# Patient Record
Sex: Male | Born: 1950 | Race: White | Hispanic: No | Marital: Married | Smoking: Never smoker
Health system: Southern US, Community
[De-identification: ages and names within clinical notes are randomized; demographics above are authoritative.]

## PROBLEM LIST (undated history)

## (undated) DIAGNOSIS — Z923 Personal history of irradiation: Secondary | ICD-10-CM

## (undated) DIAGNOSIS — R945 Abnormal results of liver function studies: Secondary | ICD-10-CM

## (undated) DIAGNOSIS — Z9109 Other allergy status, other than to drugs and biological substances: Secondary | ICD-10-CM

## (undated) DIAGNOSIS — C787 Secondary malignant neoplasm of liver and intrahepatic bile duct: Principal | ICD-10-CM

## (undated) DIAGNOSIS — L570 Actinic keratosis: Secondary | ICD-10-CM

## (undated) DIAGNOSIS — C259 Malignant neoplasm of pancreas, unspecified: Secondary | ICD-10-CM

## (undated) DIAGNOSIS — J84112 Idiopathic pulmonary fibrosis: Secondary | ICD-10-CM

## (undated) DIAGNOSIS — R7989 Other specified abnormal findings of blood chemistry: Secondary | ICD-10-CM

## (undated) DIAGNOSIS — M1990 Unspecified osteoarthritis, unspecified site: Secondary | ICD-10-CM

## (undated) DIAGNOSIS — N529 Male erectile dysfunction, unspecified: Secondary | ICD-10-CM

## (undated) DIAGNOSIS — K838 Other specified diseases of biliary tract: Secondary | ICD-10-CM

## (undated) HISTORY — DX: Idiopathic pulmonary fibrosis: J84.112

## (undated) HISTORY — DX: Actinic keratosis: L57.0

## (undated) HISTORY — DX: Malignant neoplasm of pancreas, unspecified: C25.9

## (undated) HISTORY — DX: Other allergy status, other than to drugs and biological substances: Z91.09

## (undated) HISTORY — DX: Abnormal results of liver function studies: R94.5

## (undated) HISTORY — DX: Other specified abnormal findings of blood chemistry: R79.89

## (undated) HISTORY — DX: Other specified diseases of biliary tract: K83.8

## (undated) HISTORY — PX: KNEE ARTHROSCOPY: SHX127

## (undated) HISTORY — DX: Unspecified osteoarthritis, unspecified site: M19.90

## (undated) HISTORY — PX: OTHER SURGICAL HISTORY: SHX169

## (undated) HISTORY — DX: Male erectile dysfunction, unspecified: N52.9

## (undated) HISTORY — DX: Secondary malignant neoplasm of liver and intrahepatic bile duct: C78.7

---

## 2012-05-19 ENCOUNTER — Ambulatory Visit: Payer: Self-pay | Admitting: Family Medicine

## 2012-05-26 ENCOUNTER — Ambulatory Visit: Payer: Self-pay | Admitting: Family Medicine

## 2012-06-02 ENCOUNTER — Ambulatory Visit: Payer: Self-pay | Admitting: Family Medicine

## 2012-06-22 ENCOUNTER — Ambulatory Visit (INDEPENDENT_AMBULATORY_CARE_PROVIDER_SITE_OTHER): Payer: 59 | Admitting: Emergency Medicine

## 2012-06-22 ENCOUNTER — Encounter: Payer: Self-pay | Admitting: Emergency Medicine

## 2012-06-22 ENCOUNTER — Other Ambulatory Visit: Payer: 59

## 2012-06-22 VITALS — BP 124/86 | HR 64 | Temp 97.6°F | Ht 70.0 in

## 2012-06-22 DIAGNOSIS — J8410 Pulmonary fibrosis, unspecified: Secondary | ICD-10-CM

## 2012-06-22 DIAGNOSIS — J849 Interstitial pulmonary disease, unspecified: Secondary | ICD-10-CM

## 2012-06-22 DIAGNOSIS — R0989 Other specified symptoms and signs involving the circulatory and respiratory systems: Secondary | ICD-10-CM

## 2012-06-22 NOTE — Progress Notes (Signed)
Subjective:    Patient ID: Roy English, male    DOB: 16-Apr-1951, 61 y.o.   MRN: [id number]  HPI 61 yo man, never smoker, little PMH except hx allergies. TDI exposure at work in March '13, treated for bronchitis.  He was treated for PNA in April '13 at an urgent care. Referred by Dr Ladona Ridgel from [facility] for abnormal CT scan of the chest. He had CXR at regular visit to his PCP in 10/13, no real sx, was told that he had PNA and sent for CT scan 06/02/12. He has had DOE since the exposure and original bronchitis. No real cough, f/c, etc. >> he did have infectious sx when he had the original PNA in April   Review of Systems  Constitutional: Negative for fever and unexpected weight change.  HENT: Positive for sore throat and postnasal drip. Negative for ear pain, nosebleeds, congestion, rhinorrhea, sneezing, trouble swallowing, dental problem and sinus pressure.   Eyes: Negative for redness and itching.  Respiratory: Positive for cough and shortness of breath. Negative for chest tightness and wheezing.   Cardiovascular: Negative for palpitations and leg swelling.  Gastrointestinal: Negative for nausea and vomiting.  Genitourinary: Negative for dysuria.  Musculoskeletal: Negative for joint swelling.  Skin: Negative for rash.  Neurological: Negative for headaches.  Hematological: Does not bruise/bleed easily.  Psychiatric/Behavioral: Negative for dysphoric mood. The patient is not nervous/anxious.    Past Medical History  Diagnosis Date  . Environmental allergies      Family History  Problem Relation Age of Onset  . Heart disease Mother   . Rheum arthritis Mother      History   Social History  . Marital Status: Unknown    Spouse Name: N/A    Number of Children: 2  . Years of Education: N/A   Occupational History  . 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 His most significant exposure is to TDI  through current job.  Used race dirt bikes. No [employer].   Social History Main Topics  . Smoking status: Never Smoker   . Smokeless tobacco: Never Used  . Alcohol Use: Yes     Comment: 1 beer daily  . Drug Use: No  . Sexually Active: Not on file   Other Topics Concern  . Not on file   Social History Narrative  . No narrative on file     Allergies  Allergen Reactions  . Penicillins      No outpatient prescriptions prior to visit.        Objective:   Physical Exam Filed Vitals:   06/22/12 1443  BP: 124/86  Pulse: 64  Temp: 97.6 F (36.4 C)   Gen: Pleasant, well-nourished, in no distress,  normal affect  ENT: No lesions,  mouth clear,  oropharynx clear, no postnasal drip  Neck: No JVD, no TMG, no carotid bruits  Lungs: No use of accessory muscles, no dullness to percussion, clear without rales or rhonchi  Cardiovascular: RRR, heart sounds normal, no murmur or gallops, no peripheral edema  Abdomen: soft and NT, no HSM,  BS normal  Musculoskeletal: No deformities, no cyanosis or clubbing  Neuro: alert, non focal  Skin: Warm, no lesions or rashes      Assessment & Plan:  Dyspnea on exertion Etiology unclear, but consider evolving pneumonitis from occupational exposure, asthma-like syndrome from exposure. Need to review the PFT and his films. Will  obtain these and follow up next available.   Interstitial lung disease I do not have the CT scan at this time, will obtain it. Report indicates some possible atx vs infiltrate vs interstiaial disease. Probably also consider dynamic hyperinflation. Will obtain the films and determine when to repeat. ? Whether this reflects pneumonitis from occupational exposure, consider residual infiltrate from his prior PNA - auto-immune labs today

## 2012-06-22 NOTE — Assessment & Plan Note (Signed)
Etiology unclear, but consider evolving pneumonitis from occupational exposure, asthma-like syndrome from exposure. Need to review the PFT and his films. Will obtain these and follow up next available.

## 2012-06-22 NOTE — Assessment & Plan Note (Addendum)
I do not have the CT scan at this time, will obtain it. Report indicates some possible atx vs infiltrate vs interstiaial disease. Probably also consider dynamic hyperinflation. Will obtain the films and determine when to repeat. ? Whether this reflects pneumonitis from occupational exposure, consider residual infiltrate from his prior PNA - auto-immune labs today

## 2012-06-22 NOTE — Patient Instructions (Addendum)
We will perform walking oximetry today We will obtain copies of all your CT scans, breathing tests and CXR's to review We will perform full pulmonary function testing at your next office visit Blood work today Follow with Dr Delton Coombes next available with full PFT

## 2012-06-23 ENCOUNTER — Encounter: Payer: Self-pay | Admitting: Emergency Medicine

## 2012-06-23 LAB — ANA: Anti Nuclear Antibody(ANA): NEGATIVE

## 2012-06-23 LAB — ANTI-SCLERODERMA ANTIBODY: Scleroderma (Scl-70) (ENA) Antibody, IgG: 2 AU/mL (ref <30)

## 2012-06-23 NOTE — Progress Notes (Signed)
Quick Note:  ATC no answer, LMOMTCB x1 ______

## 2012-06-26 ENCOUNTER — Telehealth: Payer: Self-pay | Admitting: Emergency Medicine

## 2012-06-26 NOTE — Telephone Encounter (Signed)
Notes Recorded by Leslye Peer, MD on 06/23/2012 at 11:50 AM Please let patient know that not all of his labs are back yet, but that the Rheumatoid Factor is elevated. We can discuss the significance of this further at his ROV. Thanks  I spoke with patient about results and he verbalized understanding and had no questions

## 2012-07-12 ENCOUNTER — Ambulatory Visit (INDEPENDENT_AMBULATORY_CARE_PROVIDER_SITE_OTHER): Payer: 59 | Admitting: Emergency Medicine

## 2012-07-12 ENCOUNTER — Encounter: Payer: Self-pay | Admitting: Emergency Medicine

## 2012-07-12 DIAGNOSIS — J849 Interstitial pulmonary disease, unspecified: Secondary | ICD-10-CM

## 2012-07-12 DIAGNOSIS — J8410 Pulmonary fibrosis, unspecified: Secondary | ICD-10-CM

## 2012-07-12 LAB — PULMONARY FUNCTION TEST

## 2012-07-12 NOTE — Progress Notes (Signed)
PFT done today. 

## 2012-07-27 ENCOUNTER — Ambulatory Visit (INDEPENDENT_AMBULATORY_CARE_PROVIDER_SITE_OTHER): Payer: 59 | Admitting: Emergency Medicine

## 2012-07-27 ENCOUNTER — Encounter: Payer: Self-pay | Admitting: Emergency Medicine

## 2012-07-27 VITALS — BP 116/78 | HR 77 | Temp 97.7°F | Ht 70.0 in | Wt 199.4 lb

## 2012-07-27 DIAGNOSIS — J849 Interstitial pulmonary disease, unspecified: Secondary | ICD-10-CM

## 2012-07-27 DIAGNOSIS — J8410 Pulmonary fibrosis, unspecified: Secondary | ICD-10-CM

## 2012-07-27 NOTE — Progress Notes (Signed)
  Subjective:    Patient ID: Roy English, male    DOB: 1951-01-06, 61 y.o.   MRN: [id number]  HPI 61 yo man, never smoker, little PMH except hx allergies. TDI exposure at work in March '13, treated for bronchitis.  He was treated for PNA in April '13 at an urgent care. Referred by Dr Ladona Ridgel from [facility] for abnormal CT scan of the chest. He had CXR at regular visit to his PCP in 10/13, no real sx, was told that he had PNA and sent for CT scan 06/02/12. He has had DOE since the exposure and original bronchitis. No real cough, f/c, etc. >> he did have infectious sx when he had the original PNA in April  ROV 07/27/12 -- returns for dyspnea and abnormal CT scan with exposure hx as above. Underwent PFT 12/11 >> primarily restriction with possible mixed disease, decreased DLCO that corrects for Va. His RF was elevated at 49, other inflammatory markers negative.    PULMONARY FUNCTON TEST 07/12/2012  FVC 2.67  FEV1 2  FEV1/FVC 74.9  FVC  % Predicted 58  FEV % Predicted 61  FeF 25-75 1.51  FeF 25-75 % Predicted 3.1       Objective:   Physical Exam Filed Vitals:   07/27/12 1454  BP: 116/78  Pulse: 77  Temp: 97.7 F (36.5 C)   Gen: Pleasant, well-nourished, in no distress,  normal affect  ENT: No lesions,  mouth clear,  oropharynx clear, no postnasal drip  Neck: No JVD, no TMG, no carotid bruits  Lungs: No use of accessory muscles, fine L crackles, more obvious crackles at R base, no wheezes  Cardiovascular: RRR, heart sounds normal, no murmur or gallops, no peripheral edema  Musculoskeletal: No deformities, no cyanosis or clubbing  Neuro: alert, non focal  Skin: Warm, no lesions or rashes      Assessment & Plan:  Interstitial lung disease PFT's show primarily restriction. Suspect that his ILD is the cause of his dyspnea. ? The cause >> RF is positive, also had occupational exposure to TDI.  - repeat Ct scan chest now - wil likely decide to proceed with  therapeutic trial pred next visit - consider rheum referral - consider VATS bx - rov next available

## 2012-07-27 NOTE — Patient Instructions (Addendum)
We will perform a CT scan of your chest  Follow with Dr Delton Coombes next available after the CT scan is done

## 2012-07-27 NOTE — Assessment & Plan Note (Signed)
PFT's show primarily restriction. Suspect that his ILD is the cause of his dyspnea. ? The cause >> RF is positive, also had occupational exposure to TDI.  - repeat Ct scan chest now - wil likely decide to proceed with therapeutic trial pred next visit - consider rheum referral - consider VATS bx - rov next available

## 2012-07-28 ENCOUNTER — Ambulatory Visit (INDEPENDENT_AMBULATORY_CARE_PROVIDER_SITE_OTHER)
Admission: RE | Admit: 2012-07-28 | Discharge: 2012-07-28 | Disposition: A | Discharge status: Home / Self Care | Payer: 59 | Source: Ambulatory Visit | Attending: Emergency Medicine | Admitting: Emergency Medicine

## 2012-07-28 DIAGNOSIS — J849 Interstitial pulmonary disease, unspecified: Secondary | ICD-10-CM

## 2012-07-28 DIAGNOSIS — J8410 Pulmonary fibrosis, unspecified: Secondary | ICD-10-CM

## 2012-08-01 ENCOUNTER — Other Ambulatory Visit: Payer: Self-pay | Admitting: *Deleted

## 2012-08-01 MED ORDER — PREDNISONE 10 MG PO TABS: ORAL_TABLET | ORAL | Status: DC

## 2012-08-01 NOTE — Telephone Encounter (Signed)
Pt aware pred has been sent to verified pharmacy. Nothing further needed at this time.

## 2012-08-01 NOTE — Progress Notes (Signed)
Quick Note:  Spoke with patient, informed him of results and recs as listed below per RB. Medication called in to [facility] [address] [address] and pt has appt schedule for Aug 17, 2012. ______

## 2012-08-03 ENCOUNTER — Telehealth: Payer: Self-pay | Admitting: Emergency Medicine

## 2012-08-03 NOTE — Telephone Encounter (Signed)
If he has already had one, he doesn';t need another until age 62. If he hasn't had one, then you can call in the script to the pharmacy.

## 2012-08-03 NOTE — Telephone Encounter (Signed)
LMTCB

## 2012-08-03 NOTE — Telephone Encounter (Signed)
Called left detailed message on named voice mail informing pt of RB's response as stated below.  Asked to please call back to verify whether or not he's ever had a pneumonia vaccine; if not, we'll be happy to send in this rx.

## 2012-08-03 NOTE — Telephone Encounter (Signed)
Returning call can be reached at [phone].Roy English

## 2012-08-03 NOTE — Telephone Encounter (Signed)
Pt is requesting an RX be sent to his pharmacy for pneumonia vaccine. Please advise if ok to order. Carron Curie, CMA

## 2012-08-07 MED ORDER — PNEUMOCOCCAL VAC POLYVALENT 25 MCG/0.5ML IJ INJ: 0.5000 mL | INJECTION | Freq: Once | INTRAMUSCULAR | Status: DC

## 2012-08-07 NOTE — Telephone Encounter (Signed)
Pt states he has never received a pneumonia vaccine and would like this sent to his pharmacy. RX has been sent.

## 2012-08-17 ENCOUNTER — Ambulatory Visit (INDEPENDENT_AMBULATORY_CARE_PROVIDER_SITE_OTHER): Payer: 59 | Admitting: Emergency Medicine

## 2012-08-17 ENCOUNTER — Encounter: Payer: Self-pay | Admitting: Emergency Medicine

## 2012-08-17 VITALS — BP 118/72 | HR 70 | Temp 97.3°F | Ht 70.0 in | Wt 201.6 lb

## 2012-08-17 DIAGNOSIS — J849 Interstitial pulmonary disease, unspecified: Secondary | ICD-10-CM

## 2012-08-17 DIAGNOSIS — J8410 Pulmonary fibrosis, unspecified: Secondary | ICD-10-CM

## 2012-08-17 MED ORDER — PREDNISONE 20 MG PO TABS: 20.0000 mg | ORAL_TABLET | Freq: Every day | ORAL | Status: DC

## 2012-08-17 NOTE — Assessment & Plan Note (Addendum)
Etiology unclear, but he seems to be clinically reversible. His RF is positive and RA runs in his family.  - will continue pred 40 x 4 weeks, then wean quickly to zero.  - rov with CXR in 58month - will needs CT scan to look for interval improvement at some point - consider rheum referral

## 2012-08-17 NOTE — Progress Notes (Signed)
Subjective:    Patient ID: Roy English, male    DOB: Jan 07, 1951, 62 y.o.   MRN: [id number]  HPI 62 yo man, never smoker, little PMH except hx allergies. TDI exposure at work in March '13, treated for bronchitis.  He was treated for PNA in April '13 at an urgent care. Referred by Dr Ladona Ridgel from [facility] for abnormal CT scan of the chest. He had CXR at regular visit to his PCP in 10/13, no real sx, was told that he had PNA and sent for CT scan 06/02/12. He has had DOE since the exposure and original bronchitis. No real cough, f/c, etc. >> he did have infectious sx when he had the original PNA in April  ROV 07/27/12 -- returns for dyspnea and abnormal CT scan with exposure hx as above. Underwent PFT 12/11 >> primarily restriction with possible mixed disease, decreased DLCO that corrects for Va. His RF was elevated at 49, other inflammatory markers negative.    PULMONARY FUNCTON TEST 07/12/2012  FVC 2.67  FEV1 2  FEV1/FVC 74.9  FVC  % Predicted 58  FEV % Predicted 61  FeF 25-75 1.51  FeF 25-75 % Predicted 3.1    ROV 08/17/12 -- f/u for SOB, ILD with positive RF. We repeated CT scan on 12/27, decided based on results to start Pred 40mg . He returns today reporting that his breathing is better, able to exercise more. He has a lot more energy.      Objective:   Physical Exam Filed Vitals:   08/17/12 1442  BP: 118/72  Pulse: 70  Temp: 97.3 F (36.3 C)   Gen: Pleasant, well-nourished, in no distress,  normal affect  ENT: No lesions,  mouth clear,  oropharynx clear, no postnasal drip  Neck: No JVD, no TMG, no carotid bruits  Lungs: No use of accessory muscles, fine L crackles, more obvious crackles at R base, no wheezes  Cardiovascular: RRR, heart sounds normal, no murmur or gallops, no peripheral edema  Musculoskeletal: No deformities, no cyanosis or clubbing  Neuro: alert, non focal  Skin: Warm, no lesions or rashes    CT scan 08/01/12 --  Comparison: No  priors.  Findings:  Mediastinum: Heart size is borderline enlarged. There is no  significant pericardial fluid, thickening or pericardial  calcification. There is atherosclerosis of the thoracic aorta, the  great vessels of the mediastinum and the coronary arteries,  including calcified atherosclerotic plaque in the left main, left  anterior descending, left circumflex and right coronary arteries.  No pathologically enlarged mediastinal or hilar lymph nodes. Please  note that accurate exclusion of hilar adenopathy is limited on  noncontrast CT scans. Esophagus is unremarkable in appearance.  Lungs/Pleura: There are patchy areas of peripheral predominant  ground-glass attenuation seen scattered throughout the lungs  bilaterally, which are basilar predominant with the exception of an  area of involvement in the periphery of the right upper lobe.  These areas are associated with peribronchovascular interstitial  thickening. There is a small amount of associated subpleural  reticulation, however, no frank honeycombing is identified. Mild  diffuse bronchial wall thickening, but no definite traction  bronchiectasis is noted. No acute consolidative airspace disease.  No definite suspicious appearing pulmonary nodules or masses. No  pleural effusions. Inspiratory and expiratory high resolution  imaging is unremarkable.  Upper Abdomen: Unremarkable.  Musculoskeletal: Multiple old healed posterior right-sided rib  fractures are noted. There are no aggressive appearing lytic or  blastic lesions noted in the visualized  portions of the skeleton.  IMPRESSION:  1. 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  2. Atherosclerosis, including left main and three-vessel coronary  artery disease. Please note that although the presence of coronary  artery  calcium documents the presence of coronary artery disease,  the severity of this disease and any potential stenosis cannot be  assessed on this non-gated CT examination. Assessment for  potential risk factor modification, dietary therapy or  pharmacologic therapy may be warranted, if clinically indicated.      Assessment & Plan:  Interstitial lung disease Etiology unclear, but he seems to be clinically reversible. His RF is positive and RA runs in his family.  - will continue pred 40 x 4 weeks, then wean quickly to zero.  - rov with CXR in 29month - will needs CT scan to look for interval improvement at some point - consider rheum referral

## 2012-08-17 NOTE — Addendum Note (Signed)
Addended by: Orma Flaming D on: 08/17/2012 03:28 PM   Modules accepted: Orders

## 2012-08-17 NOTE — Patient Instructions (Addendum)
Please continue your prednisone 40mg  for a total of 4 weeks, then decrease to 20mg  for 10 days, then stop.  Please follow up with Dr Delton Coombes in 1 month with a CXR

## 2012-09-04 ENCOUNTER — Telehealth: Payer: Self-pay | Admitting: Emergency Medicine

## 2012-09-04 NOTE — Telephone Encounter (Signed)
Called, spoke with pt. C/o catching a "cold" and generally "not feeling good."  Reports prod cough with clear to yellow mucus and increased SOB yesterday.  Denies wheezing, chest tightness, chest pain, f/c/s, or body aches.  Took mucinex dm yesterday but felt "nervous and shaky" after taking it.  Started prednisone at 20 mg qd today x 10 days.  Was last seen by RB on 08/17/12 and asked to f/u in 1 month with cxr.  He has a pending OV with RB on 09/14/12 at 2:30.  Offered OV today but would like to wait if possible for OV next week.  Would like recs in the meantime. Dr. Delton Coombes, pls advise.  Thank you.   [facility] [place]  Allergies verified with pt: Allergies  Allergen Reactions  . Penicillins

## 2012-09-04 NOTE — Telephone Encounter (Signed)
Pt is aware of RB recs. He will cal [place] on Wednesday to let us know how he is feeling.

## 2012-09-04 NOTE — Telephone Encounter (Signed)
Ask him to continue the pred 20 for now, follow his symptoms for 1-2 more days. If he continues to feel bad then I would like to see him early. Have him call us back to report. He can use other decongestants if the ones he has tried give him side effects. Try generic that contain chlorpheniramine or brompheniramine

## 2012-09-06 ENCOUNTER — Telehealth: Payer: Self-pay | Admitting: Emergency Medicine

## 2012-09-06 ENCOUNTER — Ambulatory Visit (INDEPENDENT_AMBULATORY_CARE_PROVIDER_SITE_OTHER): Payer: 59 | Admitting: Internal Medicine

## 2012-09-06 ENCOUNTER — Encounter: Payer: Self-pay | Admitting: Internal Medicine

## 2012-09-06 VITALS — BP 120/78 | HR 73 | Temp 98.1°F | Ht 70.0 in | Wt 201.4 lb

## 2012-09-06 DIAGNOSIS — J069 Acute upper respiratory infection, unspecified: Secondary | ICD-10-CM

## 2012-09-06 DIAGNOSIS — J849 Interstitial pulmonary disease, unspecified: Secondary | ICD-10-CM

## 2012-09-06 DIAGNOSIS — J8410 Pulmonary fibrosis, unspecified: Secondary | ICD-10-CM

## 2012-09-06 MED ORDER — TRAMADOL HCL 50 MG PO TABS: ORAL_TABLET | ORAL | Status: DC

## 2012-09-06 NOTE — Telephone Encounter (Signed)
I spoke with the pt and he states that he is feeling worse since he called in on 2-314. He is having a productive cough with yellow phlegm, headache, fever 101.5. He was advised on 09-04-12 to continue prednisone at 20mg  and to take chlorpheniramine as well. Pt states he has done this but he feels worse.  Per recs from phone call on 09-04-12 Dr. Delton Coombes advised the pt to call back in 1-2 days and if no better to be seen sooner then schedule visit. Appt set for today at 3:45 with Dr. Sherene Sires. Carron Curie, CMA

## 2012-09-06 NOTE — Patient Instructions (Signed)
Stay on 20 prednisone per day for now Take avelox 400 mg one now and daily for 7 days total For cough plain mucinex or robitussin and supplement with tramdol 50 mg one every 4 hours   Keep appt with Dr Rybum

## 2012-09-06 NOTE — Progress Notes (Signed)
Subjective:    Patient ID: Roy English, male    DOB: 1951-07-08    MRN: [id number]  HPI 89 yowm never smoker, little PMH except hx allergies. TDI exposure at work in March '13, treated for bronchitis.  He was treated for PNA in April '13 at an urgent care. Referred by Dr Ladona Ridgel from [facility] for abnormal CT scan of the chest. He had CXR at regular visit to his PCP in 10/13, no real sx, was told that he had PNA and sent for CT scan 06/02/12. He has had DOE since the exposure and original bronchitis. No real cough, f/c, etc. >> he did have infectious sx when he had the original PNA in April  ROV 07/27/12 -- returns for dyspnea and abnormal CT scan with exposure hx as above. Underwent PFT 12/11 >> primarily restriction with possible mixed disease, decreased DLCO that corrects for Va. His RF was elevated at 49, other inflammatory markers negative.    PULMONARY FUNCTON TEST 07/12/2012  FVC 2.67  FEV1 2  FEV1/FVC 74.9  FVC  % Predicted 58  FEV % Predicted 61  FeF 25-75 1.51  FeF 25-75 % Predicted 3.1    ROV 08/17/12 -- f/u for SOB, ILD with positive RF. We repeated CT scan on 12/27, decided based on results to start Pred 40mg .  Cc  breathing is better, able to exercise more. He has a lot more energy.  rec decrease pred to 20 mg per day   09/06/2012 acute w/in ov/Ellean Firman cc 2/1 onset chills, fever 101.5 on pred 20 mg with mucus yellow  assoc  sob.chest tightness. Headache but no arthralgias, myalgias, rigors, or resting sob n or v.  No obvious daytime variabilty or assoc  subjective wheeze overt sinus or hb symptoms. No unusual exp hx  At baseline Sleeping ok without nocturnal  or early am exacerbation  of respiratory  c/o's or need for noct saba. Also denies any obvious fluctuation of symptoms with weather or environmental changes or other aggravating or alleviating factors except as outlined above   ROS  The following are not active complaints unless bolded sore throat, dysphagia,  dental problems, itching, sneezing,  nasal congestion or excess/ purulent secretions, ear ache,   fever, chills, sweats, unintended wt loss, pleuritic or exertional cp, hemoptysis,  orthopnea pnd or leg swelling, presyncope, palpitations, heartburn, abdominal pain, anorexia, nausea, vomiting, diarrhea  or change in bowel or urinary habits, change in stools or urine, dysuria,hematuria,  rash, arthralgias, visual complaints, headache, numbness weakness or ataxia or problems with walking or coordination,  change in mood/affect or memory.       Objective:   Physical Exam  Wt Readings from Last 3 Encounters:  09/06/12 201 lb 6.4 oz (91.354 kg)  08/17/12 201 lb 9.6 oz (91.445 kg)  07/27/12 199 lb 6.4 oz (90.447 kg)    Gen: Pleasant, well-nourished, in no distress,  normal affect  ENT: No lesions,  mouth clear,  oropharynx clear, no postnasal drip  Neck: No JVD, no TMG, no carotid bruits  Lungs: No use of accessory muscles, fine L crackles, min crackles at both bases, no wheezes  Cardiovascular: RRR, heart sounds normal, no murmur or gallops, no peripheral edema  Musculoskeletal: No deformities, no cyanosis or clubbing  Neuro: alert, non focal  Skin: Warm, no lesions or rashes     ct chest  07/28/12   IMPRESSION:  1. 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  2. Atherosclerosis, including left main and three-vessel coronary  artery disease. Please note that although the presence of coronary  artery calcium documents the presence of coronary artery disease,  the severity of this disease and any potential stenosis cannot be  assessed on this non-gated CT examination. Assessment for  potential risk factor modification, dietary therapy or  pharmacologic therapy may be warranted, if clinically indicated.      Assessment & Plan:

## 2012-09-07 DIAGNOSIS — J069 Acute upper respiratory infection, unspecified: Secondary | ICD-10-CM | POA: Insufficient documentation

## 2012-09-07 NOTE — Assessment & Plan Note (Signed)
Given his ILD/ steroid dep this could be early pna with symptoms masked by steroids so rx = avelox 400 mg now and daily x 7days, recheck in 7days if not better, rec he call us in meantime if worsens

## 2012-09-07 NOTE — Assessment & Plan Note (Addendum)
Relatively responsive to prednisone > continue 20 mg per day until ov as planned with Dr Delton Coombes 09/14/12

## 2012-09-14 ENCOUNTER — Ambulatory Visit: Payer: 59 | Admitting: Emergency Medicine

## 2012-09-15 ENCOUNTER — Ambulatory Visit: Payer: 59 | Admitting: Emergency Medicine

## 2012-09-18 ENCOUNTER — Telehealth: Payer: Self-pay | Admitting: Emergency Medicine

## 2012-09-18 NOTE — Telephone Encounter (Signed)
Called, spoke with pt.  Pt was scheduled for OV with RB last week but it was cancelled d/t weather.  Pt states he has been off prednisone x 1 1/2 wks (he ran out of rx).  Reports SOB is unchanged.  Reports a rattling in chest - worse qhs, has soreness in right, front chest, "constant" PND, and cough with a small amount of clear mucus.  First available with RB is March 6.  Pt wants to be seen sooner than this and would like to see Dr. Delton Coombes.  RB, pls advise if pt can be worked in with you next week.

## 2012-09-20 NOTE — Telephone Encounter (Signed)
LMOMTCB x 1 

## 2012-09-20 NOTE — Telephone Encounter (Signed)
Overbook ok with me

## 2012-09-21 ENCOUNTER — Telehealth: Payer: Self-pay | Admitting: Emergency Medicine

## 2012-09-21 NOTE — Telephone Encounter (Signed)
I spoke with pt. He states he would like to be worked in to see RB next week and not in the middle of march (next available). He was in to see MW for acute visit 09/06/12 and was suppose to f/u with RB on 09/15/12 but appt had to be cancelled d/t the weather. He is still coughing and having some sinus issues. Please advise if pt can be worked in next week RB thanks.

## 2012-09-21 NOTE — Telephone Encounter (Signed)
See other note dated 2/17  The pt is scheduled with RB for 09/25/12

## 2012-09-21 NOTE — Telephone Encounter (Signed)
Spoke with pt and scheduled appt with RB for Monday 2/24 at 1: 30 Nothing further needed

## 2012-09-25 ENCOUNTER — Encounter: Payer: Self-pay | Admitting: Emergency Medicine

## 2012-09-25 ENCOUNTER — Ambulatory Visit (INDEPENDENT_AMBULATORY_CARE_PROVIDER_SITE_OTHER)
Admission: RE | Admit: 2012-09-25 | Discharge: 2012-09-25 | Disposition: A | Discharge status: Home / Self Care | Payer: 59 | Source: Ambulatory Visit | Attending: Emergency Medicine | Admitting: Emergency Medicine

## 2012-09-25 ENCOUNTER — Ambulatory Visit (INDEPENDENT_AMBULATORY_CARE_PROVIDER_SITE_OTHER): Payer: 59 | Admitting: Emergency Medicine

## 2012-09-25 VITALS — BP 120/80 | HR 96 | Temp 98.0°F | Ht 70.0 in | Wt 200.6 lb

## 2012-09-25 DIAGNOSIS — J8410 Pulmonary fibrosis, unspecified: Secondary | ICD-10-CM

## 2012-09-25 DIAGNOSIS — J329 Chronic sinusitis, unspecified: Secondary | ICD-10-CM | POA: Insufficient documentation

## 2012-09-25 MED ORDER — CLINDAMYCIN HCL 300 MG PO CAPS: 300.0000 mg | ORAL_CAPSULE | Freq: Four times a day (QID) | ORAL | Status: DC

## 2012-09-25 NOTE — Assessment & Plan Note (Signed)
Has R basilar crackles on exam, L resolved. Off pred - CXR today - Ct scan to compare with priors, will wait until treated with clinda to see if we can resolve this episode before scanning.  - rov 3-4 weeks

## 2012-09-25 NOTE — Assessment & Plan Note (Signed)
Sx consistent w a sinusitis, better while on avelox but now returned. ? The prednisone set him up for an infxn - start saline rinses - nasal steroid  - clinda x 6 weeks - rov 3 -4 weeks

## 2012-09-25 NOTE — Progress Notes (Signed)
Subjective:    Patient ID: Roy English, male    DOB: [birth year]    MRN: [id number]  HPI 15 yowm never smoker, little PMH except hx allergies. TDI exposure at work in March '13, treated for bronchitis.  He was treated for PNA in April '13 at an urgent care. Referred by Dr Ladona Ridgel from [facility] for abnormal CT scan of the chest. He had CXR at regular visit to his PCP in 10/13, no real sx, was told that he had PNA and sent for CT scan 06/02/12. He has had DOE since the exposure and original bronchitis. No real cough, f/c, etc. >> he did have infectious sx when he had the original PNA in April  ROV 07/27/12 -- returns for dyspnea and abnormal CT scan with exposure hx as above. Underwent PFT 12/11 >> primarily restriction with possible mixed disease, decreased DLCO that corrects for [place]. His RF was elevated at 49, other inflammatory markers negative.    PULMONARY FUNCTON TEST 07/12/2012  FVC 2.67  FEV1 2  FEV1/FVC 74.9  FVC  % Predicted 58  FEV % Predicted 61  FeF 25-75 1.51  FeF 25-75 % Predicted 3.1    ROV 08/17/12 -- f/u for SOB, ILD with positive RF. We repeated CT scan on 12/27, decided based on results to start Pred 40mg .  Cc  breathing is better, able to exercise more. He has a lot more energy.  rec decrease pred to 20 mg per day  09/06/12 -- acute w/in ov/Wert cc 2/1 onset chills, fever 101.5 on pred 20 mg with mucus yellow  assoc  sob.chest tightness. Headache but no arthralgias, myalgias, rigors, or resting sob n or v.  ROV 09/25/12 -- f/u for SOB, ILD with positive RF. He was seen by Dr Sherene Sires as above for nasal congestion, sweats, HA, cough and chest mucous. Was treated as possible PNA/bronchitis w avelox. For a period he felt better, then had recurrence of these sx. He is waking up at night to cough. He finished prednisone mid February. Returns today c/o same nasal and sinus sx, chest complaints, yellow mucous.        Objective:   Physical Exam  Wt Readings from Last 3  Encounters:  09/25/12 200 lb 9.6 oz (90.992 kg)  09/06/12 201 lb 6.4 oz (91.354 kg)  08/17/12 201 lb 9.6 oz (91.445 kg)   Filed Vitals:   09/25/12 1325  BP: 120/80  Pulse: 96  Temp: 98 F (36.7 C)    Gen: Pleasant, well-nourished, in no distress,  normal affect  ENT: No lesions,  mouth clear,  oropharynx clear, no postnasal drip  Neck: No JVD, no TMG, no carotid bruits  Lungs: No use of accessory muscles, fine L crackles, min crackles at both bases, no wheezes  Cardiovascular: RRR, heart sounds normal, no murmur or gallops, no peripheral edema  Musculoskeletal: No deformities, no cyanosis or clubbing  Neuro: alert, non focal  Skin: Warm, no lesions or rashes   ct chest  07/28/12 IMPRESSION:  1. 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  2. Atherosclerosis, including left main and three-vessel coronary  artery disease. Please note that although the presence of coronary  artery calcium documents the presence of coronary artery disease,  the severity of this disease and any potential stenosis cannot be  assessed on this non-gated CT examination. Assessment for  potential risk factor modification, dietary therapy or  pharmacologic therapy may be warranted, if clinically indicated.   Assessment & Plan:  Sinusitis, chronic Sx consistent w a sinusitis, better while on avelox but now returned. ? The prednisone set him up for an infxn - start saline rinses - nasal steroid  - clinda x 6 weeks - rov 3 -4 weeks  Interstitial lung disease Has R basilar crackles on exam, L resolved. Off pred - CXR today - Ct scan to compare with priors, will wait until treated with clinda to see if we can resolve this episode before scanning.  - rov 3-4 weeks

## 2012-09-25 NOTE — Patient Instructions (Addendum)
Start nasal saline washes daily Start nasonex 2 sprays each nostril daily Start clindamycin 300mg  four times a day CXR today We will discuss the timing of a repeat Ct scan at your next office visit Follow with Dr Delton Coombes in 3 - 4 weeks or sooner if you have any problems.

## 2012-10-17 ENCOUNTER — Ambulatory Visit (INDEPENDENT_AMBULATORY_CARE_PROVIDER_SITE_OTHER): Payer: 59 | Admitting: Emergency Medicine

## 2012-10-17 ENCOUNTER — Encounter: Payer: Self-pay | Admitting: Emergency Medicine

## 2012-10-17 VITALS — BP 122/78 | HR 66 | Temp 97.3°F | Ht 70.0 in | Wt 203.0 lb

## 2012-10-17 DIAGNOSIS — J329 Chronic sinusitis, unspecified: Secondary | ICD-10-CM

## 2012-10-17 NOTE — Assessment & Plan Note (Signed)
Appears stable clinically and by CXR. Discussed course, potential for flares etc

## 2012-10-17 NOTE — Progress Notes (Signed)
Subjective:    Patient ID: Roy English, male    DOB: 12-25-1950    MRN: [id number]  HPI 81 yowm never smoker, little PMH except hx allergies. TDI exposure at work in March '13, treated for bronchitis.  He was treated for PNA in April '13 at an urgent care. Referred by Dr Ladona Ridgel from [facility] for abnormal CT scan of the chest. He had CXR at regular visit to his PCP in 10/13, no real sx, was told that he had PNA and sent for CT scan 06/02/12. He has had DOE since the exposure and original bronchitis. No real cough, f/c, etc. >> he did have infectious sx when he had the original PNA in April  ROV 07/27/12 -- returns for dyspnea and abnormal CT scan with exposure hx as above. Underwent PFT 12/11 >> primarily restriction with possible mixed disease, decreased DLCO that corrects for [place]. His RF was elevated at 49, other inflammatory markers negative.    PULMONARY FUNCTON TEST 07/12/2012  FVC 2.67  FEV1 2  FEV1/FVC 74.9  FVC  % Predicted 58  FEV % Predicted 61  FeF 25-75 1.51  FeF 25-75 % Predicted 3.1    ROV 08/17/12 -- f/u for SOB, ILD with positive RF. We repeated CT scan on 12/27, decided based on results to start Pred 40mg .  Cc  breathing is better, able to exercise more. He has a Roy more energy.  rec decrease pred to 20 mg per day  09/06/12 -- acute w/in ov/Wert cc 2/1 onset chills, fever 101.5 on pred 20 mg with mucus yellow  assoc  sob.chest tightness. Headache but no arthralgias, myalgias, rigors, or resting sob n or v.  ROV 09/25/12 -- f/u for SOB, ILD with positive RF. He was seen by Dr Sherene Sires as above for nasal congestion, sweats, HA, cough and chest mucous. Was treated as possible PNA/bronchitis w avelox. For a period he felt better, then had recurrence of these sx. He is waking up at night to cough. He finished prednisone mid February. Returns today c/o same nasal and sinus sx, chest complaints, yellow mucous.   ROV 10/17/12 -- f/u for SOB, ILD with positive RF. Treated last  time for chronic sinusitis (clinda + [place] >> he never these), he is 3 weeks into this. Drainage is better, still rare sinus pressure. Finished the nasonex.         Objective:   Physical Exam  Wt Readings from Last 3 Encounters:  10/17/12 203 lb (92.08 kg)  09/25/12 200 lb 9.6 oz (90.992 kg)  09/06/12 201 lb 6.4 oz (91.354 kg)   Filed Vitals:   10/17/12 1543  BP: 122/78  Pulse: 66  Temp: 97.3 F (36.3 C)    Gen: Pleasant, well-nourished, in no distress,  normal affect  ENT: No lesions,  mouth clear,  oropharynx clear, no postnasal drip  Neck: No JVD, no TMG, no carotid bruits  Lungs: No use of accessory muscles, fine L crackles, min crackles at both bases, no wheezes  Cardiovascular: RRR, heart sounds normal, no murmur or gallops, no peripheral edema  Musculoskeletal: No deformities, no cyanosis or clubbing  Neuro: alert, non focal  Skin: Warm, no lesions or rashes   ct chest  07/28/12 IMPRESSION:  1. 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  2. Atherosclerosis, including left main and three-vessel coronary  artery  disease. Please note that although the presence of coronary  artery calcium documents the presence of coronary artery disease,  the severity of this disease and any potential stenosis cannot be  assessed on this non-gated CT examination. Assessment for  potential risk factor modification, dietary therapy or  pharmacologic therapy may be warranted, if clinically indicated.   Assessment & Plan:  Sinusitis, chronic Finish 3 more weeks clinda  Interstitial lung disease Appears stable clinically and by CXR. Discussed course, potential for flares etc  Allergic rhinitis Will start regimen if he flares this Spring. Discussed possible regimen w him today

## 2012-10-17 NOTE — Patient Instructions (Addendum)
Please continue your clindamycin for another 3 weeks.  If your allergies begin to flare this Spring, you should consider starting an allergy regimen - nasal washes, or nasal steroid spray and/or an allergy pill (like claritin or zyrtec).  Follow with Dr Delton Coombes in 6 months or sooner if you have any problems

## 2012-10-17 NOTE — Assessment & Plan Note (Signed)
Finish 3 more weeks clinda

## 2012-10-17 NOTE — Assessment & Plan Note (Signed)
Will start regimen if he flares this Spring. Discussed possible regimen w him today

## 2012-10-23 ENCOUNTER — Other Ambulatory Visit: Payer: Self-pay | Admitting: Emergency Medicine

## 2012-11-22 NOTE — Telephone Encounter (Signed)
Spoke with patient, patient states that he has taken this antibiotic x 6weeks and still has some left over. Per patient Has not had them nor needed them refilled since last visit? Patient states he feels his sinus infection has been over. Patient however is c/o stomach ache and diarrhea x 5days-- I have advised patient to contact his PCP for this symptom management. Patient verbalized understanding-- and nothing further needed.

## 2013-04-04 ENCOUNTER — Telehealth: Payer: Self-pay | Admitting: Emergency Medicine

## 2013-04-04 NOTE — Telephone Encounter (Signed)
left messages for pt to call back to schedule follow up apt. No return calls back. Sent letter 04/04/13 ° °

## 2015-07-07 ENCOUNTER — Other Ambulatory Visit: Payer: Self-pay | Admitting: Family Medicine

## 2015-07-10 ENCOUNTER — Ambulatory Visit (INDEPENDENT_AMBULATORY_CARE_PROVIDER_SITE_OTHER): Payer: 59 | Admitting: Family Medicine

## 2015-07-10 ENCOUNTER — Other Ambulatory Visit: Payer: Self-pay

## 2015-07-10 ENCOUNTER — Encounter: Payer: Self-pay | Admitting: Family Medicine

## 2015-07-10 VITALS — BP 103/66 | HR 61 | Temp 97.8°F | Ht 68.3 in | Wt 169.0 lb

## 2015-07-10 DIAGNOSIS — J849 Interstitial pulmonary disease, unspecified: Secondary | ICD-10-CM

## 2015-07-10 DIAGNOSIS — Z23 Encounter for immunization: Secondary | ICD-10-CM

## 2015-07-10 DIAGNOSIS — E7800 Pure hypercholesterolemia, unspecified: Secondary | ICD-10-CM | POA: Diagnosis not present

## 2015-07-10 MED ORDER — ATORVASTATIN CALCIUM 40 MG PO TABS: 40.0000 mg | ORAL_TABLET | Freq: Every day | ORAL | Status: DC

## 2015-07-10 MED ORDER — SILDENAFIL CITRATE 100 MG PO TABS: 100.0000 mg | ORAL_TABLET | Freq: Every day | ORAL | Status: DC | PRN

## 2015-07-10 NOTE — Progress Notes (Signed)
   BP 103/66 mmHg  Pulse 61  Temp(Src) 97.8 F (36.6 C)  Ht 5' 8.3" (1.735 m)  Wt 169 lb (76.658 kg)  BMI 25.47 kg/m2  SpO2 99%   Subjective:    Patient ID: Roy English, male    DOB: [birth year], 64 y.o.   MRN: [id number]  HPI: Roy English is a 64 y.o. male  Chief Complaint  Patient presents with  . Hyperlipidemia   Patient follow-up hypercholesterol taking atorvastatin without problems has been taking medicines faithfully with no side effects. 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  Relevant past medical, surgical, family and social history reviewed and updated as indicated. Interim medical history since our last visit reviewed. Allergies and medications reviewed and updated.  Review of Systems  Constitutional: Negative.   Respiratory: Negative.   Cardiovascular: Negative.     Per HPI unless specifically indicated above     Objective:    BP 103/66 mmHg  Pulse 61  Temp(Src) 97.8 F (36.6 C)  Ht 5' 8.3" (1.735 m)  Wt 169 lb (76.658 kg)  BMI 25.47 kg/m2  SpO2 99%  Wt Readings from Last 3 Encounters:  07/10/15 169 lb (76.658 kg)  07/15/14 204 lb (92.534 kg)  10/17/12 203 lb (92.08 kg)    Physical Exam  Constitutional: He is oriented to person, place, and time. He appears well-developed and well-nourished. No distress.  HENT:  Head: Normocephalic and atraumatic.  Right Ear: Hearing normal.  Left Ear: Hearing normal.  Nose: Nose normal.  Eyes: Conjunctivae and lids are normal. Right eye exhibits no discharge. Left eye exhibits no discharge. No scleral icterus.  Cardiovascular: Normal rate, regular rhythm and normal heart sounds.   Pulmonary/Chest: Effort normal. No respiratory distress.  Patient's breath sounds sound good with no rales  Musculoskeletal: Normal range of motion.  Neurological: He is alert and oriented to person,  place, and time.  Skin: Skin is intact. No rash noted.  Multiple actinic keratosis works with dermatology  Psychiatric: He has a normal mood and affect. His speech is normal and behavior is normal. Judgment and thought content normal. Cognition and memory are normal.        Assessment & Plan:   Problem List Items Addressed This Visit      Respiratory   Interstitial lung disease ([place])    Stable      Relevant Orders   Basic metabolic panel     Other   Hypercholesterolemia    Doing well on atorvastatin with no complaints The current medical regimen is effective;  continue present plan and medications.       Relevant Medications   atorvastatin (LIPITOR) 40 MG tablet   sildenafil (VIAGRA) 100 MG tablet   Other Relevant Orders   Lipid panel   ALT   ALT (SGPT) Piccolo, Waived    Other Visit Diagnoses    Immunization due    -  Primary    Relevant Orders    Flu Vaccine QUAD 36+ mos PF IM (Fluarix & Fluzone Quad PF) (Completed)        Follow up plan: Return for Physical Exam 2-3 mo.

## 2015-07-10 NOTE — Assessment & Plan Note (Signed)
Doing well on atorvastatin with no complaints The current medical regimen is effective;  continue present plan and medications.

## 2015-07-10 NOTE — Assessment & Plan Note (Signed)
Stable

## 2015-07-11 LAB — BASIC METABOLIC PANEL
BUN/Creatinine Ratio: 19 (ref 10–22)
BUN: 18 mg/dL (ref 8–27)
CO2: 28 mmol/L (ref 18–29)
CREATININE: 0.97 mg/dL (ref 0.76–1.27)
Calcium: 9.1 mg/dL (ref 8.6–10.2)
Chloride: 99 mmol/L (ref 97–106)
GFR calc Af Amer: 96 mL/min/{1.73_m2} (ref >59)
GFR, EST NON AFRICAN AMERICAN: 83 mL/min/{1.73_m2} (ref >59)
Glucose: 101 mg/dL — ABNORMAL HIGH (ref 65–99)
Potassium: 4 mmol/L (ref 3.5–5.2)
SODIUM: 142 mmol/L (ref 136–144)

## 2015-07-11 LAB — AST: AST: 16 IU/L (ref 0–40)

## 2015-07-11 LAB — LIPID PANEL
CHOL/HDL RATIO: 2.1 ratio (ref 0.0–5.0)
Cholesterol, Total: 119 mg/dL (ref 100–199)
HDL: 56 mg/dL (ref >39)
LDL CALC: 26 mg/dL (ref 0–99)
TRIGLYCERIDES: 184 mg/dL — AB (ref 0–149)
VLDL Cholesterol Cal: 37 mg/dL (ref 5–40)

## 2015-07-11 LAB — ALT: ALT: 8 IU/L (ref 0–44)

## 2015-07-14 ENCOUNTER — Encounter: Payer: Self-pay | Admitting: Family Medicine

## 2015-08-29 ENCOUNTER — Ambulatory Visit (INDEPENDENT_AMBULATORY_CARE_PROVIDER_SITE_OTHER): Payer: 59 | Admitting: Family Medicine

## 2015-08-29 ENCOUNTER — Encounter: Payer: Self-pay | Admitting: Family Medicine

## 2015-08-29 VITALS — BP 115/72 | HR 52 | Temp 97.0°F | Ht 67.6 in | Wt 160.0 lb

## 2015-08-29 DIAGNOSIS — R1010 Upper abdominal pain, unspecified: Secondary | ICD-10-CM

## 2015-08-29 LAB — UA/M W/RFLX CULTURE, ROUTINE
BILIRUBIN UA: NEGATIVE
GLUCOSE, UA: NEGATIVE
KETONES UA: NEGATIVE
Leukocytes, UA: NEGATIVE
NITRITE UA: NEGATIVE
Protein, UA: NEGATIVE
RBC UA: NEGATIVE
Specific Gravity, UA: <1.005 — ABNORMAL LOW (ref 1.005–1.030)
UUROB: 0.2 mg/dL (ref 0.2–1.0)
pH, UA: 5.5 (ref 5.0–7.5)

## 2015-08-29 LAB — CBC WITH DIFFERENTIAL/PLATELET
Hematocrit: 44.5 % (ref 37.5–51.0)
Hemoglobin: 15.4 g/dL (ref 12.6–17.7)
LYMPHS ABS: 1.2 10*3/uL (ref 0.7–3.1)
LYMPHS: 28 %
MCH: 31.4 pg (ref 26.6–33.0)
MCHC: 34.6 g/dL (ref 31.5–35.7)
MCV: 91 fL (ref 79–97)
MID (ABSOLUTE): 0.3 10*3/uL (ref 0.1–1.6)
MID: 8 %
Neutrophils Absolute: 2.7 10*3/uL (ref 1.4–7.0)
Neutrophils: 64 %
PLATELETS: 157 10*3/uL (ref 150–379)
RBC: 4.91 x10E6/uL (ref 4.14–5.80)
RDW: 13.3 % (ref 12.3–15.4)
WBC: 4.2 10*3/uL (ref 3.4–10.8)

## 2015-08-29 MED ORDER — CYCLOBENZAPRINE HCL 10 MG PO TABS: 10.0000 mg | ORAL_TABLET | Freq: Three times a day (TID) | ORAL | Status: DC | PRN

## 2015-08-29 NOTE — Progress Notes (Signed)
BP 115/72 mmHg  Pulse 52  Temp(Src) 97 F (36.1 C)  Ht 5' 7.6" (1.717 m)  Wt 160 lb (72.576 kg)  BMI 24.62 kg/m2  SpO2 95%   Subjective:    Patient ID: Roy English, male    DOB: 1951/02/25, 65 y.o.   MRN: [id number]  HPI: Roy English is a 65 y.o. male  Chief Complaint  Patient presents with  . Abdominal Pain    patient states that he is having pain across his abdomen that goes to his back, he denies any fever or vomiting. He has stopped his cholesterol medication for right now, he states that it feek better today that it has the last several days.   ABDOMINAL PAIN- has been having a lot of "muscle soreness" along his belly and into his back. Has lost a lot of weight and is still working on diet. Has been drinking 1.5 pots of coffee a day. Better today.  Duration: 1-2 months Onset: gradual Severity: mild Quality: aching and sore Location:  LUQ and RUQ  Episode duration: constant Radiation: yes into his back Frequency: constant Alleviating factors: backing off the coffee Aggravating factors: ? Cholesterol medicine Status: better Treatments attempted: beano, tums Fever: no, some flushes at night (subjective) Nausea: no Vomiting: no Weight loss: yes- with effort Decreased appetite: no Diarrhea: yes for the past 6 months to a year, not for the past 2 weeks- more constipated now Constipation: yes Blood in stool: no Heartburn: no Jaundice: no Rash: no Dysuria/urinary frequency: no Hematuria: no, but urine is looking more yellow than usual  Recurrent NSAID use: no  Relevant past medical, surgical, family and social history reviewed and updated as indicated. Interim medical history since our last visit reviewed. Allergies and medications reviewed and updated.  Review of Systems  Constitutional: Negative.   Respiratory: Negative.   Cardiovascular: Negative.   Gastrointestinal: Positive for diarrhea and constipation. Negative for nausea, vomiting, abdominal pain, blood in  stool, abdominal distention, anal bleeding and rectal pain.  Genitourinary: Positive for flank pain. Negative for dysuria, urgency, frequency, hematuria, decreased urine volume, discharge, penile swelling, scrotal swelling, enuresis, difficulty urinating, genital sores, penile pain and testicular pain.  Musculoskeletal: Positive for myalgias and back pain. Negative for joint swelling, arthralgias, gait problem, neck pain and neck stiffness.  Psychiatric/Behavioral: Negative.     Per HPI unless specifically indicated above     Objective:    BP 115/72 mmHg  Pulse 52  Temp(Src) 97 F (36.1 C)  Ht 5' 7.6" (1.717 m)  Wt 160 lb (72.576 kg)  BMI 24.62 kg/m2  SpO2 95%  Wt Readings from Last 3 Encounters:  08/29/15 160 lb (72.576 kg)  07/10/15 169 lb (76.658 kg)  07/15/14 204 lb (92.534 kg)    Physical Exam  Constitutional: He is oriented to person, place, and time. He appears well-developed and well-nourished. No distress.  HENT:  Head: Normocephalic and atraumatic.  Right Ear: Hearing normal.  Left Ear: Hearing normal.  Nose: Nose normal.  Eyes: Conjunctivae and lids are normal. Right eye exhibits no discharge. Left eye exhibits no discharge. No scleral icterus.  Cardiovascular: Normal rate, regular rhythm, normal heart sounds and intact distal pulses.  Exam reveals no gallop and no friction rub.   No murmur heard. Pulmonary/Chest: Effort normal and breath sounds normal. No respiratory distress. He has no wheezes. He has no rales. He exhibits no tenderness.  Abdominal: Soft. Bowel sounds are normal. He exhibits no distension and no mass. There is  no hepatosplenomegaly. There is tenderness. There is no rigidity, no rebound, no guarding, no CVA tenderness, no tenderness at McBurney's point and negative Murphy's sign.  Diffusely mildly tender "Sore" spasm of diaphragm bilaterally  Musculoskeletal: Normal range of motion.  Neurological: He is alert and oriented to person, place, and time.   Skin: Skin is warm, dry and intact. No rash noted. No erythema. No pallor.  Psychiatric: He has a normal mood and affect. His speech is normal and behavior is normal. Judgment and thought content normal. Cognition and memory are normal.  Nursing note and vitals reviewed.   Results for orders placed or performed in visit on [id number]  Basic metabolic panel  Result Value Ref Range   Glucose 101 (H) 65 - 99 mg/dL   BUN 18 8 - 27 mg/dL   Creatinine, Ser 0.97 0.76 - 1.27 mg/dL   GFR calc non Af Amer 83 >59 mL/min/1.73   GFR calc Af Amer 96 >59 mL/min/1.73   BUN/Creatinine Ratio 19 10 - 22   Sodium 142 136 - 144 mmol/L   Potassium 4.0 3.5 - 5.2 mmol/L   Chloride 99 97 - 106 mmol/L   CO2 28 18 - 29 mmol/L   Calcium 9.1 8.6 - 10.2 mg/dL  Lipid panel  Result Value Ref Range   Cholesterol, Total 119 100 - 199 mg/dL   Triglycerides 184 (H) 0 - 149 mg/dL   HDL 56 >39 mg/dL   VLDL Cholesterol Cal 37 5 - 40 mg/dL   LDL Calculated 26 0 - 99 mg/dL   Chol/HDL Ratio 2.1 0.0 - 5.0 ratio units  ALT  Result Value Ref Range   ALT 8 0 - 44 IU/L  AST  Result Value Ref Range   AST 16 0 - 40 IU/L      Assessment & Plan:   Problem List Items Addressed This Visit    None    Visit Diagnoses    Pain of upper abdomen    -  Primary    Seems to be due to diaphragm spasm. Will start cyclobenzaprine. Will check labs to look for other cause. Will check in on Monday and see how he's feeling.     Relevant Orders    CBC With Differential/Platelet    Comprehensive metabolic panel    UA/M w/rflx Culture, Routine    Amylase    Lipase        Follow up plan: Return if symptoms worsen or fail to improve.

## 2015-08-30 LAB — COMPREHENSIVE METABOLIC PANEL
A/G RATIO: 2 (ref 1.1–2.5)
ALBUMIN: 4.5 g/dL (ref 3.6–4.8)
ALT: 279 IU/L — ABNORMAL HIGH (ref 0–44)
AST: 184 IU/L — AB (ref 0–40)
Alkaline Phosphatase: 908 IU/L — ABNORMAL HIGH (ref 39–117)
BUN/Creatinine Ratio: 16 (ref 10–22)
BUN: 12 mg/dL (ref 8–27)
Bilirubin Total: 0.8 mg/dL (ref 0.0–1.2)
CALCIUM: 9.3 mg/dL (ref 8.6–10.2)
CO2: 27 mmol/L (ref 18–29)
Chloride: 99 mmol/L (ref 96–106)
Creatinine, Ser: 0.74 mg/dL — ABNORMAL LOW (ref 0.76–1.27)
GFR, EST AFRICAN AMERICAN: 113 mL/min/{1.73_m2} (ref >59)
GFR, EST NON AFRICAN AMERICAN: 98 mL/min/{1.73_m2} (ref >59)
GLOBULIN, TOTAL: 2.2 g/dL (ref 1.5–4.5)
Glucose: 108 mg/dL — ABNORMAL HIGH (ref 65–99)
POTASSIUM: 4.3 mmol/L (ref 3.5–5.2)
SODIUM: 141 mmol/L (ref 134–144)
TOTAL PROTEIN: 6.7 g/dL (ref 6.0–8.5)

## 2015-08-30 LAB — AMYLASE: Amylase: 69 U/L (ref 31–124)

## 2015-08-30 LAB — LIPASE: Lipase: 165 U/L — ABNORMAL HIGH (ref 0–59)

## 2015-09-01 ENCOUNTER — Telehealth: Payer: Self-pay | Admitting: Family Medicine

## 2015-09-01 DIAGNOSIS — R748 Abnormal levels of other serum enzymes: Secondary | ICD-10-CM

## 2015-09-01 DIAGNOSIS — R1010 Upper abdominal pain, unspecified: Secondary | ICD-10-CM

## 2015-09-01 NOTE — Telephone Encounter (Signed)
Called Roy English to discuss lab results. Liver enzymes really elevated. He is feeling a bit better with the muscle relaxer. He is normally a pretty heavy drinker drinking 8-10 beers a night, but has recently cut down to 3-4/night noting that he is drinking less. He has not been taking a lot of tylenol. Advised him to avoid alcohol and tylenol. Will check [place] of liver and RUQ ASAP- please order. Will hold on atorvastatin for the time being. Adding on acute hepatitis and GGT to his labs. Await results and continue to monitor closely.

## 2015-09-03 ENCOUNTER — Ambulatory Visit
Admission: RE | Admit: 2015-09-03 | Discharge: 2015-09-03 | Disposition: A | Discharge status: Home / Self Care | Payer: 59 | Source: Ambulatory Visit | Attending: Family Medicine | Admitting: Family Medicine

## 2015-09-03 ENCOUNTER — Encounter: Payer: Self-pay | Admitting: *Deleted

## 2015-09-03 ENCOUNTER — Ambulatory Visit: Payer: Self-pay

## 2015-09-03 DIAGNOSIS — R932 Abnormal findings on diagnostic imaging of liver and biliary tract: Secondary | ICD-10-CM | POA: Insufficient documentation

## 2015-09-03 DIAGNOSIS — R1010 Upper abdominal pain, unspecified: Secondary | ICD-10-CM | POA: Insufficient documentation

## 2015-09-03 DIAGNOSIS — R748 Abnormal levels of other serum enzymes: Secondary | ICD-10-CM | POA: Insufficient documentation

## 2015-09-03 LAB — SPECIMEN STATUS REPORT

## 2015-09-03 LAB — HEPATITIS PANEL, ACUTE
HEP A IGM: NEGATIVE
HEP B S AG: NEGATIVE
Hep B C IgM: NEGATIVE
Hep C Virus Ab: <0.1 s/co ratio (ref 0.0–0.9)

## 2015-09-03 LAB — GAMMA GT: GGT: 1606 IU/L — AB (ref 0–65)

## 2015-09-03 NOTE — Telephone Encounter (Signed)
Hey Tiff- Can we see if we can get him an urgent referral over to GI for elevated liver enzymes (Alk Phos >900, GGT>1000) and a dilated common bile duct on [place] with MRCP recommended. Referral generated, but sooner rather than later would be good. Thanks!

## 2015-09-03 NOTE — Telephone Encounter (Signed)
Called and spoke to patient. He is seeing GI tomorrow. Await referral.

## 2015-09-04 ENCOUNTER — Other Ambulatory Visit (INDEPENDENT_AMBULATORY_CARE_PROVIDER_SITE_OTHER): Payer: 59

## 2015-09-04 ENCOUNTER — Ambulatory Visit (INDEPENDENT_AMBULATORY_CARE_PROVIDER_SITE_OTHER): Payer: 59 | Admitting: Internal Medicine

## 2015-09-04 ENCOUNTER — Other Ambulatory Visit: Payer: Self-pay | Admitting: Internal Medicine

## 2015-09-04 ENCOUNTER — Telehealth: Payer: Self-pay | Admitting: *Deleted

## 2015-09-04 ENCOUNTER — Encounter: Payer: Self-pay | Admitting: Internal Medicine

## 2015-09-04 VITALS — BP 104/68 | HR 60 | Ht 68.11 in | Wt 162.2 lb

## 2015-09-04 DIAGNOSIS — R634 Abnormal weight loss: Secondary | ICD-10-CM | POA: Diagnosis not present

## 2015-09-04 DIAGNOSIS — K838 Other specified diseases of biliary tract: Secondary | ICD-10-CM | POA: Diagnosis not present

## 2015-09-04 DIAGNOSIS — R945 Abnormal results of liver function studies: Principal | ICD-10-CM

## 2015-09-04 DIAGNOSIS — R9389 Abnormal findings on diagnostic imaging of other specified body structures: Secondary | ICD-10-CM

## 2015-09-04 DIAGNOSIS — R7989 Other specified abnormal findings of blood chemistry: Secondary | ICD-10-CM

## 2015-09-04 DIAGNOSIS — R935 Abnormal findings on diagnostic imaging of other abdominal regions, including retroperitoneum: Secondary | ICD-10-CM

## 2015-09-04 DIAGNOSIS — R1011 Right upper quadrant pain: Secondary | ICD-10-CM

## 2015-09-04 LAB — COMPREHENSIVE METABOLIC PANEL
ALT: 342 U/L — AB (ref 0–53)
AST: 235 U/L — AB (ref 0–37)
Albumin: 4.2 g/dL (ref 3.5–5.2)
Alkaline Phosphatase: 1059 U/L — ABNORMAL HIGH (ref 39–117)
BILIRUBIN TOTAL: 3.1 mg/dL — AB (ref 0.2–1.2)
BUN: 13 mg/dL (ref 6–23)
CO2: 29 meq/L (ref 19–32)
CREATININE: 0.7 mg/dL (ref 0.40–1.50)
Calcium: 9.1 mg/dL (ref 8.4–10.5)
Chloride: 101 mEq/L (ref 96–112)
GFR: 120.86 mL/min (ref >60.00)
GLUCOSE: 100 mg/dL — AB (ref 70–99)
Potassium: 3.8 mEq/L (ref 3.5–5.1)
SODIUM: 137 meq/L (ref 135–145)
TOTAL PROTEIN: 6.9 g/dL (ref 6.0–8.3)

## 2015-09-04 LAB — PROTIME-INR
INR: 1.2 ratio — AB (ref 0.8–1.0)
PROTHROMBIN TIME: 12.1 s (ref 9.6–13.1)

## 2015-09-04 LAB — CBC WITH DIFFERENTIAL/PLATELET
BASOS ABS: 0 10*3/uL (ref 0.0–0.1)
BASOS PCT: 0.3 % (ref 0.0–3.0)
EOS PCT: 3.3 % (ref 0.0–5.0)
Eosinophils Absolute: 0.1 10*3/uL (ref 0.0–0.7)
HCT: 44 % (ref 39.0–52.0)
Hemoglobin: 14.7 g/dL (ref 13.0–17.0)
LYMPHS ABS: 1 10*3/uL (ref 0.7–4.0)
Lymphocytes Relative: 26.6 % (ref 12.0–46.0)
MCHC: 33.4 g/dL (ref 30.0–36.0)
MCV: 90.8 fl (ref 78.0–100.0)
MONOS PCT: 9.6 % (ref 3.0–12.0)
Monocytes Absolute: 0.4 10*3/uL (ref 0.1–1.0)
NEUTROS ABS: 2.3 10*3/uL (ref 1.4–7.7)
Neutrophils Relative %: 60.2 % (ref 43.0–77.0)
Platelets: 165 10*3/uL (ref 150.0–400.0)
RBC: 4.85 Mil/uL (ref 4.22–5.81)
RDW: 13.7 % (ref 11.5–15.5)
WBC: 3.7 10*3/uL — ABNORMAL LOW (ref 4.0–10.5)

## 2015-09-04 LAB — LIPASE: Lipase: 131 U/L — ABNORMAL HIGH (ref 11.0–59.0)

## 2015-09-04 LAB — AMYLASE: Amylase: 42 U/L (ref 27–131)

## 2015-09-04 NOTE — Patient Instructions (Addendum)
Your physician has requested that you go to the basement for the following lab work before leaving today: CMP, INR, CBC, lipase, amylase  You have been scheduled for an MRCP at [facility] on 09/06/15. Your appointment time is 6:30 pm. Please arrive 15 minutes prior to your appointment time for registration purposes. Please make certain not to have anything to eat or drink 6 hours prior to your test. In addition, if you have any metal in your body, have a pacemaker or defibrillator, please be sure to let your ordering physician know. This test typically takes 45 minutes to 1 hour to complete.

## 2015-09-04 NOTE — Progress Notes (Signed)
Patient ID: Roy English, male   DOB: 06/24/1951, 65 y.o.   MRN: [id number] HPI: Roy English is a 65 year old male with past medical history of interstitial lung disease, hypercholesterolemia and arthritis who seen in consult at the request of Dr. Ileana Ladd to evaluate abnormal liver enzymes, abnormal ultrasound of the abdomen and ongoing issues with upper abdominal pain. He reports that he has had issues with upper abdominal "soreness" located in the epigastrium and right upper quadrant worse over the last 3-4 weeks. Thinking back he's actually had discomfort which he said was very minimal for several months to possibly even a year. The pain seems to be there the majority of the time and doesn't necessarily change with eating. He's had about a week of mild nausea but no vomiting. He reports his appetite has remained very good. Over the last year he's lost approximately 45 pounds which he states was intentional. He placed himself on the Atkins diet and he still eats a low carbohydrate diet. Her primary care his weight was 204 pounds in December 2015, 169 pounds in December 2016 and 160 pounds on 08/29/2015. He states in late December he had a change in bowel habit and he is now become slightly constipated with more hard stools. Occasionally his stools appear quite dark but he denies visible blood or definitive melena. He's noticed a change in his urine which is more deep yellow than previously. Prior to late December his stools are often loose and occurring several times per day. He denies a family history of GI tract malignancy or colon polyps or cancer. His mother has had issues with occult GI bleeding.  Bilateral see was noted to have a markedly elevated alkaline phosphatase and GGT. AST and ALT were also elevated in the upper 100-200 range after having been previously very normal in December 2016. Total bili was normal at 0.8 but alkaline phosphatase was elevated at 908. Renal function was normal.  Hemoglobin normal platelets normal white count normal.  Ultrasound was done yesterday which showed gallbladder prominence without stones or thickening. Negative Murphy sign. CBD was markedly dilated to 16 mm.  Of note he does report prior colonoscopy but does not recall polyps  Past Medical History  Diagnosis Date  . Environmental allergies   . Arthritis   . ED (erectile dysfunction)   . Actinic keratosis   . Idiopathic interstitial fibrosis of lung syndrome ([place])   . Dilated cbd, acquired   . Elevated LFTs     Past Surgical History  Procedure Laterality Date  . Multiple surgeries      ribs,arms,nose,fingers  . Ac separation surgery Bilateral   . Knee arthroscopy Right   . Carpal tunnel surgery Bilateral   . Skin cancer removal       face and chest    Outpatient Prescriptions Prior to Visit  Medication Sig Dispense Refill  . cyclobenzaprine (FLEXERIL) 10 MG tablet Take 1 tablet (10 mg total) by mouth 3 (three) times daily as needed for muscle spasms. 30 tablet 0  . Glucosamine-Chondroitin (GLUCOSAMINE CHONDR COMPLEX PO) Take by mouth 2 (two) times daily.    Marland Kitchen atorvastatin (LIPITOR) 40 MG tablet Take 1 tablet (40 mg total) by mouth daily. (Patient not taking: Reported on 08/29/2015) 30 tablet 6  . sildenafil (VIAGRA) 100 MG tablet Take 1 tablet (100 mg total) by mouth daily as needed for erectile dysfunction. 10 tablet 12   No facility-administered medications prior to visit.    Allergies  Allergen Reactions  .  Penicillins     Family History  Problem Relation Age of Onset  . Heart disease Mother   . Rheum arthritis Mother   . Cancer      both sides of the family, type unknown    Social History  Substance Use Topics  . Smoking status: Never Smoker   . Smokeless tobacco: Never Used  . Alcohol Use: 0.0 oz/week    0 Standard drinks or equivalent per week     Comment: 6 beer daily-heavy on the weekends    ROS: As per history of present illness, otherwise  negative  BP 104/68 mmHg  Pulse 60  Ht 5' 8.11" (1.73 m)  Wt 162 lb 4 oz (73.596 kg)  BMI 24.59 kg/m2 Constitutional: Well-developed and well-nourished. No distress. HEENT: Normocephalic and atraumatic. Oropharynx is clear and moist. No oropharyngeal exudate. Conjunctivae are normal.  No scleral icterus. Neck: Neck supple. Trachea midline. Cardiovascular: Normal rate, regular rhythm and intact distal pulses. No M/R/G Pulmonary/chest: Effort normal and breath sounds normal. No wheezing, rales or rhonchi. Abdominal: Soft, nontender, nondistended. Bowel sounds active throughout. There are no masses palpable. No hepatosplenomegaly. Extremities: no clubbing, cyanosis, or edema Lymphadenopathy: No cervical adenopathy noted. Neurological: Alert and oriented to person place and time. Skin: Skin is warm and dry. No rashes noted. Psychiatric: Normal mood and affect. Behavior is normal.  RELEVANT LABS AND IMAGING: CBC    Component Value Date/Time   WBC 4.2 08/29/2015 1110   RBC 4.91 08/29/2015 1110   HCT 44.5 08/29/2015 1110   PLT 157 08/29/2015 1110   MCV 91 08/29/2015 1110   MCH 31.4 08/29/2015 1110   MCHC 34.6 08/29/2015 1110   RDW 13.3 08/29/2015 1110   LYMPHSABS 1.2 08/29/2015 1110    CMP     Component Value Date/Time   NA 141 08/29/2015 1110   K 4.3 08/29/2015 1110   CL 99 08/29/2015 1110   CO2 27 08/29/2015 1110   GLUCOSE 108* 08/29/2015 1110   BUN 12 08/29/2015 1110   CREATININE 0.74* 08/29/2015 1110   CALCIUM 9.3 08/29/2015 1110   PROT 6.7 08/29/2015 1110   ALBUMIN 4.5 08/29/2015 1110   AST 184* 08/29/2015 1110   ALT 279* 08/29/2015 1110   ALKPHOS 908* 08/29/2015 1110   BILITOT 0.8 08/29/2015 1110   GFRNONAA 98 08/29/2015 1110   GFRAA 113 08/29/2015 1110   Lipase     Component Value Date/Time   LIPASE 165* 08/29/2015 1110    Amylase    Component Value Date/Time   AMYLASE 69 08/29/2015 1110   GGT 1606  Acute viral hep panel -- neg   US ABDOMEN  LIMITED - RIGHT UPPER QUADRANT   COMPARISON:  CT 07/28/2012.   FINDINGS: Gallbladder:   No gallstones or wall thickening visualized. No sonographic Murphy sign noted by sonographer.   Common bile duct:   Diameter: 16 mm   Liver:   There is echogenic consistent fatty infiltration and/or hepatocellular disease. No focal hepatic abnormality identified.   IMPRESSION: 1. Gallbladder is prominent in size. No gallstones. No gallbladder wall thickening. Negative Murphy sign.   2. Common bile duct is dilated to 16 mm. MRCP should be considered for further evaluation to evaluate for obstructing lesion .   3. Liver is slightly echogenic consistent fatty infiltration and/or hepatocellular disease.     Electronically Signed   By: Marcello Moores  Register   On: 09/03/2015 09:29    ASSESSMENT/PLAN: 65 year old male with past medical history of interstitial lung disease, hypercholesterolemia and  arthritis who seen in consult at the request of Dr. Jeananne Rama to evaluate abnormal liver enzymes, abnormal ultrasound of the abdomen and ongoing issues with upper abdominal pain.  1. Elevated liver enzymes/dilated bile duct/weight loss/epigastric and right upper quadrant pain -- I'm concerned for biliary obstruction, etiology yet to be determined. Given weight loss malignancy needs to be excluded. We have discussed biliary anatomy and biliary obstruction today and I have recommended MRCP to be done as soon as possible. We tried to get it performed today but he has already had something to drink and so radiology declined. He will be scheduled for this Saturday. Depending on this result he may in fact need ERCP. We discussed ERCP today. Repeat labs to include CBC, CMP, INR, repeat amylase and lipase.     [id number] P Johnson, Do [address] [place], [place] [address]

## 2015-09-04 NOTE — Telephone Encounter (Signed)
Patient is scheduled for ERCP on 09/08/15 @ 1:15 pm [facility] (spoke with Linna Hoff). I have given patient information on time, date, location and prep instructions for procedure. Patient verbalizes understanding. He is also advised of worsening liver functions tests and to keep MRCP.

## 2015-09-04 NOTE — Telephone Encounter (Signed)
-----   Message from Roy Bears, MD sent at 09/04/2015 12:11 PM EST ----- Liver enzymes are more elevated today including total bilirubin MRCP scheduled for Saturday I discussed the case with Dr. Carlean Purl and we both feel ERCP will be necessary for biliary obstruction guided by information from MRCP I discussed ERCP with Dr. Carlean Purl and the [facility] long endoscopy team. They have a slot for ERCP on hold for Monday, 09/08/2015, to follow his other outpatient endoscopy. This will need to be with general anesthesia Patient will need to be notified and consented

## 2015-09-05 ENCOUNTER — Encounter (HOSPITAL_COMMUNITY): Payer: Self-pay | Admitting: *Deleted

## 2015-09-06 ENCOUNTER — Other Ambulatory Visit: Payer: Self-pay | Admitting: Internal Medicine

## 2015-09-06 ENCOUNTER — Ambulatory Visit (HOSPITAL_BASED_OUTPATIENT_CLINIC_OR_DEPARTMENT_OTHER)
Admission: RE | Admit: 2015-09-06 | Discharge: 2015-09-06 | Disposition: A | Discharge status: Home / Self Care | Payer: 59 | Source: Ambulatory Visit | Attending: Internal Medicine | Admitting: Internal Medicine

## 2015-09-06 DIAGNOSIS — Z139 Encounter for screening, unspecified: Secondary | ICD-10-CM | POA: Diagnosis not present

## 2015-09-06 DIAGNOSIS — K869 Disease of pancreas, unspecified: Secondary | ICD-10-CM | POA: Diagnosis not present

## 2015-09-06 DIAGNOSIS — R945 Abnormal results of liver function studies: Secondary | ICD-10-CM

## 2015-09-06 DIAGNOSIS — K838 Other specified diseases of biliary tract: Secondary | ICD-10-CM | POA: Diagnosis not present

## 2015-09-06 DIAGNOSIS — R109 Unspecified abdominal pain: Secondary | ICD-10-CM | POA: Diagnosis not present

## 2015-09-06 DIAGNOSIS — G8929 Other chronic pain: Secondary | ICD-10-CM | POA: Diagnosis not present

## 2015-09-06 DIAGNOSIS — R634 Abnormal weight loss: Secondary | ICD-10-CM | POA: Insufficient documentation

## 2015-09-06 DIAGNOSIS — R7989 Other specified abnormal findings of blood chemistry: Secondary | ICD-10-CM | POA: Diagnosis present

## 2015-09-06 DIAGNOSIS — T1590XA Foreign body on external eye, part unspecified, unspecified eye, initial encounter: Secondary | ICD-10-CM

## 2015-09-06 MED ORDER — GADOBENATE DIMEGLUMINE 529 MG/ML IV SOLN: 15.0000 mL | Freq: Once | INTRAVENOUS | Status: DC | PRN

## 2015-09-08 ENCOUNTER — Ambulatory Visit (HOSPITAL_COMMUNITY): Payer: 59 | Admitting: Anesthesiology

## 2015-09-08 ENCOUNTER — Encounter (HOSPITAL_COMMUNITY): Payer: Self-pay

## 2015-09-08 ENCOUNTER — Ambulatory Visit (HOSPITAL_COMMUNITY)
Admission: RE | Admit: 2015-09-08 | Discharge: 2015-09-08 | Disposition: A | Discharge status: Home / Self Care | Payer: 59 | Source: Ambulatory Visit | Attending: Internal Medicine | Admitting: Internal Medicine

## 2015-09-08 ENCOUNTER — Telehealth: Payer: Self-pay | Admitting: Internal Medicine

## 2015-09-08 ENCOUNTER — Encounter (HOSPITAL_COMMUNITY)
Admission: RE | Disposition: A | Discharge status: Home / Self Care | Payer: Self-pay | Source: Ambulatory Visit | Attending: Internal Medicine

## 2015-09-08 ENCOUNTER — Encounter: Payer: Self-pay | Admitting: Internal Medicine

## 2015-09-08 ENCOUNTER — Ambulatory Visit (HOSPITAL_COMMUNITY): Payer: 59

## 2015-09-08 DIAGNOSIS — K831 Obstruction of bile duct: Secondary | ICD-10-CM | POA: Diagnosis not present

## 2015-09-08 DIAGNOSIS — Z79899 Other long term (current) drug therapy: Secondary | ICD-10-CM | POA: Diagnosis not present

## 2015-09-08 DIAGNOSIS — R748 Abnormal levels of other serum enzymes: Secondary | ICD-10-CM | POA: Diagnosis not present

## 2015-09-08 DIAGNOSIS — Z85828 Personal history of other malignant neoplasm of skin: Secondary | ICD-10-CM | POA: Insufficient documentation

## 2015-09-08 DIAGNOSIS — E7800 Pure hypercholesterolemia, unspecified: Secondary | ICD-10-CM | POA: Insufficient documentation

## 2015-09-08 DIAGNOSIS — M1990 Unspecified osteoarthritis, unspecified site: Secondary | ICD-10-CM | POA: Insufficient documentation

## 2015-09-08 DIAGNOSIS — J849 Interstitial pulmonary disease, unspecified: Secondary | ICD-10-CM | POA: Diagnosis not present

## 2015-09-08 DIAGNOSIS — R945 Abnormal results of liver function studies: Secondary | ICD-10-CM

## 2015-09-08 DIAGNOSIS — R7989 Other specified abnormal findings of blood chemistry: Secondary | ICD-10-CM

## 2015-09-08 DIAGNOSIS — K869 Disease of pancreas, unspecified: Secondary | ICD-10-CM | POA: Insufficient documentation

## 2015-09-08 DIAGNOSIS — K8689 Other specified diseases of pancreas: Secondary | ICD-10-CM | POA: Insufficient documentation

## 2015-09-08 DIAGNOSIS — R9389 Abnormal findings on diagnostic imaging of other specified body structures: Secondary | ICD-10-CM

## 2015-09-08 DIAGNOSIS — K838 Other specified diseases of biliary tract: Secondary | ICD-10-CM | POA: Diagnosis present

## 2015-09-08 DIAGNOSIS — R1013 Epigastric pain: Secondary | ICD-10-CM

## 2015-09-08 HISTORY — PX: ERCP: SHX5425

## 2015-09-08 SURGERY — ERCP, WITH INTERVENTION IF INDICATED
Anesthesia: General

## 2015-09-08 MED ORDER — SUCCINYLCHOLINE CHLORIDE 20 MG/ML IJ SOLN
INTRAMUSCULAR | Status: DC | PRN
  Administered 2015-09-08: 100 mg via INTRAVENOUS

## 2015-09-08 MED ORDER — SODIUM CHLORIDE 0.9 % IV SOLN: INTRAVENOUS | Status: DC

## 2015-09-08 MED ORDER — INDOMETHACIN 50 MG RE SUPP
RECTAL | Status: AC
  Filled 2015-09-08: qty 2

## 2015-09-08 MED ORDER — FENTANYL CITRATE (PF) 100 MCG/2ML IJ SOLN
INTRAMUSCULAR | Status: DC | PRN
  Administered 2015-09-08: 100 ug via INTRAVENOUS

## 2015-09-08 MED ORDER — FENTANYL CITRATE (PF) 100 MCG/2ML IJ SOLN
INTRAMUSCULAR | Status: AC
  Filled 2015-09-08: qty 2

## 2015-09-08 MED ORDER — LACTATED RINGERS IV SOLN
INTRAVENOUS | Status: DC
  Administered 2015-09-08: 15:00:00 via INTRAVENOUS
  Administered 2015-09-08: 1000 mL via INTRAVENOUS

## 2015-09-08 MED ORDER — SODIUM CHLORIDE 0.9 % IV SOLN
INTRAVENOUS | Status: DC | PRN
  Administered 2015-09-08: 30 mL

## 2015-09-08 MED ORDER — PROPOFOL 10 MG/ML IV BOLUS
INTRAVENOUS | Status: DC | PRN
  Administered 2015-09-08: 150 mg via INTRAVENOUS

## 2015-09-08 MED ORDER — GLUCAGON HCL RDNA (DIAGNOSTIC) 1 MG IJ SOLR
INTRAMUSCULAR | Status: AC
  Filled 2015-09-08: qty 1

## 2015-09-08 MED ORDER — PROPOFOL 10 MG/ML IV BOLUS
INTRAVENOUS | Status: AC
  Filled 2015-09-08: qty 20

## 2015-09-08 MED ORDER — INDOMETHACIN 50 MG RE SUPP
100.0000 mg | Freq: Once | RECTAL | Status: AC
  Administered 2015-09-08: 100 mg via RECTAL

## 2015-09-08 MED ORDER — PHENYLEPHRINE HCL 10 MG/ML IJ SOLN
INTRAMUSCULAR | Status: DC | PRN
  Administered 2015-09-08 (×4): 80 ug via INTRAVENOUS

## 2015-09-08 MED ORDER — TRAMADOL HCL 50 MG PO TABS: ORAL_TABLET | ORAL | Status: DC

## 2015-09-08 MED ORDER — LIDOCAINE HCL (CARDIAC) 20 MG/ML IV SOLN
INTRAVENOUS | Status: AC
  Filled 2015-09-08: qty 5

## 2015-09-08 MED ORDER — CIPROFLOXACIN IN D5W 400 MG/200ML IV SOLN
400.0000 mg | Freq: Once | INTRAVENOUS | Status: AC
  Administered 2015-09-08: 400 mg via INTRAVENOUS

## 2015-09-08 MED ORDER — CIPROFLOXACIN IN D5W 400 MG/200ML IV SOLN
INTRAVENOUS | Status: AC
  Filled 2015-09-08: qty 200

## 2015-09-08 MED ORDER — EPHEDRINE SULFATE 50 MG/ML IJ SOLN
INTRAMUSCULAR | Status: DC | PRN
  Administered 2015-09-08: 5 mg via INTRAVENOUS

## 2015-09-08 MED ORDER — LIDOCAINE HCL (CARDIAC) 20 MG/ML IV SOLN
INTRAVENOUS | Status: DC | PRN
  Administered 2015-09-08: 100 mg via INTRAVENOUS

## 2015-09-08 NOTE — Discharge Instructions (Addendum)
° °  YOU HAD AN ENDOSCOPIC PROCEDURE TODAY: Refer to the procedure report and other information in the discharge instructions given to you for any specific questions about what was found during the examination. If this information does not answer your questions, please call Dr. Celesta Aver office at [phone] to clarify.   YOU SHOULD EXPECT: Some feelings of bloating in the abdomen. Passage of more gas than usual. Walking can help get rid of the air that was put into your GI tract during the procedure and reduce the bloating. If you had a lower endoscopy (such as a colonoscopy or flexible sigmoidoscopy) you may notice spotting of blood in your stool or on the toilet paper. Some abdominal soreness may be present for a day or two, also.  DIET:  Stay on liquids only today - start with clears and then soup tonight is ok. Solid food tomorrow if you feel ok. No alcohol x 24 hours.  ACTIVITY: Your care partner should take you home directly after the procedure. You should plan to take it easy, moving slowly for the rest of the day. You can resume normal activity the day after the procedure however YOU SHOULD NOT DRIVE, use power tools, machinery or perform tasks that involve climbing or major physical exertion until tomorrow after 0800.   SYMPTOMS TO REPORT IMMEDIATELY: A gastroenterologist can be reached at any hour. Please call [phone]  for any of the following symptoms:   Following upper endoscopy (EGD, EUS, ERCP, esophageal dilation) Vomiting of blood or coffee ground material  New, significant abdominal pain  New, significant chest pain or pain under the shoulder blades  Painful or persistently difficult swallowing  New shortness of breath  Black, tarry-looking or red, bloody stools  FOLLOW UP:  If any biopsies were taken you will be contacted by phone or by letter within the next 1-3 weeks. Call [phone]  if you have not heard about the biopsies in 3 weeks.  Please also call with  any specific questions about appointments or follow up tests.  I appreciate the opportunity to care for you. Gatha Mayer, MD, [facility]

## 2015-09-08 NOTE — Anesthesia Procedure Notes (Signed)
Procedure Name: Intubation Date/Time: 09/08/2015 1:51 PM Performed by: Danley Danker L Patient Re-evaluated:Patient Re-evaluated prior to inductionOxygen Delivery Method: Circle system utilized Preoxygenation: Pre-oxygenation with 100% oxygen Intubation Type: IV induction Ventilation: Mask ventilation without difficulty and Oral airway inserted - appropriate to patient size Laryngoscope Size: Miller and 3 Grade View: Grade II Tube type: Oral Tube size: 7.5 mm Number of attempts: 1 Airway Equipment and Method: Stylet Placement Confirmation: ETT inserted through vocal cords under direct vision,  breath sounds checked- equal and bilateral and positive ETCO2 Secured at: 21 cm Tube secured with: Tape Dental Injury: Teeth and Oropharynx as per pre-operative assessment

## 2015-09-08 NOTE — Telephone Encounter (Signed)
Having epigastric/RUQ pain radiating into the back - no nausea and vomiting, no fever  Had ERCP with sphinterotomy and stent today  He has passed gas and had a stool  Took Aleve x 2 and a Flexeril  Asking for analgesic  Does not want to return to ED  Will try to Rx tramadol

## 2015-09-08 NOTE — H&P (View-Only) (Signed)
Patient ID: Roy English, male   DOB: 10-05-1950, 65 y.o.   MRN: [id number] HPI: Roy English is a 65 year old male with past medical history of interstitial lung disease, hypercholesterolemia and arthritis who seen in consult at the request of Dr. Ileana Ladd to evaluate abnormal liver enzymes, abnormal ultrasound of the abdomen and ongoing issues with upper abdominal pain. He reports that he has had issues with upper abdominal "soreness" located in the epigastrium and right upper quadrant worse over the last 3-4 weeks. Thinking back he's actually had discomfort which he said was very minimal for several months to possibly even a year. The pain seems to be there the majority of the time and doesn't necessarily change with eating. He's had about a week of mild nausea but no vomiting. He reports his appetite has remained very good. Over the last year he's lost approximately 45 pounds which he states was intentional. He placed himself on the Atkins diet and he still eats a low carbohydrate diet. Her primary care his weight was 204 pounds in December 2015, 169 pounds in December 2016 and 160 pounds on 08/29/2015. He states in late December he had a change in bowel habit and he is now become slightly constipated with more hard stools. Occasionally his stools appear quite dark but he denies visible blood or definitive melena. He's noticed a change in his urine which is more deep yellow than previously. Prior to late December his stools are often loose and occurring several times per day. He denies a family history of GI tract malignancy or colon polyps or cancer. His mother has had issues with occult GI bleeding.  Bilateral see was noted to have a markedly elevated alkaline phosphatase and GGT. AST and ALT were also elevated in the upper 100-200 range after having been previously very normal in December 2016. Total bili was normal at 0.8 but alkaline phosphatase was elevated at 908. Renal function was normal.  Hemoglobin normal platelets normal white count normal.  Ultrasound was done yesterday which showed gallbladder prominence without stones or thickening. Negative Murphy sign. CBD was markedly dilated to 16 mm.  Of note he does report prior colonoscopy but does not recall polyps  Past Medical History  Diagnosis Date  . Environmental allergies   . Arthritis   . ED (erectile dysfunction)   . Actinic keratosis   . Idiopathic interstitial fibrosis of lung syndrome ([place])   . Dilated cbd, acquired   . Elevated LFTs     Past Surgical History  Procedure Laterality Date  . Multiple surgeries      ribs,arms,nose,fingers  . Ac separation surgery Bilateral   . Knee arthroscopy Right   . Carpal tunnel surgery Bilateral   . Skin cancer removal       face and chest    Outpatient Prescriptions Prior to Visit  Medication Sig Dispense Refill  . cyclobenzaprine (FLEXERIL) 10 MG tablet Take 1 tablet (10 mg total) by mouth 3 (three) times daily as needed for muscle spasms. 30 tablet 0  . Glucosamine-Chondroitin (GLUCOSAMINE CHONDR COMPLEX PO) Take by mouth 2 (two) times daily.    Marland Kitchen atorvastatin (LIPITOR) 40 MG tablet Take 1 tablet (40 mg total) by mouth daily. (Patient not taking: Reported on 08/29/2015) 30 tablet 6  . sildenafil (VIAGRA) 100 MG tablet Take 1 tablet (100 mg total) by mouth daily as needed for erectile dysfunction. 10 tablet 12   No facility-administered medications prior to visit.    Allergies  Allergen Reactions  .  Penicillins     Family History  Problem Relation Age of Onset  . Heart disease Mother   . Rheum arthritis Mother   . Cancer      both sides of the family, type unknown    Social History  Substance Use Topics  . Smoking status: Never Smoker   . Smokeless tobacco: Never Used  . Alcohol Use: 0.0 oz/week    0 Standard drinks or equivalent per week     Comment: 6 beer daily-heavy on the weekends    ROS: As per history of present illness, otherwise  negative  BP 104/68 mmHg  Pulse 60  Ht 5' 8.11" (1.73 m)  Wt 162 lb 4 oz (73.596 kg)  BMI 24.59 kg/m2 Constitutional: Well-developed and well-nourished. No distress. HEENT: Normocephalic and atraumatic. Oropharynx is clear and moist. No oropharyngeal exudate. Conjunctivae are normal.  No scleral icterus. Neck: Neck supple. Trachea midline. Cardiovascular: Normal rate, regular rhythm and intact distal pulses. No M/R/G Pulmonary/chest: Effort normal and breath sounds normal. No wheezing, rales or rhonchi. Abdominal: Soft, nontender, nondistended. Bowel sounds active throughout. There are no masses palpable. No hepatosplenomegaly. Extremities: no clubbing, cyanosis, or edema Lymphadenopathy: No cervical adenopathy noted. Neurological: Alert and oriented to person place and time. Skin: Skin is warm and dry. No rashes noted. Psychiatric: Normal mood and affect. Behavior is normal.  RELEVANT LABS AND IMAGING: CBC    Component Value Date/Time   WBC 4.2 08/29/2015 1110   RBC 4.91 08/29/2015 1110   HCT 44.5 08/29/2015 1110   PLT 157 08/29/2015 1110   MCV 91 08/29/2015 1110   MCH 31.4 08/29/2015 1110   MCHC 34.6 08/29/2015 1110   RDW 13.3 08/29/2015 1110   LYMPHSABS 1.2 08/29/2015 1110    CMP     Component Value Date/Time   NA 141 08/29/2015 1110   K 4.3 08/29/2015 1110   CL 99 08/29/2015 1110   CO2 27 08/29/2015 1110   GLUCOSE 108* 08/29/2015 1110   BUN 12 08/29/2015 1110   CREATININE 0.74* 08/29/2015 1110   CALCIUM 9.3 08/29/2015 1110   PROT 6.7 08/29/2015 1110   ALBUMIN 4.5 08/29/2015 1110   AST 184* 08/29/2015 1110   ALT 279* 08/29/2015 1110   ALKPHOS 908* 08/29/2015 1110   BILITOT 0.8 08/29/2015 1110   GFRNONAA 98 08/29/2015 1110   GFRAA 113 08/29/2015 1110   Lipase     Component Value Date/Time   LIPASE 165* 08/29/2015 1110    Amylase    Component Value Date/Time   AMYLASE 69 08/29/2015 1110   GGT 1606  Acute viral hep panel -- neg   US ABDOMEN  LIMITED - RIGHT UPPER QUADRANT   COMPARISON:  CT 07/28/2012.   FINDINGS: Gallbladder:   No gallstones or wall thickening visualized. No sonographic Murphy sign noted by sonographer.   Common bile duct:   Diameter: 16 mm   Liver:   There is echogenic consistent fatty infiltration and/or hepatocellular disease. No focal hepatic abnormality identified.   IMPRESSION: 1. Gallbladder is prominent in size. No gallstones. No gallbladder wall thickening. Negative Murphy sign.   2. Common bile duct is dilated to 16 mm. MRCP should be considered for further evaluation to evaluate for obstructing lesion .   3. Liver is slightly echogenic consistent fatty infiltration and/or hepatocellular disease.     Electronically Signed   By: Marcello Moores  Register   On: 09/03/2015 09:29    ASSESSMENT/PLAN: 65 year old male with past medical history of interstitial lung disease, hypercholesterolemia and  arthritis who seen in consult at the request of Dr. Jeananne Rama to evaluate abnormal liver enzymes, abnormal ultrasound of the abdomen and ongoing issues with upper abdominal pain.  1. Elevated liver enzymes/dilated bile duct/weight loss/epigastric and right upper quadrant pain -- I'm concerned for biliary obstruction, etiology yet to be determined. Given weight loss malignancy needs to be excluded. We have discussed biliary anatomy and biliary obstruction today and I have recommended MRCP to be done as soon as possible. We tried to get it performed today but he has already had something to drink and so radiology declined. He will be scheduled for this Saturday. Depending on this result he may in fact need ERCP. We discussed ERCP today. Repeat labs to include CBC, CMP, INR, repeat amylase and lipase.     [id number] P Johnson, Do [id number] [place], [place] [address]

## 2015-09-08 NOTE — Anesthesia Preprocedure Evaluation (Signed)
Anesthesia Evaluation  Patient identified by MRN, date of birth, ID band Patient awake    Reviewed: Allergy & Precautions, NPO status , Patient's Chart, lab work & pertinent test results  History of Anesthesia Complications Negative for: history of anesthetic complications  Airway Mallampati: II  TM Distance: >3 FB Neck ROM: Full    Dental  (+) Teeth Intact   Pulmonary neg pulmonary ROS,    breath sounds clear to auscultation       Cardiovascular negative cardio ROS   Rhythm:Regular     Neuro/Psych negative neurological ROS  negative psych ROS   GI/Hepatic Neg liver ROS,   Endo/Other  negative endocrine ROS  Renal/GU negative Renal ROS     Musculoskeletal  (+) Arthritis ,   Abdominal   Peds  Hematology negative hematology ROS (+)   Anesthesia Other Findings   Reproductive/Obstetrics                             Anesthesia Physical Anesthesia Plan  ASA: II  Anesthesia Plan: General   Post-op Pain Management:    Induction: Intravenous  Airway Management Planned: Oral ETT  Additional Equipment: None  Intra-op Plan:   Post-operative Plan: Extubation in OR  Informed Consent: I have reviewed the patients History and Physical, chart, labs and discussed the procedure including the risks, benefits and alternatives for the proposed anesthesia with the patient or authorized representative who has indicated his/her understanding and acceptance.   Dental advisory given  Plan Discussed with: CRNA and Surgeon  Anesthesia Plan Comments:         Anesthesia Quick Evaluation

## 2015-09-08 NOTE — Transfer of Care (Signed)
Immediate Anesthesia Transfer of Care Note  Patient: Roy English  Procedure(s) Performed: Procedure(s): ENDOSCOPIC RETROGRADE CHOLANGIOPANCREATOGRAPHY (ERCP) (N/A)  Patient Location: PACU  Anesthesia Type:General  Level of Consciousness: awake, alert , oriented and patient cooperative  Airway & Oxygen Therapy: Patient Spontanous Breathing and Patient connected to face mask oxygen  Post-op Assessment: Report given to RN, Post -op Vital signs reviewed and stable and Patient moving all extremities X 4  Post vital signs: stable  Last Vitals:  Filed Vitals:   09/08/15 1258  BP: 117/81  Pulse: 60  Temp: 36.6 C  Resp: 21    Complications: No apparent anesthesia complications

## 2015-09-08 NOTE — Anesthesia Postprocedure Evaluation (Signed)
Anesthesia Post Note  Patient: CARLTON MACER  Procedure(s) Performed: Procedure(s) (LRB): ENDOSCOPIC RETROGRADE CHOLANGIOPANCREATOGRAPHY (ERCP) (N/A)  Patient location during evaluation: PACU Anesthesia Type: General Level of consciousness: awake and alert Pain management: pain level controlled Vital Signs Assessment: post-procedure vital signs reviewed and stable Respiratory status: spontaneous breathing, nonlabored ventilation, respiratory function stable and patient connected to nasal cannula oxygen Cardiovascular status: blood pressure returned to baseline and stable Postop Assessment: no signs of nausea or vomiting Anesthetic complications: no    Last Vitals:  Filed Vitals:   09/08/15 1520 09/08/15 1530  BP: 142/84 144/86  Pulse: 60 58  Temp:    Resp: 12 15    Last Pain: There were no vitals filed for this visit.               Carliss Quast L

## 2015-09-08 NOTE — Interval H&P Note (Signed)
History and Physical Interval Note:  09/08/2015 1:47 PM  Roy English  has presented today for surgery, with the diagnosis of elevated lft's, dilated cbd; abnormal ultrasound  The various methods of treatment have been discussed with the patient and family. After consideration of risks, benefits and other options for treatment, the patient has consented to  Procedure(s): ENDOSCOPIC RETROGRADE CHOLANGIOPANCREATOGRAPHY (ERCP) (N/A) as a surgical intervention .  The patient's history has been reviewed, patient examined, no change in status, stable for surgery.  I have reviewed the patient's chart and labs.  Questions were answered to the patient's satisfaction.     Silvano Rusk

## 2015-09-08 NOTE — Op Note (Signed)
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   pancreatic mass PROCEDURE PERFORMED:     ERCP with sphincterotomy/papillotomy ERCP with stent placement MEDICATIONS:     Per Anesthesia     Cipro 400 mg IV  CONSENT: The patient understands the risks and benefits of the procedure and understands that these risks include, but are not limited to: sedation, allergic reaction, infection, perforation and/or bleeding. Alternative means of evaluation and treatment include, among others: physical exam, x-rays, and/or surgical intervention. The patient elects to proceed with this endoscopic procedure.  DESCRIPTION OF PROCEDURE: During intra-op preparation period all mechanical & medical equipment was checked for proper function. Hand hygiene and appropriate measures for infection prevention was taken. After the risks, benefits and alternatives of the procedure were thoroughly explained, Informed was verified, confirmed and timeout was successfully executed by the treatment team. With the patient in left semi-prone position, medications were administered intravenously.The    was passed from the mouth into the esophagus and further advanced from the esophagus into the stomach. From stomach scope was directed to the second portion of the duodenum. Major papilla was aligned with the duodenoscope. The scope position was confirmed fluoroscopically. Rest of the findings/therapeutics are given below. The scope was then completely withdrawn from the patient and the procedure completed. The  pulse, BP, and O2 saturation were monitored and documented by the physician and the nursing staff throughout the entire procedure. The patient was cared for as planned according to standard protocol. The patient was then discharged to recovery in stable condition and with appropriate post procedure care. Estimated blood loss is zero unless otherwise noted in this procedure report.  1) Difficult scope insertion - initially unable to enter antrum with duodenoscope.  Passed a gastroscope - showed normal anatomy and mucosa esophagus, stomach and duodenum.  Using fluoro manipulated duodenoscope into antrun and duodenum. 2) Major papilla was edematous with some bile draining.  It was cannulated with a wire. 3) Contrast injection showed distal CBD stricture with upstream dilation - not filled out well.  gallbladder not filled.  Medium sphinctterotomy performed with wire in CBD.   4) Cytology brushings x 2 of the stricture performed. 5) 5 cm 10 Fr plastic stent plaed with excellent drainage of dark bile. 6) no pancreatogram by intent     ADVERSE EVENT:     There were no complications. IMPRESSIONS:     1)Distal CBD stricture with upstream dilation - not filled out well.  gallbladder not filled.  Medium sphinctterotomy performed with wire in CBD. 2) Cytology brushings x 2 of the stricture performed. 3) 5 cm 10 Fr plastic stent plaed with excellent drainage of dark bile 4) No pancreatogram by intent  RECOMMENDATIONS:     Observe- await cytology results - EUS seems likely  next step +/- FNA    ___________________________________ Gatha Mayer, MD, [facility] eSigned:  Gatha Mayer, MD, [facility] 09/08/2015 3:05 PM   cc: Dr. Park Liter        PATIENT NAME:  Roy English, Roy English MR#: [id number]

## 2015-09-09 NOTE — Telephone Encounter (Signed)
Patient reports reports that the pain has improved.  Took last pain meds 2 hours ago.  Was able to eat eggs and bacon this am without nausea or vomiting this am.  Denies fever.  States that the pain meds are "doing their job, and I do feel better"

## 2015-09-09 NOTE — Telephone Encounter (Signed)
We will call today and see if he is ok

## 2015-09-10 ENCOUNTER — Encounter (HOSPITAL_COMMUNITY): Payer: Self-pay | Admitting: Internal Medicine

## 2015-09-10 NOTE — Progress Notes (Signed)
Quick Note:  Non-diagnostic brushings Have discussed case with Dr. Hilarie Fredrickson and patient Plan is refer to [facility] for EUS and Surgical evaluation Dr. Hilarie Fredrickson will coordinate referrals ______

## 2015-09-15 ENCOUNTER — Telehealth: Payer: Self-pay | Admitting: Family Medicine

## 2015-09-15 DIAGNOSIS — C259 Malignant neoplasm of pancreas, unspecified: Secondary | ICD-10-CM | POA: Insufficient documentation

## 2015-09-15 DIAGNOSIS — C250 Malignant neoplasm of head of pancreas: Secondary | ICD-10-CM

## 2015-09-15 NOTE — Telephone Encounter (Signed)
Needs a CMET, CBC, lipase and amylase Dr. Hilarie Fredrickson was working on referral to [facility]

## 2015-09-15 NOTE — Telephone Encounter (Signed)
Patient notified of lab orders.   He is about an hour and a half drive away.  He will come first thing in the morning.

## 2015-09-15 NOTE — Telephone Encounter (Signed)
Patient reports that he is having pain that radiates into his back. Pain is very similar to the pain that brought him to the office and the pain he had after ERCP.   Pain is worse at night.  He is requiring pain medication at bedtime and the last few nights has had to take another one in the early am.  He denies fever, nausea, vomiting, chills or other complaints.  He also asked about future plans that were discussed at ERCP

## 2015-09-15 NOTE — Telephone Encounter (Signed)
Left message for patient to call back  

## 2015-09-15 NOTE — Telephone Encounter (Signed)
Spoke to Morton about the results, which he was aware of previously. Confirmed that he has been in contact with GI and with [facility]. He has had the ultrasound done and is in contact with [facility] to get in to have surgery on the mass at the head of the pancreas. He will contact us if there is anything at all that he needs.

## 2015-09-16 ENCOUNTER — Other Ambulatory Visit (INDEPENDENT_AMBULATORY_CARE_PROVIDER_SITE_OTHER): Payer: 59

## 2015-09-16 DIAGNOSIS — R1013 Epigastric pain: Secondary | ICD-10-CM | POA: Diagnosis not present

## 2015-09-16 LAB — CBC WITH DIFFERENTIAL/PLATELET
BASOS ABS: 0 10*3/uL (ref 0.0–0.1)
Basophils Relative: 0.4 % (ref 0.0–3.0)
Eosinophils Absolute: 0.1 10*3/uL (ref 0.0–0.7)
Eosinophils Relative: 2.7 % (ref 0.0–5.0)
HCT: 42.4 % (ref 39.0–52.0)
Hemoglobin: 14.5 g/dL (ref 13.0–17.0)
LYMPHS ABS: 1.1 10*3/uL (ref 0.7–4.0)
Lymphocytes Relative: 25.6 % (ref 12.0–46.0)
MCHC: 34.3 g/dL (ref 30.0–36.0)
MCV: 88.7 fl (ref 78.0–100.0)
MONOS PCT: 7.8 % (ref 3.0–12.0)
Monocytes Absolute: 0.3 10*3/uL (ref 0.1–1.0)
NEUTROS PCT: 63.5 % (ref 43.0–77.0)
Neutro Abs: 2.8 10*3/uL (ref 1.4–7.7)
Platelets: 187 10*3/uL (ref 150.0–400.0)
RBC: 4.78 Mil/uL (ref 4.22–5.81)
RDW: 12.7 % (ref 11.5–15.5)
WBC: 4.4 10*3/uL (ref 4.0–10.5)

## 2015-09-16 LAB — COMPREHENSIVE METABOLIC PANEL
ALBUMIN: 4.4 g/dL (ref 3.5–5.2)
ALK PHOS: 382 U/L — AB (ref 39–117)
ALT: 69 U/L — AB (ref 0–53)
AST: 27 U/L (ref 0–37)
BILIRUBIN TOTAL: 1.5 mg/dL — AB (ref 0.2–1.2)
BUN: 14 mg/dL (ref 6–23)
CALCIUM: 9.3 mg/dL (ref 8.4–10.5)
CO2: 32 mEq/L (ref 19–32)
Chloride: 101 mEq/L (ref 96–112)
Creatinine, Ser: 0.73 mg/dL (ref 0.40–1.50)
GFR: 115.13 mL/min (ref >60.00)
GLUCOSE: 112 mg/dL — AB (ref 70–99)
Potassium: 4.3 mEq/L (ref 3.5–5.1)
Sodium: 138 mEq/L (ref 135–145)
TOTAL PROTEIN: 6.9 g/dL (ref 6.0–8.3)

## 2015-09-16 LAB — AMYLASE: AMYLASE: 35 U/L (ref 27–131)

## 2015-09-16 LAB — LIPASE: Lipase: 71 U/L — ABNORMAL HIGH (ref 11.0–59.0)

## 2015-09-16 NOTE — Progress Notes (Signed)
Quick Note:  Labs show improvement and do not suggest pancreatitis The pain he is having could be from the tumor itself vs the stent - I favor   I can Rx narcotic meds if he needs something stronger than the tramadol     ______

## 2015-09-17 ENCOUNTER — Other Ambulatory Visit: Payer: Self-pay | Admitting: Internal Medicine

## 2015-09-17 NOTE — Telephone Encounter (Signed)
OK to refill Please ask him if he has received an appointment from [facility] yet

## 2015-09-17 NOTE — Telephone Encounter (Signed)
May I refill Sir, thank you. 

## 2015-09-18 NOTE — Telephone Encounter (Signed)
Spoke with Roy English and he said they went to [facility] yesterday and they took a biopsy.  He said when they get results and if anything needs to be done he wants you to do it unless you think it should be done at [facility].

## 2015-09-21 ENCOUNTER — Inpatient Hospital Stay (HOSPITAL_COMMUNITY): Payer: 59

## 2015-09-21 ENCOUNTER — Emergency Department (HOSPITAL_COMMUNITY): Payer: 59

## 2015-09-21 ENCOUNTER — Encounter (HOSPITAL_COMMUNITY): Payer: Self-pay | Admitting: Emergency Medicine

## 2015-09-21 ENCOUNTER — Inpatient Hospital Stay (HOSPITAL_COMMUNITY)
Admission: EM | Admit: 2015-09-21 | Discharge: 2015-09-24 | DRG: 445 | Disposition: A | Discharge status: Home / Self Care | Payer: 59 | Attending: Internal Medicine | Admitting: Internal Medicine

## 2015-09-21 DIAGNOSIS — Z79899 Other long term (current) drug therapy: Secondary | ICD-10-CM | POA: Diagnosis not present

## 2015-09-21 DIAGNOSIS — Y838 Other surgical procedures as the cause of abnormal reaction of the patient, or of later complication, without mention of misadventure at the time of the procedure: Secondary | ICD-10-CM | POA: Diagnosis present

## 2015-09-21 DIAGNOSIS — K869 Disease of pancreas, unspecified: Secondary | ICD-10-CM

## 2015-09-21 DIAGNOSIS — K831 Obstruction of bile duct: Principal | ICD-10-CM | POA: Diagnosis present

## 2015-09-21 DIAGNOSIS — M1990 Unspecified osteoarthritis, unspecified site: Secondary | ICD-10-CM | POA: Diagnosis present

## 2015-09-21 DIAGNOSIS — T85520A Displacement of bile duct prosthesis, initial encounter: Secondary | ICD-10-CM

## 2015-09-21 DIAGNOSIS — T85898A Other specified complication of other internal prosthetic devices, implants and grafts, initial encounter: Secondary | ICD-10-CM | POA: Diagnosis present

## 2015-09-21 DIAGNOSIS — R7989 Other specified abnormal findings of blood chemistry: Secondary | ICD-10-CM

## 2015-09-21 DIAGNOSIS — R1013 Epigastric pain: Secondary | ICD-10-CM

## 2015-09-21 DIAGNOSIS — K8689 Other specified diseases of pancreas: Secondary | ICD-10-CM | POA: Diagnosis present

## 2015-09-21 DIAGNOSIS — K830 Cholangitis: Secondary | ICD-10-CM | POA: Diagnosis present

## 2015-09-21 DIAGNOSIS — R339 Retention of urine, unspecified: Secondary | ICD-10-CM | POA: Diagnosis not present

## 2015-09-21 DIAGNOSIS — R100 Acute abdomen: Secondary | ICD-10-CM | POA: Diagnosis not present

## 2015-09-21 DIAGNOSIS — Z880 Allergy status to penicillin: Secondary | ICD-10-CM

## 2015-09-21 DIAGNOSIS — J849 Interstitial pulmonary disease, unspecified: Secondary | ICD-10-CM | POA: Diagnosis present

## 2015-09-21 DIAGNOSIS — R748 Abnormal levels of other serum enzymes: Secondary | ICD-10-CM | POA: Diagnosis present

## 2015-09-21 DIAGNOSIS — Z8261 Family history of arthritis: Secondary | ICD-10-CM

## 2015-09-21 DIAGNOSIS — E876 Hypokalemia: Secondary | ICD-10-CM | POA: Diagnosis present

## 2015-09-21 DIAGNOSIS — R109 Unspecified abdominal pain: Secondary | ICD-10-CM | POA: Diagnosis present

## 2015-09-21 DIAGNOSIS — A419 Sepsis, unspecified organism: Secondary | ICD-10-CM

## 2015-09-21 DIAGNOSIS — N2889 Other specified disorders of kidney and ureter: Secondary | ICD-10-CM

## 2015-09-21 DIAGNOSIS — R1011 Right upper quadrant pain: Secondary | ICD-10-CM | POA: Diagnosis present

## 2015-09-21 DIAGNOSIS — Z8249 Family history of ischemic heart disease and other diseases of the circulatory system: Secondary | ICD-10-CM

## 2015-09-21 DIAGNOSIS — Z85828 Personal history of other malignant neoplasm of skin: Secondary | ICD-10-CM | POA: Diagnosis not present

## 2015-09-21 DIAGNOSIS — R740 Nonspecific elevation of levels of transaminase and lactic acid dehydrogenase [LDH]: Secondary | ICD-10-CM | POA: Diagnosis present

## 2015-09-21 DIAGNOSIS — C259 Malignant neoplasm of pancreas, unspecified: Secondary | ICD-10-CM | POA: Diagnosis present

## 2015-09-21 DIAGNOSIS — R509 Fever, unspecified: Secondary | ICD-10-CM

## 2015-09-21 LAB — LACTIC ACID, PLASMA
Lactic Acid, Venous: 1.3 mmol/L (ref 0.5–2.0)
Lactic Acid, Venous: 0.8 mmol/L (ref 0.5–2.0)

## 2015-09-21 LAB — COMPREHENSIVE METABOLIC PANEL
ALBUMIN: 4.4 g/dL (ref 3.5–5.0)
ALBUMIN: 3.6 g/dL (ref 3.5–5.0)
ALT: 148 U/L — ABNORMAL HIGH (ref 17–63)
ALT: 110 U/L — ABNORMAL HIGH (ref 17–63)
ANION GAP: 11 (ref 5–15)
ANION GAP: 7 (ref 5–15)
AST: 159 U/L — ABNORMAL HIGH (ref 15–41)
AST: 100 U/L — AB (ref 15–41)
Alkaline Phosphatase: 553 U/L — ABNORMAL HIGH (ref 38–126)
Alkaline Phosphatase: 459 U/L — ABNORMAL HIGH (ref 38–126)
BILIRUBIN TOTAL: 1.8 mg/dL — AB (ref 0.3–1.2)
BUN: 14 mg/dL (ref 6–20)
BUN: 8 mg/dL (ref 6–20)
CO2: 22 mmol/L (ref 22–32)
CO2: 26 mmol/L (ref 22–32)
Calcium: 8.8 mg/dL — ABNORMAL LOW (ref 8.9–10.3)
Calcium: 8.2 mg/dL — ABNORMAL LOW (ref 8.9–10.3)
Chloride: 102 mmol/L (ref 101–111)
Chloride: 104 mmol/L (ref 101–111)
Creatinine, Ser: 0.73 mg/dL (ref 0.61–1.24)
Creatinine, Ser: 0.75 mg/dL (ref 0.61–1.24)
GFR calc Af Amer: >60 mL/min (ref >60)
GFR calc non Af Amer: >60 mL/min (ref >60)
GFR calc non Af Amer: >60 mL/min (ref >60)
GLUCOSE: 91 mg/dL (ref 65–99)
GLUCOSE: 132 mg/dL — AB (ref 65–99)
POTASSIUM: 3.2 mmol/L — AB (ref 3.5–5.1)
POTASSIUM: 3.7 mmol/L (ref 3.5–5.1)
SODIUM: 137 mmol/L (ref 135–145)
Sodium: 135 mmol/L (ref 135–145)
TOTAL PROTEIN: 7 g/dL (ref 6.5–8.1)
Total Bilirubin: 1.4 mg/dL — ABNORMAL HIGH (ref 0.3–1.2)
Total Protein: 5.9 g/dL — ABNORMAL LOW (ref 6.5–8.1)

## 2015-09-21 LAB — CBC WITH DIFFERENTIAL/PLATELET
BASOS ABS: 0 10*3/uL (ref 0.0–0.1)
BASOS PCT: 0 %
EOS ABS: 0 10*3/uL (ref 0.0–0.7)
Eosinophils Relative: 0 %
HEMATOCRIT: 36.5 % — AB (ref 39.0–52.0)
HEMOGLOBIN: 12.5 g/dL — AB (ref 13.0–17.0)
Lymphocytes Relative: 5 %
Lymphs Abs: 0.4 10*3/uL — ABNORMAL LOW (ref 0.7–4.0)
MCH: 30.3 pg (ref 26.0–34.0)
MCHC: 34.2 g/dL (ref 30.0–36.0)
MCV: 88.4 fL (ref 78.0–100.0)
MONOS PCT: 3 %
Monocytes Absolute: 0.2 10*3/uL (ref 0.1–1.0)
NEUTROS ABS: 6.3 10*3/uL (ref 1.7–7.7)
NEUTROS PCT: 92 %
Platelets: 114 10*3/uL — ABNORMAL LOW (ref 150–400)
RBC: 4.13 MIL/uL — ABNORMAL LOW (ref 4.22–5.81)
RDW: 12.5 % (ref 11.5–15.5)
WBC: 6.8 10*3/uL (ref 4.0–10.5)

## 2015-09-21 LAB — CBC
HCT: 40.5 % (ref 39.0–52.0)
Hemoglobin: 14.1 g/dL (ref 13.0–17.0)
MCH: 30.5 pg (ref 26.0–34.0)
MCHC: 34.8 g/dL (ref 30.0–36.0)
MCV: 87.7 fL (ref 78.0–100.0)
Platelets: 153 10*3/uL (ref 150–400)
RBC: 4.62 MIL/uL (ref 4.22–5.81)
RDW: 12.5 % (ref 11.5–15.5)
WBC: 10 10*3/uL (ref 4.0–10.5)

## 2015-09-21 LAB — URINALYSIS, ROUTINE W REFLEX MICROSCOPIC
BILIRUBIN URINE: NEGATIVE
Glucose, UA: NEGATIVE mg/dL
HGB URINE DIPSTICK: NEGATIVE
KETONES UR: 15 mg/dL — AB
Leukocytes, UA: NEGATIVE
NITRITE: NEGATIVE
Protein, ur: NEGATIVE mg/dL
Specific Gravity, Urine: 1.011 (ref 1.005–1.030)
pH: 5.5 (ref 5.0–8.0)

## 2015-09-21 LAB — I-STAT CG4 LACTIC ACID, ED
LACTIC ACID, VENOUS: 0.75 mmol/L (ref 0.5–2.0)
LACTIC ACID, VENOUS: 1.43 mmol/L (ref 0.5–2.0)

## 2015-09-21 LAB — PROTIME-INR
INR: 1.35 (ref 0.00–1.49)
PROTHROMBIN TIME: 16.8 s — AB (ref 11.6–15.2)

## 2015-09-21 LAB — APTT: aPTT: 40 seconds — ABNORMAL HIGH (ref 24–37)

## 2015-09-21 LAB — LIPASE, BLOOD: Lipase: 51 U/L (ref 11–51)

## 2015-09-21 LAB — PROCALCITONIN: Procalcitonin: 0.35 ng/mL

## 2015-09-21 MED ORDER — ENOXAPARIN SODIUM 40 MG/0.4ML ~~LOC~~ SOLN
40.0000 mg | SUBCUTANEOUS | Status: DC
  Administered 2015-09-21: 40 mg via SUBCUTANEOUS
  Filled 2015-09-21: qty 0.4

## 2015-09-21 MED ORDER — METRONIDAZOLE IN NACL 5-0.79 MG/ML-% IV SOLN
500.0000 mg | Freq: Three times a day (TID) | INTRAVENOUS | Status: DC
  Administered 2015-09-21 – 2015-09-24 (×9): 500 mg via INTRAVENOUS
  Filled 2015-09-21 (×9): qty 100

## 2015-09-21 MED ORDER — MORPHINE SULFATE (PF) 4 MG/ML IV SOLN
4.0000 mg | Freq: Once | INTRAVENOUS | Status: AC
  Administered 2015-09-21: 4 mg via INTRAVENOUS
  Filled 2015-09-21: qty 1

## 2015-09-21 MED ORDER — IOHEXOL 300 MG/ML  SOLN
25.0000 mL | Freq: Once | INTRAMUSCULAR | Status: AC | PRN
  Administered 2015-09-21: 25 mL via ORAL

## 2015-09-21 MED ORDER — ONDANSETRON HCL 4 MG PO TABS: 4.0000 mg | ORAL_TABLET | Freq: Four times a day (QID) | ORAL | Status: DC | PRN

## 2015-09-21 MED ORDER — ONDANSETRON HCL 4 MG/2ML IJ SOLN: 4.0000 mg | Freq: Four times a day (QID) | INTRAMUSCULAR | Status: DC | PRN

## 2015-09-21 MED ORDER — HYDROMORPHONE HCL 1 MG/ML IJ SOLN
1.0000 mg | INTRAMUSCULAR | Status: DC | PRN
  Administered 2015-09-21 – 2015-09-23 (×14): 1 mg via INTRAVENOUS
  Filled 2015-09-21 (×14): qty 1

## 2015-09-21 MED ORDER — ENSURE ENLIVE PO LIQD
237.0000 mL | Freq: Two times a day (BID) | ORAL | Status: DC
  Administered 2015-09-24: 237 mL via ORAL

## 2015-09-21 MED ORDER — SODIUM CHLORIDE 0.9 % IV BOLUS (SEPSIS)
500.0000 mL | INTRAVENOUS | Status: AC
  Administered 2015-09-21: 500 mL via INTRAVENOUS

## 2015-09-21 MED ORDER — IOHEXOL 300 MG/ML  SOLN
100.0000 mL | Freq: Once | INTRAMUSCULAR | Status: AC | PRN
  Administered 2015-09-21: 100 mL via INTRAVENOUS

## 2015-09-21 MED ORDER — SODIUM CHLORIDE 0.9 % IV BOLUS (SEPSIS)
1000.0000 mL | INTRAVENOUS | Status: AC
  Administered 2015-09-21: 1000 mL via INTRAVENOUS

## 2015-09-21 MED ORDER — SODIUM CHLORIDE 0.9 % IV BOLUS (SEPSIS)
1000.0000 mL | Freq: Once | INTRAVENOUS | Status: AC
  Administered 2015-09-21: 1000 mL via INTRAVENOUS

## 2015-09-21 MED ORDER — LEVOFLOXACIN IN D5W 750 MG/150ML IV SOLN
750.0000 mg | Freq: Once | INTRAVENOUS | Status: AC
  Administered 2015-09-21: 750 mg via INTRAVENOUS
  Filled 2015-09-21: qty 150

## 2015-09-21 MED ORDER — CYCLOBENZAPRINE HCL 10 MG PO TABS
10.0000 mg | ORAL_TABLET | Freq: Three times a day (TID) | ORAL | Status: DC | PRN
  Administered 2015-09-23: 10 mg via ORAL
  Filled 2015-09-21: qty 1

## 2015-09-21 MED ORDER — SODIUM CHLORIDE 0.9 % IV SOLN
INTRAVENOUS | Status: DC
Start: 2015-09-21 — End: 2015-09-24
  Administered 2015-09-21 – 2015-09-23 (×4): via INTRAVENOUS

## 2015-09-21 MED ORDER — LEVOFLOXACIN IN D5W 750 MG/150ML IV SOLN
750.0000 mg | INTRAVENOUS | Status: DC
  Administered 2015-09-22 – 2015-09-24 (×4): 750 mg via INTRAVENOUS
  Filled 2015-09-21 (×3): qty 150

## 2015-09-21 MED ORDER — METRONIDAZOLE IN NACL 5-0.79 MG/ML-% IV SOLN
500.0000 mg | Freq: Once | INTRAVENOUS | Status: AC
  Administered 2015-09-21: 500 mg via INTRAVENOUS
  Filled 2015-09-21: qty 100

## 2015-09-21 NOTE — ED Notes (Signed)
He tells me he had had biliary stent placement about two weeks ago; and underwent pancreatic biopsy Wed. (4 days ago).  He is here today d/t abd. Pain and fever today.  They tell me they have spoken with Dr. Loletha Carrow (gastroenterology) who told them he would see him in our E.D.  He is ambulatory and in no distress.

## 2015-09-21 NOTE — ED Notes (Signed)
I have just given report to Diana, RN on [address]; and will transport shortly. 

## 2015-09-21 NOTE — Consult Note (Signed)
Gastroenterology and Hepatology Consult Note   History Roy English MRN # [id number]  Date of Admission: 09/21/2015 Date of Consultation: 09/21/2015 Referring physician: Dr. Theodis Blaze, MD  Reason for Consultation/Chief Complaint: Abdominal Pain and fever  Subjective HPI:  This is a 65 year old man known to my partners Dr. Hilarie Fredrickson and Carlean Purl from recent evaluation. 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 He underwent recent ERCP with brushings and 10 French/5 cm stent placement. The patient had an expedited referral to [facility], where he underwent EUS 4 days ago. The report is not currently available, I am depending upon the family's report. It sounds as if a biopsy was taken, but they were not given a clear answer on whether or not it was definitely pancreatic cancer. She was feeling well after the procedure, with no change in his chronic epigastric pain. He was sent home with tramadol. He woke this morning with a fever to 101 at home and worsening epigastric to right upper quadrant pain. He was radiating through to the back as previous pain, no triggers or relieving factors. He also said his urine had turned dark. With all of this, I advised him to come to the ED.  ROS:  Constitutional:  No weight loss Skin:   Chronic psoriasis   All other systems are negative except as noted above in the HPI  Past Medical History Past Medical History  Diagnosis Date  . Environmental allergies   . Arthritis   . ED (erectile dysfunction)   . Actinic keratosis   . 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 Dilated cbd, acquired   . Elevated LFTs     Past Surgical History Past Surgical History  Procedure Laterality Date  . Multiple surgeries      ribs,arms,nose,fingers  . Ac separation surgery Bilateral   . Knee arthroscopy Right    . Carpal tunnel surgery Bilateral   . Skin cancer removal       face and chest  . Ercp N/A 09/08/2015    Procedure: ENDOSCOPIC RETROGRADE CHOLANGIOPANCREATOGRAPHY (ERCP);  Surgeon: Gatha Mayer, MD;  Location: [facility];  Service: Endoscopy;  Laterality: N/A;   I personally reviewed the ERCP report. Regarding the CT scan noted below, it is worth noting that the  biliary stent on the endoscopic photos appears to have been left somewhat, " long", probably because of the tightness of the stricture. Family History Family History  Problem Relation Age of Onset  . Heart disease Mother   . Rheum arthritis Mother   . Cancer      both sides of the family, type unknown    Social History Social History   Social History  . Marital Status: Married    Spouse Name: N/A  . Number of Children: 2  . Years of Education: N/A   Occupational History  . Land    Social History Main Topics  . Smoking status: Never Smoker   . Smokeless tobacco: Never Used  . Alcohol Use: 0.0 oz/week    0 Standard drinks or equivalent per week     Comment: 6 beer daily-heavy on the weekends  . Drug Use: No  . Sexual Activity: Not Asked   Other Topics Concern  . None   Social History Narrative    Allergies Allergies  Allergen Reactions  . Penicillins Other (See Comments)  Unknown reaction as a baby. Has patient had a PCN reaction causing immediate rash, facial/tongue/throat swelling, SOB or lightheadedness with hypotension: unknown Has patient had a PCN reaction causing severe rash involving mucus membranes or skin necrosis: uknown Has patient had a PCN reaction that required hospitalization: unknown Has patient had a PCN reaction occurring within the last 10 years: Yes If all of the above answers are "NO", then may proceed with Cephalosporin use.     Outpatient Meds Outpatient meds reviewed  Inpatient med list  reviewed  _____________________________________________________________________ Objective  Exam:  Current vital signs  Patient Vitals for the past 8 hrs:  BP Temp Temp src Pulse Resp SpO2 Height Weight  09/21/15 1612 98/59 mmHg - - 60 18 96 % - -  09/21/15 1513 100/56 mmHg - - (!) 51 20 97 % - -  09/21/15 1443 90/59 mmHg - - 65 19 96 % - -  09/21/15 1401 90/59 mmHg - - (!) 58 19 99 % - -  09/21/15 1357 96/63 mmHg - - (!) 59 21 99 % - -  09/21/15 1229 (!) 62/59 mmHg - - 69 22 97 % - -  09/21/15 1157 (!) 87/60 mmHg - - 73 (!) 91 95 % 5\' 9"  (1.753 m) 69.4 kg (153 lb)  09/21/15 1109 92/64 mmHg 98.4 F (36.9 C) Oral 92 20 96 % - -    Intake/Output Summary (Last 24 hours) at 09/21/15 1644 Last data filed at 09/21/15 1559  Gross per 24 hour  Intake   2500 ml  Output      0 ml  Net   2500 ml    Physical Exam:  General: this is a middle-aged white male patient in no acute distress  Eyes: sclera anicteric, no redness  ENT: oral mucosa moist without lesions, no cervical or supraclavicular lymphadenopathy, good dentition  CV: RRR without murmur, S1/S2, no JVD,, no peripheral edema  Resp: clear to auscultation bilaterally, normal RR and effort noted  GI: soft, mild epigastric tenderness, with active bowel sounds. No guarding or palpable organomegaly noted  Skin; warm and dry, no rash or jaundice noted  Neuro: awake, alert and oriented x 3. Normal gross motor function and fluent speech.  Labs:   Recent Labs Lab 09/16/15 0949 09/21/15 1140  WBC 4.4 10.0  HGB 14.5 14.1  HCT 42.4 40.5  PLT 187.0 153    Recent Labs Lab 09/21/15 1140  NA 135  K 3.2*  CL 102  CO2 22  BUN 14  ALBUMIN 4.4  ALKPHOS 553*  ALT 148*  AST 159*  GLUCOSE 91   Lipase     Component Value Date/Time   LIPASE 51 09/21/2015 1140     These LFTs are down from before the ERCP, but up from what they were on 09/16/2015. Radiologic studies:  CT scan abdomen and pelvis with contrast today  shows the biliary stent extends from the mid to distal CBD into the third portion of the duodenum. It is difficult to say whether that represents a migration after ERCP. The previously noted pancreatic head mass is also there with no evidence of pancreatitis. No soft tissue inflammation posterior to the stomach or adjacent to the pancreas. There is also an incidental right renal mass worrisome for possible RCC @ASSESSMENTPLANBEGIN @ Impression:  Acute on chronic epigastric to right upper quadrant pain Elevated LFTs, somewhat worse than last week. Pancreatic head mass, worrisome for carcinoma Fever Renal mass  There does not appear to have been a consultation  from the EUS and biopsy such as pancreatitis or abscess. Blood cultures have been collected to look for bacteremia. I'm concerned that the stent has either partially migrated, partially occluded or both.  As such, there may be low-grade cholangitis to explain the fever and elevated LFTs. He is certainly not septic or toxic in appearance.  Plan:  The patient is on fluoroquinolone because a reported penicillin allergy. That allergy is somewhat unclear since childhood. If he does not defervesce, he should be put on Zosyn. IV fluids, diet as tolerated, pain control I will speak with Dr. Hilarie Fredrickson, I expect that he will contact [facility] tomorrow to get the report of the EUS.  Whether or not a repeat ERCP for stent replacement is necessary depends entirely upon the possible surgical plan. The patient appears to be a surgical candidate for the near future., he may not need another stent. Naturally, the renal mass must be addressed and a plan composed prior to any possible surgical plan for this patient's pancreatic mass   Dr. Havery Moros will be attending tomorrow, and I will give him sign out on this patient. Thank you for the courtesy of this consult.  Please contact me with any questions or concerns.  Nelida Meuse III Pager: [phone] Mon-Fri  8a-5p [phone] after 5p, weekends, holidays

## 2015-09-21 NOTE — ED Notes (Signed)
Pt cannot use restroom at this time, aware urine specimen is needed.  

## 2015-09-21 NOTE — ED Provider Notes (Signed)
CSN: [id number]     Arrival date & time 09/21/15  1029 History   First MD Initiated Contact with Patient 09/21/15 1133     Chief Complaint  Patient presents with  . Abdominal Pain  . Fever  . Post-op Problem    Patient is a 65 y.o. male presenting with abdominal pain and fever. The history is provided by the patient.  Abdominal Pain Pain location:  Generalized Pain quality: aching   Pain radiates to:  Back Pain severity:  Severe Onset quality:  Gradual Duration:  1 day Timing:  Constant Progression:  Improving Chronicity:  New Relieved by: pain meds. Worsened by:  Nothing tried Associated symptoms: fever   Associated symptoms: no chest pain, no cough, no diarrhea, no dysuria and no vomiting   Fever Associated symptoms: no chest pain, no cough, no diarrhea, no dysuria and no vomiting   pt with h/o recent biliary stent placement by GI He also had recent EUS with biopsy at [facility] He is known to have pancreatic mass Over past 24 hours he has had increased abd pain and had fever up to 101 today No cough/sore throat reported No vomiting reported  Past Medical History  Diagnosis Date  . Environmental allergies   . Arthritis   . ED (erectile dysfunction)   . Actinic keratosis   . 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 Dilated cbd, acquired   . Elevated LFTs    Past Surgical History  Procedure Laterality Date  . Multiple surgeries      ribs,arms,nose,fingers  . Ac separation surgery Bilateral   . Knee arthroscopy Right   . Carpal tunnel surgery Bilateral   . Skin cancer removal       face and chest  . Ercp N/A 09/08/2015    Procedure: ENDOSCOPIC RETROGRADE CHOLANGIOPANCREATOGRAPHY (ERCP);  Surgeon: Gatha Mayer, MD;  Location: [facility];  Service: Endoscopy;  Laterality: N/A;   Family History  Problem Relation Age of Onset  . Heart disease Mother   . Rheum arthritis Mother   . Cancer      both sides of the family, type unknown   Social  History  Substance Use Topics  . Smoking status: Never Smoker   . Smokeless tobacco: Never Used  . Alcohol Use: 0.0 oz/week    0 Standard drinks or equivalent per week     Comment: 6 beer daily-heavy on the weekends    Review of Systems  Constitutional: Positive for fever.  Respiratory: Negative for cough.   Cardiovascular: Negative for chest pain.  Gastrointestinal: Positive for abdominal pain. Negative for vomiting and diarrhea.  Genitourinary: Negative for dysuria.  All other systems reviewed and are negative.     Allergies  Penicillins  Home Medications   Prior to Admission medications   Medication Sig Start Date End Date Taking? Authorizing Provider  cyclobenzaprine (FLEXERIL) 10 MG tablet Take 1 tablet (10 mg total) by mouth 3 (three) times daily as needed for muscle spasms. 08/29/15  Yes Megan P Johnson, DO  Glucosamine-Chondroitin (GLUCOSAMINE CHONDR COMPLEX PO) Take 2 capsules by mouth daily.    Yes Historical Provider, MD  naproxen sodium (ALEVE) 220 MG tablet Take 440 mg by mouth every morning.   Yes Historical Provider, MD  traMADol (ULTRAM) 50 MG tablet TAKE 1 TO 2 TABLETS BY MOUTH EVERY 4 TO 6 HOURS AS NEEDED FOR PAIN 09/18/15  Yes Gatha Mayer, MD  atorvastatin (LIPITOR) 40 MG tablet Take 1 tablet (40 mg  total) by mouth daily. Patient not taking: Reported on 09/21/2015 07/10/15   Guadalupe Maple, MD  sildenafil (VIAGRA) 100 MG tablet Take 1 tablet (100 mg total) by mouth daily as needed for erectile dysfunction. 07/10/15   Guadalupe Maple, MD   BP 87/60 mmHg  Pulse 73  Temp(Src) 98.4 F (36.9 C) (Oral)  Resp 91  Ht 5\' 9"  (1.753 m)  Wt 69.4 kg  BMI 22.58 kg/m2  SpO2 95% Physical Exam CONSTITUTIONAL: Well developed/well nourished HEAD: Normocephalic/atraumatic EYES: EOMI/PERRL, no significant icterus noted ENMT: Mucous membranes moist NECK: supple no meningeal signs SPINE/BACK:entire spine nontender CV: S1/S2 noted, no murmurs/rubs/gallops noted LUNGS:  Lungs are clear to auscultation bilaterally, no apparent distress ABDOMEN: soft, nontender, no rebound or guarding, bowel sounds noted throughout abdomen NEURO: Pt is awake/alert/appropriate, moves all extremitiesx4.  No facial droop.   EXTREMITIES: pulses normal/equal, full ROM, no LE edema SKIN: warm, color normal PSYCH: no abnormalities of mood noted, alert and oriented to situation  ED Course  Procedures  CRITICAL CARE Performed by: Sharyon Cable Total critical care time: 35 minutes Critical care time was exclusive of separately billable procedures and treating other patients. Critical care was necessary to treat or prevent imminent or life-threatening deterioration. Critical care was time spent personally by me on the following activities: development of treatment plan with patient and/or surrogate as well as nursing, discussions with consultants, evaluation of patient's response to treatment, examination of patient, obtaining history from patient or surrogate, ordering and performing treatments and interventions, ordering and review of laboratory studies, ordering and review of radiographic studies, pulse oximetry and re-evaluation of patient's condition.  12:09 PM Due to h/o bilary stricture, fever at home and hypotension, code sepsis called.  IV fluids/antibiotics ordered Will follow closely 2:05 PM D/w GI, they called in, aware of patient, awaiting Ct imaging Likely need triad admit\  Suspect episode of hypotension error as most other SBP in the 90s 3:49 PM Pt improved CT shows biliary stent migration Will need admission D/w dr Loletha Carrow with GI he is aware of patient D/w dr Doyle Askew will admit to medical service BP is improved Pt improved/stabilized in the ED   Labs Review Labs Reviewed  COMPREHENSIVE METABOLIC PANEL - Abnormal; Notable for the following:    Potassium 3.2 (*)    Calcium 8.8 (*)    AST 159 (*)    ALT 148 (*)    Alkaline Phosphatase 553 (*)    Total  Bilirubin 1.8 (*)    All other components within normal limits  URINALYSIS, ROUTINE W REFLEX MICROSCOPIC (NOT AT [facility]) - Abnormal; Notable for the following:    Ketones, ur 15 (*)    All other components within normal limits  CULTURE, BLOOD (ROUTINE X 2)  CULTURE, BLOOD (ROUTINE X 2)  LIPASE, BLOOD  CBC  I-STAT CG4 LACTIC ACID, ED  I-STAT CG4 LACTIC ACID, ED    Imaging Review Ct Abdomen Pelvis W Contrast  09/21/2015  CLINICAL DATA:  biliary stent placement about two weeks ago; and underwent pancreatic biopsy Wed. (4 days ago). He is here today d/t abd. Pain and fever today. EXAM: CT ABDOMEN AND PELVIS WITH CONTRAST TECHNIQUE: Multidetector CT imaging of the abdomen and pelvis was performed using the standard protocol following bolus administration of intravenous contrast. CONTRAST:  164mL OMNIPAQUE IOHEXOL 300 MG/ML SOLN, 90mL OMNIPAQUE IOHEXOL 300 MG/ML SOLN COMPARISON:  MRI, 09/06/2015 FINDINGS: Lung bases: Heterogeneous areas of lung base interstitial fibrosis. No evidence of pneumonia or edema. Heart normal  in size. Hepatobiliary: No liver mass or lesion. There is intra and extrahepatic bile duct dilation despite the presence of a short biliary stent. Stent extends from the mid to distal duct into the third portion of the duodenum. Gallbladder is distended but otherwise unremarkable. Pancreas: 2.9 cm hypo attenuating pancreatic head mass unchanged from the prior MRI. Mild dilation of pancreatic duct, also stable. No pancreatic inflammation. Spleen: Unremarkable. Adrenal glands, kidneys, ureters, bladder: No adrenal masses. 14 mm posterior right midpole renal mass. This appears to be a solid enhancing mass. Small nonobstructing stone in the midpole the left kidney. No other renal abnormality. Normal ureters. Bladder is unremarkable. Lymph nodes:  No adenopathy. Ascites:  None. Gastrointestinal: Stomach and small bowel are unremarkable. Mild increased stool in the colon. Colon otherwise  unremarkable. Normal appendix visualized. Musculoskeletal: Hemangioma in the T12 vertebrae with slight depression of the upper endplate. Mild degenerative changes most evident at L5-S1. No osteoblastic or osteolytic lesions. IMPRESSION: 1. Despite the presence of the biliary stent, there is intra and extrahepatic bile duct dilation. This suggests stent malfunction. The biliary stent extends beyond the ampulla of Vater, and may have migrated distally since its insertion. Tip of the stent projects in the third portion of the duodenum. 2. 14 mm enhancing posterior midpole right renal mass. This is suspicious for small renal cell carcinoma. Electronically Signed   By: Lajean Manes M.D.   On: 09/21/2015 14:32   Dg Chest Portable 1 View  09/21/2015  CLINICAL DATA:  Woke up with new pain and fever. 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 Awaiting needle biopsy from tumor, due back tomorrow. Was told to watch out for fever and increased pain d/t risk of pancreatitis, states his urine is also getting darker.Nonsmoker. EXAM: PORTABLE CHEST 1 VIEW COMPARISON:  09/25/2012 FINDINGS: Cardiac silhouette is normal in size. No mediastinal or hilar masses or convincing adenopathy. Mild chronic scarring in the lung bases. Lungs otherwise clear with no evidence of pneumonia or edema. No pleural effusion or pneumothorax. There are old healed rib fractures on the right. IMPRESSION: No acute cardiopulmonary disease. Electronically Signed   By: Lajean Manes M.D.   On: 09/21/2015 12:28   I have personally reviewed and evaluated these images and lab results as part of my medical decision-making.   Medications  sodium chloride 0.9 % bolus 1,000 mL (0 mLs Intravenous Stopped 09/21/15 1402)    Followed by  sodium chloride 0.9 % bolus 500 mL (500 mLs Intravenous New Bag/Given 09/21/15 1403)  levofloxacin (LEVAQUIN) IVPB 750 mg (not administered)  metroNIDAZOLE (FLAGYL) IVPB 500 mg  (not administered)  sodium chloride 0.9 % bolus 1,000 mL (0 mLs Intravenous Stopped 09/21/15 1258)  levofloxacin (LEVAQUIN) IVPB 750 mg (750 mg Intravenous New Bag/Given 09/21/15 1223)  metroNIDAZOLE (FLAGYL) IVPB 500 mg (500 mg Intravenous New Bag/Given 09/21/15 1224)  morphine 4 MG/ML injection 4 mg (4 mg Intravenous Given 09/21/15 1258)  iohexol (OMNIPAQUE) 300 MG/ML solution 100 mL (100 mLs Intravenous Contrast Given 09/21/15 1411)  iohexol (OMNIPAQUE) 300 MG/ML solution 25 mL (25 mLs Oral Contrast Given 09/21/15 1411)    MDM   Final diagnoses:  Sepsis, due to unspecified organism [facility])  Biliary stent migration, initial encounter    Nursing notes including past medical history and social history reviewed and considered in documentation Labs/vital reviewed myself and considered during evaluation xrays/imaging reviewed by myself and considered during evaluation     Ripley Fraise, MD 09/21/15 1550

## 2015-09-21 NOTE — ED Notes (Signed)
Woke up with new pain and fever. 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 Awaiting needle biopsy from tumor, due back tomorrow. Was told to watch out for fever and increased pain d/t risk of pancreatitis, states his urine is also getting darker.

## 2015-09-21 NOTE — ED Notes (Signed)
I have notified [facility]--they will call back shortly for report.

## 2015-09-21 NOTE — H&P (Signed)
[facility] History and Physical  TYMEER TURKOVICH [id number] DOB: Jun 06, 1951 DOA: 09/21/2015  Referring physician: ED physician, Dr. Christy Gentles PCP: Golden Pop, MD   Chief Complaint: abd pain   HPI:  65 year old male who recently saw GI doctor for evaluation of elevated LFTS, weight loss, underwent ERCP 09/08/2015 which showed pancreatic head mass and biliary ductal dilatation, suggestive of cancer but specimens sent for pathology at that time with no specific indication of malignant process. Pt also underwent stent placement at that time and had an expedited referral to [facility], where he underwent EUS 4 days PTA. Pt reports he woke up with fever of 101 at home and RUQ abd pain, initially intermittent but progressed to constant pain and 7/10 in intensity, occasionally but not consistently radiating to the epigastric area and back, no specific alleviating factors, worse with eating or drinking fluids, associated with nausea and poor oral intake.   In ED, pt noted to be hemodynamically stable, VSS, blood work notable for worsening transaminitis compared to 2/14. K level 3.2. [facility] asked to admit for further evaluation and GI team consulted for assistance.   Assessment and Plan:  Principal Problem:   Acute abdominal pain /  Elevated liver enzymes / Common bile duct stricture / Pancreatic mass - ? Stent malfunction - appreciate GI team input, will follow recommendations - provide supportive care - IVF, analgesia and antiemetics as needed     Interstitial lung disease ([place]) - stable respiratory status     Hypokalemia - supplement and repeat BMP in AM  Lovenox SQ for DVT prophylaxis    Radiological Exams on Admission:  Ct Abdomen Pelvis W Contrast 09/21/2015 Despite the presence of the biliary stent, there is intra and extrahepatic bile duct dilation. This suggests stent malfunction. The biliary stent extends beyond the ampulla of Vater, and may have migrated distally since  its insertion. Tip of the stent projects in the third portion of the duodenum. 2. 14 mm enhancing posterior midpole right renal mass. This is suspicious for small renal cell carcinoma.   Dg Chest Portable 1 View 09/21/2015  No acute cardiopulmonary disease.   Code Status: Full Family Communication: Pt  And family at bedside Disposition Plan: Admit for further evaluation    Mart Piggs [facility] [id number]   Review of Systems:  Constitutional:  Negative for diaphoresis.  HENT: Negative for hearing loss, ear pain, nosebleeds, congestion, sore throat, neck pain, tinnitus and ear discharge.   Eyes: Negative for blurred vision, double vision, photophobia, pain, discharge and redness.  Respiratory: Negative for cough, hemoptysis, sputum production, shortness of breath, wheezing and stridor.   Cardiovascular: Negative for chest pain, palpitations, orthopnea, claudication and leg swelling.  Gastrointestinal: Per HPI Genitourinary: Negative for dysuria, urgency, frequency, hematuria and flank pain.  Musculoskeletal: Negative for myalgias, back pain, joint pain and falls.  Skin: Negative for itching and rash.  Neurological: Negative for dizziness and weakness.  Endo/Heme/Allergies: Negative for environmental allergies and polydipsia. Does not bruise/bleed easily.  Psychiatric/Behavioral: Negative for suicidal ideas. The patient is not nervous/anxious.      Past Medical History  Diagnosis Date  . Environmental allergies   . Arthritis   . ED (erectile dysfunction)   . Actinic keratosis   . 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 Dilated cbd, acquired   . Elevated LFTs     Past Surgical History  Procedure Laterality Date  . Multiple surgeries      ribs,arms,nose,fingers  . Ac separation surgery Bilateral   .  Knee arthroscopy Right   . Carpal tunnel surgery Bilateral   . Skin cancer removal       face and chest  . Ercp N/A 09/08/2015    Procedure: ENDOSCOPIC RETROGRADE  CHOLANGIOPANCREATOGRAPHY (ERCP);  Surgeon: Gatha Mayer, MD;  Location: [facility];  Service: Endoscopy;  Laterality: N/A;    Social History:  reports that he has never smoked. He has never used smokeless tobacco. He reports that he drinks alcohol. He reports that he does not use illicit drugs.  Allergies  Allergen Reactions  . Penicillins Other (See Comments)    Unknown reaction as a baby. Has patient had a PCN reaction causing immediate rash, facial/tongue/throat swelling, SOB or lightheadedness with hypotension: unknown Has patient had a PCN reaction causing severe rash involving mucus membranes or skin necrosis: uknown Has patient had a PCN reaction that required hospitalization: unknown Has patient had a PCN reaction occurring within the last 10 years: Yes If all of the above answers are "NO", then may proceed with Cephalosporin use.     Family History  Problem Relation Age of Onset  . Heart disease Mother   . Rheum arthritis Mother   . Cancer      both sides of the family, type unknown    Prior to Admission medications   Medication Sig Start Date End Date Taking? Authorizing Provider  cyclobenzaprine (FLEXERIL) 10 MG tablet Take 1 tablet (10 mg total) by mouth 3 (three) times daily as needed for muscle spasms. 08/29/15  Yes Megan P Johnson, DO  Glucosamine-Chondroitin (GLUCOSAMINE CHONDR COMPLEX PO) Take 2 capsules by mouth daily.    Yes Historical Provider, MD  naproxen sodium (ALEVE) 220 MG tablet Take 440 mg by mouth every morning.   Yes Historical Provider, MD  traMADol (ULTRAM) 50 MG tablet TAKE 1 TO 2 TABLETS BY MOUTH EVERY 4 TO 6 HOURS AS NEEDED FOR PAIN 09/18/15  Yes Gatha Mayer, MD  atorvastatin (LIPITOR) 40 MG tablet Take 1 tablet (40 mg total) by mouth daily. Patient not taking: Reported on 09/21/2015 07/10/15   Guadalupe Maple, MD  sildenafil (VIAGRA) 100 MG tablet Take 1 tablet (100 mg total) by mouth daily as needed for erectile dysfunction. 07/10/15   Guadalupe Maple, MD    Physical Exam: Filed Vitals:   09/21/15 1401 09/21/15 1443 09/21/15 1513 09/21/15 1612  BP: 90/59 90/59 100/56 98/59  Pulse: 58 65 51 60  Temp:      TempSrc:      Resp: 19 19 20 18   Height:      Weight:      SpO2: 99% 96% 97% 96%    Physical Exam  Constitutional: Appears well-developed and well-nourished. No distress.  HENT: Normocephalic. External right and left ear normal. Dry MM Eyes: Conjunctivae and EOM are normal. PERRLA, no scleral icterus.  Neck: Normal ROM. Neck supple. No JVD. No tracheal deviation. No thyromegaly.  CVS: RRR, S1/S2 +, no murmurs, no gallops, no carotid bruit.  Pulmonary: Effort and breath sounds normal, no stridor, rhonchi, wheezes, rales.  Abdominal: Soft. BS +,  no distension, tenderness in epigastric area, no rebound or guarding.  Musculoskeletal: Normal range of motion. No edema and no tenderness.  Lymphadenopathy: No lymphadenopathy noted, cervical, inguinal. Neuro: Alert. Normal reflexes, muscle tone coordination. No cranial nerve deficit. Skin: Skin is warm and dry. No rash noted. Not diaphoretic. No erythema. No pallor.  Psychiatric: Normal mood and affect. Behavior, judgment, thought content normal.   Labs on Admission:  Basic Metabolic Panel:  Recent Labs Lab 09/16/15 0949 09/21/15 1140  NA 138 135  K 4.3 3.2*  CL 101 102  CO2 32 22  GLUCOSE 112* 91  BUN 14 14  CREATININE 0.73 0.73  CALCIUM 9.3 8.8*   Liver Function Tests:  Recent Labs Lab 09/16/15 0949 09/21/15 1140  AST 27 159*  ALT 69* 148*  ALKPHOS 382* 553*  BILITOT 1.5* 1.8*  PROT 6.9 7.0  ALBUMIN 4.4 4.4    Recent Labs Lab 09/16/15 0949 09/21/15 1140  LIPASE 71.0* 51  AMYLASE 35  --    CBC:  Recent Labs Lab 09/16/15 0949 09/21/15 1140  WBC 4.4 10.0  NEUTROABS 2.8  --   HGB 14.5 14.1  HCT 42.4 40.5  MCV 88.7 87.7  PLT 187.0 153   EKG: pending   If 7PM-7AM, please contact night-coverage [email or web address] Password [facility] 09/21/2015,  5:32 PM

## 2015-09-21 NOTE — Progress Notes (Signed)
Pharmacy Antibiotic Note  Roy English is a 65 y.o. male admitted on 09/21/2015 with intra-abdominal infection.   He is s/p ERCP on 09/18/15 and states he had pancreatic bx on 09/17/15. Pharmacy has been consulted for Levaquin & Flagyl dosing.   Plan: Levaquin 750mg  IV q24h  Flagyl 500mg  IV q8h No further dose adjustments anticipated.  Pharmacy will sign off.  Please re-consult if needed.      Temp (24hrs), Avg:98.4 F (36.9 C), Min:98.4 F (36.9 C), Max:98.4 F (36.9 C)   Recent Labs Lab 09/16/15 0949 09/21/15 1140 09/21/15 1145  WBC 4.4 10.0  --   CREATININE 0.73  --   --   LATICACIDVEN  --   --  0.75    Estimated Creatinine Clearance: 91.4 mL/min (by C-G formula based on Cr of 0.73).    Allergies  Allergen Reactions  . Penicillins Other (See Comments)    Unknown reaction as a baby. Has patient had a PCN reaction causing immediate rash, facial/tongue/throat swelling, SOB or lightheadedness with hypotension: unknown Has patient had a PCN reaction causing severe rash involving mucus membranes or skin necrosis: uknown Has patient had a PCN reaction that required hospitalization: unknown Has patient had a PCN reaction occurring within the last 10 years: Yes If all of the above answers are "NO", then may proceed with Cephalosporin use.     Antimicrobials this admission: Levaquin 2/19 >>  Flagyl 2/19 >>   Dose adjustments this admission:  Microbiology results: 2/19 BCx: sent  Thank you for allowing pharmacy to be a part of this patient's care.  Biagio Borg 09/21/2015 11:52 AM

## 2015-09-22 DIAGNOSIS — K831 Obstruction of bile duct: Principal | ICD-10-CM

## 2015-09-22 DIAGNOSIS — R748 Abnormal levels of other serum enzymes: Secondary | ICD-10-CM

## 2015-09-22 DIAGNOSIS — R100 Acute abdomen: Secondary | ICD-10-CM

## 2015-09-22 DIAGNOSIS — T85520A Displacement of bile duct prosthesis, initial encounter: Secondary | ICD-10-CM

## 2015-09-22 LAB — COMPREHENSIVE METABOLIC PANEL
ALBUMIN: 3.4 g/dL — AB (ref 3.5–5.0)
ALK PHOS: 405 U/L — AB (ref 38–126)
ALT: 90 U/L — AB (ref 17–63)
ANION GAP: 7 (ref 5–15)
AST: 69 U/L — ABNORMAL HIGH (ref 15–41)
BUN: 7 mg/dL (ref 6–20)
CALCIUM: 8.3 mg/dL — AB (ref 8.9–10.3)
CHLORIDE: 105 mmol/L (ref 101–111)
CO2: 26 mmol/L (ref 22–32)
Creatinine, Ser: 0.58 mg/dL — ABNORMAL LOW (ref 0.61–1.24)
GFR calc Af Amer: >60 mL/min (ref >60)
GFR calc non Af Amer: >60 mL/min (ref >60)
GLUCOSE: 110 mg/dL — AB (ref 65–99)
Potassium: 3.2 mmol/L — ABNORMAL LOW (ref 3.5–5.1)
SODIUM: 138 mmol/L (ref 135–145)
Total Bilirubin: 1 mg/dL (ref 0.3–1.2)
Total Protein: 5.7 g/dL — ABNORMAL LOW (ref 6.5–8.1)

## 2015-09-22 LAB — CBC
HCT: 35.9 % — ABNORMAL LOW (ref 39.0–52.0)
HEMOGLOBIN: 12.3 g/dL — AB (ref 13.0–17.0)
MCH: 30.8 pg (ref 26.0–34.0)
MCHC: 34.3 g/dL (ref 30.0–36.0)
MCV: 90 fL (ref 78.0–100.0)
Platelets: 119 10*3/uL — ABNORMAL LOW (ref 150–400)
RBC: 3.99 MIL/uL — ABNORMAL LOW (ref 4.22–5.81)
RDW: 12.6 % (ref 11.5–15.5)
WBC: 3.9 10*3/uL — ABNORMAL LOW (ref 4.0–10.5)

## 2015-09-22 NOTE — Progress Notes (Signed)
Patient ID: Roy English, male   DOB: 1951-01-31, 65 y.o.   MRN: [id number]    Progress Note   Subjective   Still having quite a bit of pain - requiring pain meds at least q 3 hours, no nausea, no fever On Levaquin/Flagyl BC pending   Objective   Vital signs in last 24 hours: Temp:  [98.4 F (36.9 C)-99.7 F (37.6 C)] 98.8 F (37.1 C) (02/20 0515) Pulse Rate:  [51-92] 53 (02/20 0515) Resp:  [16-91] 16 (02/20 0515) BP: (62-119)/(52-64) 90/52 mmHg (02/20 0515) SpO2:  [95 %-99 %] 96 % (02/20 0515) Weight:  [69.4 kg (153 lb)] 69.4 kg (153 lb) (02/19 1157) Last BM Date: 09/21/15 General:    white male  in NAD- looks uncomfortable- family at bedside Heart:  Regular rate and rhythm; no murmurs Lungs: Respirations even and unlabored, lungs CTA bilaterally Abdomen:  Soft, tender  Epigastrium,and nondistended. Normal bowel sounds. Extremities:  Without edema. Neurologic:  Alert and oriented,  grossly normal neurologically. Psych:  Cooperative. Normal mood and affect.  Intake/Output from previous day: 02/19 0701 - 02/20 0700 In: 3773.8 [P.O.:480; I.V.:3193.8; IV Piggyback:100] Out: 1450 [Urine:1450] Intake/Output this shift: Total I/O In: 267.5 [I.V.:267.5] Out: 875 [Urine:875]  Lab Results:  Recent Labs  09/21/15 1140 09/21/15 1812 09/22/15 0350  WBC 10.0 6.8 3.9*  HGB 14.1 12.5* 12.3*  HCT 40.5 36.5* 35.9*  PLT 153 114* 119*   BMET  Recent Labs  09/21/15 1140 09/21/15 1812 09/22/15 0350  NA 135 137 138  K 3.2* 3.7 3.2*  CL 102 104 105  CO2 22 26 26   GLUCOSE 91 132* 110*  BUN 14 8 7   CREATININE 0.73 0.75 0.58*  CALCIUM 8.8* 8.2* 8.3*   LFT  Recent Labs  09/22/15 0350  PROT 5.7*  ALBUMIN 3.4*  AST 69*  ALT 90*  ALKPHOS 405*  BILITOT 1.0   PT/INR  Recent Labs  09/21/15 1812  LABPROT 16.8*  INR 1.35    Studies/Results: Ct Abdomen Pelvis W Contrast  09/21/2015  CLINICAL DATA:  biliary stent placement about two weeks ago; and underwent  pancreatic biopsy Wed. (4 days ago). He is here today d/t abd. Pain and fever today. EXAM: CT ABDOMEN AND PELVIS WITH CONTRAST TECHNIQUE: Multidetector CT imaging of the abdomen and pelvis was performed using the standard protocol following bolus administration of intravenous contrast. CONTRAST:  160mL OMNIPAQUE IOHEXOL 300 MG/ML SOLN, 35mL OMNIPAQUE IOHEXOL 300 MG/ML SOLN COMPARISON:  MRI, 09/06/2015 FINDINGS: Lung bases: Heterogeneous areas of lung base interstitial fibrosis. No evidence of pneumonia or edema. Heart normal in size. Hepatobiliary: No liver mass or lesion. There is intra and extrahepatic bile duct dilation despite the presence of a short biliary stent. Stent extends from the mid to distal duct into the third portion of the duodenum. Gallbladder is distended but otherwise unremarkable. Pancreas: 2.9 cm hypo attenuating pancreatic head mass unchanged from the prior MRI. Mild dilation of pancreatic duct, also stable. No pancreatic inflammation. Spleen: Unremarkable. Adrenal glands, kidneys, ureters, bladder: No adrenal masses. 14 mm posterior right midpole renal mass. This appears to be a solid enhancing mass. Small nonobstructing stone in the midpole the left kidney. No other renal abnormality. Normal ureters. Bladder is unremarkable. Lymph nodes:  No adenopathy. Ascites:  None. Gastrointestinal: Stomach and small bowel are unremarkable. Mild increased stool in the colon. Colon otherwise unremarkable. Normal appendix visualized. Musculoskeletal: Hemangioma in the T12 vertebrae with slight depression of the upper endplate. Mild degenerative changes most evident at  L5-S1. No osteoblastic or osteolytic lesions. IMPRESSION: 1. Despite the presence of the biliary stent, there is intra and extrahepatic bile duct dilation. This suggests stent malfunction. The biliary stent extends beyond the ampulla of Vater, and may have migrated distally since its insertion. Tip of the stent projects in the third portion  of the duodenum. 2. 14 mm enhancing posterior midpole right renal mass. This is suspicious for small renal cell carcinoma. Electronically Signed   By: Lajean Manes M.D.   On: 09/21/2015 14:32   Dg Chest Portable 1 View  09/21/2015  CLINICAL DATA:  Woke up with new pain and fever. 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 Awaiting needle biopsy from tumor, due back tomorrow. Was told to watch out for fever and increased pain d/t risk of pancreatitis, states his urine is also getting darker.Nonsmoker. EXAM: PORTABLE CHEST 1 VIEW COMPARISON:  09/25/2012 FINDINGS: Cardiac silhouette is normal in size. No mediastinal or hilar masses or convincing adenopathy. Mild chronic scarring in the lung bases. Lungs otherwise clear with no evidence of pneumonia or edema. No pleural effusion or pneumothorax. There are old healed rib fractures on the right. IMPRESSION: No acute cardiopulmonary disease. Electronically Signed   By: Lajean Manes M.D.   On: 09/21/2015 12:28       Assessment / Plan:    #1 65 yo WM with new finding of pancreatic mass with biliary obstruction- s/p ERCP and stent 2/6, brushings non diagnostic. EUS and BX 2/15 done at [facility] (Dr Francella Solian)-  Pt felt ok until yesterday when developed increased pain, fever,weakness  Pt was hypotensive on arrival- suspect early sepsis / cholangitis - BC pending-ON Levaquin/Flagyl ?stent migration/occlusion WBC low, and plts low today -concerning for sepsis  Continue pain control Iv ABX -consider change to Primaxin Have contacted [facility] for EUS report - cytology is still pending -should be out today Discussed with pt/family - if he is a surgical candidate  He wants to go to [facility].  Addendum- report from EUS 09/17/15- 39mm by 27.9 mm hypoechoic mass pancreatic head, intact interface between mass and adjacent structures suggesting lack of invasion, fine needle bx with 4 passes. No involvement of SMA or celiac artery  seen, no definite involvement of PV or SMV, dilated CBD to 10.5 mm , no lymph nodes  Visualized    Addendum- discussed with dr Ardis Hughs, and he reviewed case and feels stent migration explains pain, fever etc with developing cholangitis.  Discussed with pt and family . Will proceed with ERCP and stent exchange tomorrow at 1 pm with Dr Ardis Hughs Pt then has appt  with DrPappas  At [facility] on 2/28    Principal Problem:   Acute abdominal pain Active Problems:   Interstitial lung disease ([place])   Elevated liver enzymes   Common bile duct stricture   Pancreatic mass   Hypokalemia     LOS: 1 day   Amy Esterwood  09/22/2015, 8:50 AM

## 2015-09-22 NOTE — Progress Notes (Signed)
Initial Nutrition Assessment  INTERVENTION:  -Continue Ensure Enlive BID, 350 kcal, 20 grams of protein per bottle.  -RD continue to monitor for supplement acceptance   NUTRITION DIAGNOSIS:   Increased nutrient needs related to catabolic illness as evidenced by estimated needs.  GOAL:   Patient will meet greater than or equal to 90% of their needs  MONITOR:   PO intake, Supplement acceptance, Labs  REASON FOR ASSESSMENT:   Malnutrition Screening Tool  ASSESSMENT:   Pt recently saw GI doctor for evaluation of elevated LFTS, weight loss, underwent ERCP 09/08/2015 which showed pancreatic head mass and biliary ductal dilatation, suggestive of cancer but specimens sent for pathology at that time with no specific indication of malignant process  Pt seen for MST. Pt states his appetite is good. Per chart consumed 100% of breakfast. PTA pt appetite was good, he usually consumes 3 meals a day. Pt refused an Ensure this morning but stated he did not realize he need one. Encouraged the pt to try one to help increase his protein intake. Offered to switch to another protein supplement if pt dislikes Ensure Enlive. Pt reports no nausea or vomiting.   Per chart pt has had a 51 lb (25%) weight loss within 2 months. Pt states he was actively trying to loose weight PTA. He was on the Atkins diet, unable to determine whether weight loss is from dietary changes or new diagnosis.  Conducted nutrition focused physical exam, identified mild to moderate muscle wasting and no fat wasting.   Reviewed medications. Reviewed labs; potassium 3.2   Diet Order:  Diet full liquid Room service appropriate?: Yes; Fluid consistency:: Thin Diet NPO time specified  Skin:  Reviewed, no issues  Last BM:  09/21/2015  Height:   Ht Readings from Last 1 Encounters:  09/21/15 5\' 9"  (1.753 m)    Weight:   Wt Readings from Last 1 Encounters:  09/21/15 153 lb (69.4 kg)    Ideal Body Weight:  72.7 kg  BMI:  Body  mass index is 22.58 kg/(m^2).  Estimated Nutritional Needs:   Kcal:  1700-1900  Protein:  80-90  Fluid:  1.7-1.9  EDUCATION NEEDS:   No education needs identified at this time  [place], Dietetic Intern Pager: [phone]

## 2015-09-22 NOTE — Progress Notes (Signed)
   09/22/15 1400  Clinical Encounter Type  Visited With Patient and family together  Visit Type Other (Comment) (Advance Directive )  Referral From Nurse  Consult/Referral To Nurse  Advance Directives (For Healthcare)  Does patient have an advance directive? Yes  Would patient like information on creating an advanced directive? Yes - Scientist, clinical (histocompatibility and immunogenetics) given  Type of Paramedic of [place];Living will  Does patient want to make changes to advanced directive? No - Patient declined  Copy of advanced directive(s) in chart? Yes   Chaplain notarized advance directive.  Original and two copies with patient.  Copy in chart on unit.  Copy with medical records to be scanned into EPIC.    [place], [place]

## 2015-09-22 NOTE — Progress Notes (Signed)
PROGRESS NOTE  Roy English [id number] DOB: Nov 16, 1950 DOA: 09/21/2015 PCP: Golden Pop, MD  HPI/Recap of past 74 hours: 65 year old male with past medical history of recently discovered pancreatic mass who underwent an ERCP several weeks ago with stent placement and then follow-up at [facility] for biopsy admitted on 2/19 after waking up that morning with fever and some right upper quadrant pain along with pain. Patient found to have transaminitis on admission and after GI evaluation suspected migration of stent.  Today, patient feeling better with pain resolved.  No other complaints  Assessment/Plan: Principal Problem:   Acute abdominal pain with secondary transaminitis felt to be secondary to biliary stent migration: Seen by GI, for ERCP tomorrow Active Problems:   Interstitial lung disease ([place]): Stable, oxygen saturations stable    Pancreatic mass: Pathology pending, follow-up at [facility] on Monday 2/27   Hypokalemia: Secondary to nausea and vomiting, replaced    Code Status: Full code   Family Communication: Wife and sister at the bedside   Disposition Plan: Potential discharge in a few days if stent placement goes well without complications    Consultants:  Gastroenterology   Procedures:  ERCP on 2/21 planned   Antibiotics:  V Levaquin 2/19-present  IV Flagyl 2/19 -present    Objective: BP 95/58 mmHg  Pulse 58  Temp(Src) 98.2 F (36.8 C) (Oral)  Resp 18  Ht 5\' 9"  (1.753 m)  Wt 69.4 kg (153 lb)  BMI 22.58 kg/m2  SpO2 98%  Intake/Output Summary (Last 24 hours) at 09/22/15 1555 Last data filed at 09/22/15 1300  Gross per 24 hour  Intake 4521.25 ml  Output   3375 ml  Net 1146.25 ml   Filed Weights   09/21/15 1157  Weight: 69.4 kg (153 lb)    Exam:   General:  Alert and oriented 3, no acute distress   Cardiovascular: Regular rate and rhythm, S1-S2   Respiratory: Clear to auscultation bilaterally   Abdomen: Soft, nontender,  nondistended, hypoactive bowel sounds   Musculoskeletal: Trace pitting edema    Data Reviewed: Basic Metabolic Panel:  Recent Labs Lab 09/16/15 0949 09/21/15 1140 09/21/15 1812 09/22/15 0350  NA 138 135 137 138  K 4.3 3.2* 3.7 3.2*  CL 101 102 104 105  CO2 32 22 26 26   GLUCOSE 112* 91 132* 110*  BUN 14 14 8 7   CREATININE 0.73 0.73 0.75 0.58*  CALCIUM 9.3 8.8* 8.2* 8.3*   Liver Function Tests:  Recent Labs Lab 09/16/15 0949 09/21/15 1140 09/21/15 1812 09/22/15 0350  AST 27 159* 100* 69*  ALT 69* 148* 110* 90*  ALKPHOS 382* 553* 459* 405*  BILITOT 1.5* 1.8* 1.4* 1.0  PROT 6.9 7.0 5.9* 5.7*  ALBUMIN 4.4 4.4 3.6 3.4*    Recent Labs Lab 09/16/15 0949 09/21/15 1140  LIPASE 71.0* 51  AMYLASE 35  --    No results for input(s): AMMONIA in the last 168 hours. CBC:  Recent Labs Lab 09/16/15 0949 09/21/15 1140 09/21/15 1812 09/22/15 0350  WBC 4.4 10.0 6.8 3.9*  NEUTROABS 2.8  --  6.3  --   HGB 14.5 14.1 12.5* 12.3*  HCT 42.4 40.5 36.5* 35.9*  MCV 88.7 87.7 88.4 90.0  PLT 187.0 153 114* 119*   Cardiac Enzymes:   No results for input(s): CKTOTAL, CKMB, CKMBINDEX, TROPONINI in the last 168 hours. BNP (last 3 results) No results for input(s): BNP in the last 8760 hours.  ProBNP (last 3 results) No results for input(s): PROBNP in  the last 8760 hours.  CBG: No results for input(s): GLUCAP in the last 168 hours.  Recent Results (from the past 240 hour(s))  Culture, blood (routine x 2)     Status: None (Preliminary result)   Collection Time: 09/21/15 11:30 AM  Result Value Ref Range Status   Specimen Description BLOOD BLOOD LEFT FOREARM  Final   Special Requests BOTTLES DRAWN AEROBIC AND ANAEROBIC 10CC  Final   Culture   Final    NO GROWTH < 24 HOURS Performed at [facility]    Report Status PENDING  Incomplete  Culture, blood (routine x 2)     Status: None (Preliminary result)   Collection Time: 09/21/15 11:54 AM  Result Value Ref Range Status    Specimen Description BLOOD RIGHT ANTECUBITAL  Final   Special Requests BOTTLES DRAWN AEROBIC AND ANAEROBIC 10 CC  Final   Culture   Final    NO GROWTH < 24 HOURS Performed at [facility]    Report Status PENDING  Incomplete     Studies: No results found.  Scheduled Meds: . feeding supplement (ENSURE ENLIVE)  237 mL Oral BID BM  . levofloxacin (LEVAQUIN) IV  750 mg Intravenous Q24H  . metronidazole  500 mg Intravenous Q8H    Continuous Infusions: . sodium chloride 125 mL/hr at 09/22/15 [id number]     Time spent: 15 minutes   [facility] Pager [phone] . If 7PM-7AM, please contact night-coverage at [email or web address], password [facility] 09/22/2015, 3:55 PM  LOS: 1 day

## 2015-09-23 ENCOUNTER — Encounter (HOSPITAL_COMMUNITY)
Admission: EM | Disposition: A | Discharge status: Home / Self Care | Payer: Self-pay | Source: Home / Self Care | Attending: Internal Medicine

## 2015-09-23 ENCOUNTER — Encounter (HOSPITAL_COMMUNITY): Payer: Self-pay

## 2015-09-23 ENCOUNTER — Inpatient Hospital Stay (HOSPITAL_COMMUNITY): Payer: 59

## 2015-09-23 ENCOUNTER — Inpatient Hospital Stay (HOSPITAL_COMMUNITY): Payer: 59 | Admitting: Anesthesiology

## 2015-09-23 HISTORY — PX: ERCP: SHX5425

## 2015-09-23 HISTORY — PX: BILIARY STENT PLACEMENT: SHX5538

## 2015-09-23 LAB — COMPREHENSIVE METABOLIC PANEL
ALBUMIN: 3.2 g/dL — AB (ref 3.5–5.0)
ALT: 87 U/L — ABNORMAL HIGH (ref 17–63)
ANION GAP: 5 (ref 5–15)
AST: 71 U/L — AB (ref 15–41)
Alkaline Phosphatase: 475 U/L — ABNORMAL HIGH (ref 38–126)
BUN: 6 mg/dL (ref 6–20)
CHLORIDE: 109 mmol/L (ref 101–111)
CO2: 30 mmol/L (ref 22–32)
Calcium: 8.5 mg/dL — ABNORMAL LOW (ref 8.9–10.3)
Creatinine, Ser: 0.65 mg/dL (ref 0.61–1.24)
GFR calc Af Amer: >60 mL/min (ref >60)
GFR calc non Af Amer: >60 mL/min (ref >60)
GLUCOSE: 119 mg/dL — AB (ref 65–99)
POTASSIUM: 3.5 mmol/L (ref 3.5–5.1)
Sodium: 144 mmol/L (ref 135–145)
Total Bilirubin: 0.5 mg/dL (ref 0.3–1.2)
Total Protein: 5.4 g/dL — ABNORMAL LOW (ref 6.5–8.1)

## 2015-09-23 LAB — CBC
HCT: 35.8 % — ABNORMAL LOW (ref 39.0–52.0)
Hemoglobin: 12.1 g/dL — ABNORMAL LOW (ref 13.0–17.0)
MCH: 30.8 pg (ref 26.0–34.0)
MCHC: 33.8 g/dL (ref 30.0–36.0)
MCV: 91.1 fL (ref 78.0–100.0)
PLATELETS: 117 10*3/uL — AB (ref 150–400)
RBC: 3.93 MIL/uL — ABNORMAL LOW (ref 4.22–5.81)
RDW: 12.8 % (ref 11.5–15.5)
WBC: 3.2 10*3/uL — AB (ref 4.0–10.5)

## 2015-09-23 LAB — CANCER ANTIGEN 19-9: CA 19 9: 241 U/mL — AB (ref 0–35)

## 2015-09-23 SURGERY — ERCP, WITH INTERVENTION IF INDICATED
Anesthesia: General

## 2015-09-23 MED ORDER — LACTATED RINGERS IV SOLN
INTRAVENOUS | Status: DC | PRN
  Administered 2015-09-23: 13:00:00 via INTRAVENOUS

## 2015-09-23 MED ORDER — ONDANSETRON HCL 4 MG/2ML IJ SOLN
4.0000 mg | Freq: Four times a day (QID) | INTRAMUSCULAR | Status: AC | PRN
  Administered 2015-09-23: 4 mg via INTRAVENOUS

## 2015-09-23 MED ORDER — GLYCOPYRROLATE 0.2 MG/ML IJ SOLN
INTRAMUSCULAR | Status: DC | PRN
  Administered 2015-09-23: 0.2 mg via INTRAVENOUS

## 2015-09-23 MED ORDER — PHENYLEPHRINE HCL 10 MG/ML IJ SOLN
INTRAMUSCULAR | Status: DC | PRN
  Administered 2015-09-23 (×5): 80 ug via INTRAVENOUS

## 2015-09-23 MED ORDER — LIDOCAINE HCL (CARDIAC) 20 MG/ML IV SOLN
INTRAVENOUS | Status: DC | PRN
  Administered 2015-09-23: 50 mg via INTRAVENOUS

## 2015-09-23 MED ORDER — DOCUSATE SODIUM 100 MG PO CAPS
200.0000 mg | ORAL_CAPSULE | Freq: Once | ORAL | Status: AC
  Administered 2015-09-23: 200 mg via ORAL
  Filled 2015-09-23: qty 2

## 2015-09-23 MED ORDER — INDOMETHACIN 50 MG RE SUPP
100.0000 mg | Freq: Once | RECTAL | Status: AC
  Administered 2015-09-23: 100 mg via RECTAL

## 2015-09-23 MED ORDER — OXYCODONE HCL 5 MG PO TABS: 5.0000 mg | ORAL_TABLET | Freq: Once | ORAL | Status: DC | PRN

## 2015-09-23 MED ORDER — EPHEDRINE SULFATE 50 MG/ML IJ SOLN
INTRAMUSCULAR | Status: DC | PRN
  Administered 2015-09-23: 10 mg via INTRAVENOUS
  Administered 2015-09-23: 5 mg via INTRAVENOUS

## 2015-09-23 MED ORDER — SUCCINYLCHOLINE CHLORIDE 20 MG/ML IJ SOLN
INTRAMUSCULAR | Status: DC | PRN
  Administered 2015-09-23: 100 mg via INTRAVENOUS

## 2015-09-23 MED ORDER — FENTANYL CITRATE (PF) 100 MCG/2ML IJ SOLN
INTRAMUSCULAR | Status: AC
  Filled 2015-09-23: qty 2

## 2015-09-23 MED ORDER — DEXAMETHASONE SODIUM PHOSPHATE 10 MG/ML IJ SOLN
INTRAMUSCULAR | Status: DC | PRN
  Administered 2015-09-23: 10 mg via INTRAVENOUS

## 2015-09-23 MED ORDER — OXYCODONE HCL 5 MG/5ML PO SOLN: 5.0000 mg | Freq: Once | ORAL | Status: DC | PRN

## 2015-09-23 MED ORDER — FENTANYL CITRATE (PF) 100 MCG/2ML IJ SOLN
25.0000 ug | INTRAMUSCULAR | Status: DC | PRN
  Administered 2015-09-23: 100 ug via INTRAVENOUS

## 2015-09-23 MED ORDER — PROPOFOL 10 MG/ML IV BOLUS
INTRAVENOUS | Status: DC | PRN
  Administered 2015-09-23: 170 mg via INTRAVENOUS

## 2015-09-23 MED ORDER — ONDANSETRON HCL 4 MG/2ML IJ SOLN
INTRAMUSCULAR | Status: AC
  Filled 2015-09-23: qty 2

## 2015-09-23 MED ORDER — SUGAMMADEX SODIUM 200 MG/2ML IV SOLN
INTRAVENOUS | Status: AC
  Filled 2015-09-23: qty 2

## 2015-09-23 MED ORDER — SODIUM CHLORIDE 0.9 % IV SOLN
INTRAVENOUS | Status: DC | PRN
  Administered 2015-09-23: 20 mL

## 2015-09-23 MED ORDER — PHENYLEPHRINE 40 MCG/ML (10ML) SYRINGE FOR IV PUSH (FOR BLOOD PRESSURE SUPPORT)
PREFILLED_SYRINGE | INTRAVENOUS | Status: AC
  Filled 2015-09-23: qty 20

## 2015-09-23 MED ORDER — GLUCAGON HCL RDNA (DIAGNOSTIC) 1 MG IJ SOLR
INTRAMUSCULAR | Status: AC
  Filled 2015-09-23: qty 1

## 2015-09-23 MED ORDER — SUGAMMADEX SODIUM 200 MG/2ML IV SOLN
INTRAVENOUS | Status: DC | PRN
  Administered 2015-09-23: 200 mg via INTRAVENOUS

## 2015-09-23 MED ORDER — INDOMETHACIN 50 MG RE SUPP
RECTAL | Status: AC
  Filled 2015-09-23: qty 2

## 2015-09-23 MED ORDER — ROCURONIUM BROMIDE 100 MG/10ML IV SOLN
INTRAVENOUS | Status: DC | PRN
  Administered 2015-09-23: 25 mg via INTRAVENOUS

## 2015-09-23 NOTE — Anesthesia Preprocedure Evaluation (Signed)
Anesthesia Evaluation  Patient identified by MRN, date of birth, ID band Patient awake    Reviewed: Allergy & Precautions, NPO status , Patient's Chart, lab work & pertinent test results  Airway Mallampati: II   Neck ROM: full    Dental   Pulmonary  Interstitial fibrosis   breath sounds clear to auscultation       Cardiovascular negative cardio ROS   Rhythm:regular Rate:Normal     Neuro/Psych    GI/Hepatic   Endo/Other    Renal/GU      Musculoskeletal  (+) Arthritis ,   Abdominal   Peds  Hematology   Anesthesia Other Findings   Reproductive/Obstetrics                             Anesthesia Physical Anesthesia Plan  ASA: II  Anesthesia Plan: General   Post-op Pain Management:    Induction: Intravenous  Airway Management Planned: Oral ETT  Additional Equipment:   Intra-op Plan:   Post-operative Plan: Extubation in OR  Informed Consent: I have reviewed the patients History and Physical, chart, labs and discussed the procedure including the risks, benefits and alternatives for the proposed anesthesia with the patient or authorized representative who has indicated his/her understanding and acceptance.     Plan Discussed with: CRNA, Anesthesiologist and Surgeon  Anesthesia Plan Comments:         Anesthesia Quick Evaluation

## 2015-09-23 NOTE — Progress Notes (Signed)
Pt c/o dysuria/problem starting stream to GI MD on rounds. Upon assessment pt stated that he was soon after able to void successfully and a large amt. Informed pt that we would continue to closely monitor for any urinary retention and bladder scan as needed and also if needed placement of foley catheter. Will inform primary medical MD as well.

## 2015-09-23 NOTE — Op Note (Signed)
[facility] [place], [id number]   ERCP PROCEDURE REPORT  PATIENT: Roy English, Roy English  MR#: [id number] BIRTHDATE: Feb 15, 1951  GENDER: male ENDOSCOPIST: Milus Banister, MD PROCEDURE DATE:  09/23/2015 PROCEDURE:   ERCP with stent removal, and ERCP with stent placement   INDICATIONS:09/08/2015 ERCP Dr.  Carlean Purl for obstructing pancreatic head mass; small sphincterotomy, brushing of stricture, stenting with plastic 10Fr, 5cm long; brushings were non-diagnostic; 09/17/2015 EUS/FNA at [facility] (cytology "poorly differentiated carcinoma"); presented 2/19 with abd pain, fever, rise of liver tests, CT scan ? migration of biliary stent.  MEDICATIONS: Per Anesthesia, levaquin/flagyl in hospital TOPICAL ANESTHETIC:   none  DESCRIPTION OF PROCEDURE:     Physical exam was performed.  Informed consent was obtained from the patient after explaining the benefits, risks, and alternatives to the procedure.  The patient was connected to the monitor and placed in the semi-prone position. IV medicine was administered through an indwelling cannula and oxygen via endotracheal tube.  After administration of sedation, the patients esophagus was intubated and the Pentax Ercp Scope [phone]  endoscope was advanced under direct visualization to the second portion of the duodenum without detailed examination of the UGI tract. The previously placed plastic biliary stent was extending into the duodenum by 2-3cm.  This was removed with a snare and then the bile duct was cannulated with a 44 Autotome over a .035 hydrawire and contrast was injected. Cholangiogram revealed 1.5cm distal CBD stricutre and upstream dilation of the extrhepatic biliary tree. The cystic duct did not opacify.  A 6cm long 74mm diameter fully covered (removable ) metal mesh stent was then placed across the biliary stricture in good position.  The most distal 1cm to 1.5cm of the stent extended into the duodenum following  placement. There was no dark bile or purulence but the contrast drained very through the stent following it's placement. The scope was then completely withdrawn from the patient and the procedure terminated.    COMPLICATIONS:    None  ENDOSCOPIC IMPRESSION: Previously placed 5cm long plastic biliary stent was extending 2-3cm into the duodenum (likely migrated out of position). This was removed with a snare and then the malignant distal CBD stricture was re-stented with a 6cm long 31mm diameter fully covered (removable) metal mesh stent.  RECOMMENDATIONS: If stent migration was causing his symptoms (fever, rise in lfts, pain), then this procedure should help.  Some of his pain MAY be from underlying tumor or post EUS FNA related swelling, pain (which is not very common).  Of note, my reading of his admitting CT scan seems to show the head of pancreas mass directly abuts the SMV (just proximal to the splenic vein confluence) with loss of clear tissue plane at this location. This suggests possible SMV invasion.    _______________________________ eSignedMilus Banister, MD 09/23/2015 2:04 PM

## 2015-09-23 NOTE — Progress Notes (Signed)
PROGRESS NOTE  Roy English [id number] DOB: November 23, 1950 DOA: 09/21/2015 PCP: Golden Pop, MD  HPI/Recap of past 73 hours: 65 year old male with past medical history of recently discovered pancreatic mass who underwent an ERCP several weeks ago with stent placement and then follow-up at [facility] for biopsy admitted on 2/19 after waking up that morning with fever and some right upper quadrant pain along with pain. Patient found to have transaminitis on admission and after GI evaluation suspected migration of stent.  Patient underwent ERCP on 2/21.  GI found previous stent had migrated out of position and was removed with snare.  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 GI felt during this procedure that the pancreatic head mass may be invading the superior mesenteric vein. Patient seen post procedure and tired, complaining of some pain and mild nausea.  Assessment/Plan: Principal Problem:   Acute abdominal pain with secondary transaminitis felt to be secondary to biliary stent migration: Status post new stent placement. 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     Hypokalemia: Secondary to nausea and vomiting, replaced    Code Status: Full code   Family Communication: Wife  at the bedside   Disposition Plan: Likely tomorrow. Slowly advance diet    Consultants:  Gastroenterology   Procedures:  ERCP on 2/21:  previous stent extracted and new stent placed at distal common bile duct stricture   Antibiotics:  IV Levaquin 2/19-present  IV Flagyl 2/19 -present    Objective: BP 110/65 mmHg  Pulse 46  Temp(Src) 98.4 F (36.9 C) (Oral)  Resp 16  Ht 5\' 9"  (1.753 m)  Wt 69.4 kg (153 lb)  BMI 22.58 kg/m2  SpO2 97%  Intake/Output Summary (Last 24 hours) at  09/23/15 1528 Last data filed at 09/23/15 1350  Gross per 24 hour  Intake    700 ml  Output      0 ml  Net    700 ml   Filed Weights   09/21/15 1157  Weight: 69.4 kg (153 lb)    Exam:   General:  Alert and oriented 3, fatigued   cardiovascular: Regular rate and rhythm, S1-S2   Respiratory: Clear to auscultation bilaterally   Abdomen: Soft, Generalized nonspecific pain, nondistended, hypoactive bowel sounds   Musculoskeletal: Trace pitting edema    Data Reviewed: Basic Metabolic Panel:  Recent Labs Lab 09/21/15 1140 09/21/15 1812 09/22/15 0350 09/23/15 0349  NA 135 137 138 144  K 3.2* 3.7 3.2* 3.5  CL 102 104 105 109  CO2 22 26 26 30   GLUCOSE 91 132* 110* 119*  BUN 14 8 7 6   CREATININE 0.73 0.75 0.58* 0.65  CALCIUM 8.8* 8.2* 8.3* 8.5*   Liver Function Tests:  Recent Labs Lab 09/21/15 1140 09/21/15 1812 09/22/15 0350 09/23/15 0349  AST 159* 100* 69* 71*  ALT 148* 110* 90* 87*  ALKPHOS 553* 459* 405* 475*  BILITOT 1.8* 1.4* 1.0 0.5  PROT 7.0 5.9* 5.7* 5.4*  ALBUMIN 4.4 3.6 3.4* 3.2*    Recent Labs Lab 09/21/15 1140  LIPASE 51   No results for input(s): AMMONIA in the last 168 hours. CBC:  Recent Labs Lab 09/21/15 1140 09/21/15 1812 09/22/15 0350 09/23/15 0349  WBC 10.0 6.8 3.9* 3.2*  NEUTROABS  --  6.3  --   --   HGB 14.1 12.5*  12.3* 12.1*  HCT 40.5 36.5* 35.9* 35.8*  MCV 87.7 88.4 90.0 91.1  PLT 153 114* 119* 117*   Cardiac Enzymes:   No results for input(s): CKTOTAL, CKMB, CKMBINDEX, TROPONINI in the last 168 hours. BNP (last 3 results) No results for input(s): BNP in the last 8760 hours.  ProBNP (last 3 results) No results for input(s): PROBNP in the last 8760 hours.  CBG: No results for input(s): GLUCAP in the last 168 hours.  Recent Results (from the past 240 hour(s))  Culture, blood (routine x 2)     Status: None (Preliminary result)   Collection Time: 09/21/15 11:30 AM  Result Value Ref Range Status   Specimen  Description BLOOD BLOOD LEFT FOREARM  Final   Special Requests BOTTLES DRAWN AEROBIC AND ANAEROBIC 10CC  Final   Culture   Final    NO GROWTH 2 DAYS Performed at [facility]    Report Status PENDING  Incomplete  Culture, blood (routine x 2)     Status: None (Preliminary result)   Collection Time: 09/21/15 11:54 AM  Result Value Ref Range Status   Specimen Description BLOOD RIGHT ANTECUBITAL  Final   Special Requests BOTTLES DRAWN AEROBIC AND ANAEROBIC 10 CC  Final   Culture   Final    NO GROWTH 2 DAYS Performed at [facility]    Report Status PENDING  Incomplete     Studies: Dg Ercp  09/23/2015  CLINICAL DATA:  ERCP for biliary obstruction EXAM: ERCP TECHNIQUE: Multiple spot images obtained with the fluoroscopic device and submitted for interpretation post-procedure. COMPARISON:  CT abdomen pelvis - 09/21/2015 FINDINGS: 4 spot intraoperative fluoroscopic images of the right upper abdominal quadrant during ERCP are provided for review. Initial image demonstrates an ERCP probe overlying the right upper abdominal quadrant. Subsequent images demonstrate selective cannulation and opacification of the common bile duct which appears at least moderately dilated. There is minimal opacification of the central aspect of the biliary tree which appears also moderately dilated. There is no definitive opacification of the cystic or pancreatic ducts. Completion images demonstrate apparent placement of a biliary stent overlying expected location the distal aspect of the CBD. IMPRESSION: ERCP with placement of an internal biliary stent as above. These images were submitted for radiologic interpretation only. Please see the procedural report for the amount of contrast and the fluoroscopy time utilized. Electronically Signed   By: Sandi Mariscal M.D.   On: 09/23/2015 14:17    Scheduled Meds: . feeding supplement (ENSURE ENLIVE)  237 mL Oral BID BM  . levofloxacin (LEVAQUIN) IV  750 mg Intravenous  Q24H  . metronidazole  500 mg Intravenous Q8H    Continuous Infusions: . sodium chloride 125 mL/hr at 09/22/15 2041     Time spent: 10 minutes   [place] [facility] Pager [phone] . If 7PM-7AM, please contact night-coverage at [email or web address], password [facility] 09/23/2015, 3:28 PM  LOS: 2 days

## 2015-09-23 NOTE — Progress Notes (Signed)
Pt alert and oriented. Wife at bedside. No c/o pain at present. VSS. Will cont to monitor.

## 2015-09-23 NOTE — Transfer of Care (Signed)
Immediate Anesthesia Transfer of Care Note  Patient: Roy English  Procedure(s) Performed: Procedure(s) with comments: ENDOSCOPIC RETROGRADE CHOLANGIOPANCREATOGRAPHY (ERCP) (N/A) - with stent exchange BILIARY STENT PLACEMENT (N/A)  Patient Location: PACU  Anesthesia Type:General  Level of Consciousness:  sedated, patient cooperative and responds to stimulation  Airway & Oxygen Therapy:Patient Spontanous Breathing and Patient connected to face mask oxgen  Post-op Assessment:  Report given to PACU RN and Post -op Vital signs reviewed and stable  Post vital signs:  Reviewed and stable  Last Vitals:  Filed Vitals:   09/23/15 0513 09/23/15 1232  BP: 90/52 104/57  Pulse: 50 54  Temp: 36.4 C 36.7 C  Resp: 18 21    Complications: No apparent anesthesia complications

## 2015-09-23 NOTE — Progress Notes (Signed)
See full ERCP report in EPIC.   ENDOSCOPIC IMPRESSION: Previously placed 5cm long plastic biliary stent was extending 2-3cm into the duodenum (likely migrated out of position). This was removed with a snare and then the malignant distal CBD stricture was re-stented with a 6cm long 25mm diameter fully covered (removable) metal mesh stent.   RECOMMENDATIONS: If stent migration was causing his symptoms (fever, rise in lfts, pain), then this procedure should help.  Some of his pain MAY be from underlying tumor or post EUS FNA related swelling, pain (which is not very common).  Of note, my reading of his admitting CT scan seems to show the head of pancreas mass directly abuts the SMV (just proximal to the splenic vein confluence) with loss of clear tissue plane at this location. This suggests possible SMV invasion.    His [facility] surgical team should get a copy of his admitting CT on disc.  I think the tumor is involving/invading his SMV on my review and this may impact their plans.

## 2015-09-23 NOTE — Interval H&P Note (Signed)
History and Physical Interval Note:  09/23/2015 12:29 PM  Roy English  has presented today for surgery, with the diagnosis of cholangitis  The various methods of treatment have been discussed with the patient and family. After consideration of risks, benefits and other options for treatment, the patient has consented to  Procedure(s) with comments: ENDOSCOPIC RETROGRADE CHOLANGIOPANCREATOGRAPHY (ERCP) (N/A) - with stent exchange BILIARY STENT PLACEMENT (N/A) as a surgical intervention .  The patient's history has been reviewed, patient examined, no change in status, stable for surgery.  I have reviewed the patient's chart and labs.  Questions were answered to the patient's satisfaction.     Milus Banister

## 2015-09-23 NOTE — Anesthesia Procedure Notes (Signed)
Procedure Name: Intubation Date/Time: 09/23/2015 1:15 PM Performed by: Sandar Krinke, Virgel Gess Pre-anesthesia Checklist: Patient identified, Emergency Drugs available, Suction available, Patient being monitored and Timeout performed Patient Re-evaluated:Patient Re-evaluated prior to inductionOxygen Delivery Method: Circle system utilized Preoxygenation: Pre-oxygenation with 100% oxygen Intubation Type: IV induction Ventilation: Mask ventilation without difficulty Laryngoscope Size: Mac and 4 Grade View: Grade II Tube type: Oral Tube size: 7.5 mm Number of attempts: 1 Airway Equipment and Method: Stylet Placement Confirmation: ETT inserted through vocal cords under direct vision,  positive ETCO2,  CO2 detector and breath sounds checked- equal and bilateral Secured at: 22 cm Tube secured with: Tape Dental Injury: Teeth and Oropharynx as per pre-operative assessment

## 2015-09-23 NOTE — H&P (View-Only) (Signed)
Progress Note   Subjective  Last 24 hours. No fevers, however patient endorses some night sweats and continues to have ongoing pain. LAEs relatively stable compared to yesterday.    Objective   Vital signs in last 24 hours: Temp:  [97.6 F (36.4 C)-98.2 F (36.8 C)] 97.6 F (36.4 C) (02/21 0513) Pulse Rate:  [50-58] 50 (02/21 0513) Resp:  [18] 18 (02/21 0513) BP: (90-104)/(52-60) 90/52 mmHg (02/21 0513) SpO2:  [96 %-98 %] 96 % (02/21 0513) Last BM Date: 09/22/15 General:    white male in NAD Heart:  Regular rate and rhythm; no murmurs Lungs: Respirations even and unlabored, lungs CTA bilaterally Abdomen:  Soft, mild epigastric to RUQ TTP without rebound or guard and nondistended. Normal bowel sounds. Extremities:  Without edema. Neurologic:  Alert and oriented,  grossly normal neurologically. Psych:  Cooperative. Normal mood and affect.  Intake/Output from previous day: 02/20 0701 - 02/21 0700 In: 747.5 [P.O.:480; I.V.:267.5] Out: 1925 [Urine:1925] Intake/Output this shift:    Lab Results:  Recent Labs  09/21/15 1812 09/22/15 0350 09/23/15 0349  WBC 6.8 3.9* 3.2*  HGB 12.5* 12.3* 12.1*  HCT 36.5* 35.9* 35.8*  PLT 114* 119* 117*   BMET  Recent Labs  09/21/15 1812 09/22/15 0350 09/23/15 0349  NA 137 138 144  K 3.7 3.2* 3.5  CL 104 105 109  CO2 26 26 30   GLUCOSE 132* 110* 119*  BUN 8 7 6   CREATININE 0.75 0.58* 0.65  CALCIUM 8.2* 8.3* 8.5*   LFT  Recent Labs  09/23/15 0349  PROT 5.4*  ALBUMIN 3.2*  AST 71*  ALT 87*  ALKPHOS 475*  BILITOT 0.5   PT/INR  Recent Labs  09/21/15 1812  LABPROT 16.8*  INR 1.35    Studies/Results: Ct Abdomen Pelvis W Contrast  09/21/2015  CLINICAL DATA:  biliary stent placement about two weeks ago; and underwent pancreatic biopsy Wed. (4 days ago). He is here today d/t abd. Pain and fever today. EXAM: CT ABDOMEN AND PELVIS WITH CONTRAST TECHNIQUE: Multidetector CT imaging of the abdomen and pelvis was  performed using the standard protocol following bolus administration of intravenous contrast. CONTRAST:  130mL OMNIPAQUE IOHEXOL 300 MG/ML SOLN, 75mL OMNIPAQUE IOHEXOL 300 MG/ML SOLN COMPARISON:  MRI, 09/06/2015 FINDINGS: Lung bases: Heterogeneous areas of lung base interstitial fibrosis. No evidence of pneumonia or edema. Heart normal in size. Hepatobiliary: No liver mass or lesion. There is intra and extrahepatic bile duct dilation despite the presence of a short biliary stent. Stent extends from the mid to distal duct into the third portion of the duodenum. Gallbladder is distended but otherwise unremarkable. Pancreas: 2.9 cm hypo attenuating pancreatic head mass unchanged from the prior MRI. Mild dilation of pancreatic duct, also stable. No pancreatic inflammation. Spleen: Unremarkable. Adrenal glands, kidneys, ureters, bladder: No adrenal masses. 14 mm posterior right midpole renal mass. This appears to be a solid enhancing mass. Small nonobstructing stone in the midpole the left kidney. No other renal abnormality. Normal ureters. Bladder is unremarkable. Lymph nodes:  No adenopathy. Ascites:  None. Gastrointestinal: Stomach and small bowel are unremarkable. Mild increased stool in the colon. Colon otherwise unremarkable. Normal appendix visualized. Musculoskeletal: Hemangioma in the T12 vertebrae with slight depression of the upper endplate. Mild degenerative changes most evident at L5-S1. No osteoblastic or osteolytic lesions. IMPRESSION: 1. Despite the presence of the biliary stent, there is intra and extrahepatic bile duct dilation. This suggests stent malfunction. The biliary stent extends beyond the ampulla of  Vater, and may have migrated distally since its insertion. Tip of the stent projects in the third portion of the duodenum. 2. 14 mm enhancing posterior midpole right renal mass. This is suspicious for small renal cell carcinoma. Electronically Signed   By: Lajean Manes M.D.   On: 09/21/2015 14:32     Dg Chest Portable 1 View  09/21/2015  CLINICAL DATA:  Woke up with new pain and fever. 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 Awaiting needle biopsy from tumor, due back tomorrow. Was told to watch out for fever and increased pain d/t risk of pancreatitis, states his urine is also getting darker.Nonsmoker. EXAM: PORTABLE CHEST 1 VIEW COMPARISON:  09/25/2012 FINDINGS: Cardiac silhouette is normal in size. No mediastinal or hilar masses or convincing adenopathy. Mild chronic scarring in the lung bases. Lungs otherwise clear with no evidence of pneumonia or edema. No pleural effusion or pneumothorax. There are old healed rib fractures on the right. IMPRESSION: No acute cardiopulmonary disease. Electronically Signed   By: Lajean Manes M.D.   On: 09/21/2015 12:28       Assessment / Plan:   65 y/o male with pancreatic mass concerning for malignancy, causing biliary obstruction. Initially had CBD stent placed on Feb 6th with brushings obtained which were nondiagnostic. EUS done with biopsies on 2/15, pathology pending, but mass appeared resectable. CA 19-9 markedly elevated. He represented with fevers and pain with rise in LAEs concerning for stent obstruction / migration. He is on antibiotics and no further leukocytosis or fevers but pain persists and LAEs relatively unchanged. He is scheduled for ERCP today with Dr. Ardis Hughs for stent change, further recommendations pending this result. I have discussed the risks / benefits of ERCP and he wishes to proceed. He will go back to [facility] for his surgical care once his acute issues have been addressed. Otherwise of note, he has some urinary retention today but Creatinine normal, would consider bladder scan but defer to primary service.   Gillette Cellar, MD [facility] Pager [phone]   Principal Problem:   Acute abdominal pain Active Problems:   Interstitial lung disease ([place])   Elevated  liver enzymes   Common bile duct stricture   Pancreatic mass   Hypokalemia   Biliary stent migration     LOS: 2 days   Renelda Loma Armbruster  09/23/2015, 10:58 AM

## 2015-09-23 NOTE — Progress Notes (Signed)
Progress Note   Subjective  Last 24 hours. No fevers, however patient endorses some night sweats and continues to have ongoing pain. LAEs relatively stable compared to yesterday.    Objective   Vital signs in last 24 hours: Temp:  [97.6 F (36.4 C)-98.2 F (36.8 C)] 97.6 F (36.4 C) (02/21 0513) Pulse Rate:  [50-58] 50 (02/21 0513) Resp:  [18] 18 (02/21 0513) BP: (90-104)/(52-60) 90/52 mmHg (02/21 0513) SpO2:  [96 %-98 %] 96 % (02/21 0513) Last BM Date: 09/22/15 General:    white male in NAD Heart:  Regular rate and rhythm; no murmurs Lungs: Respirations even and unlabored, lungs CTA bilaterally Abdomen:  Soft, mild epigastric to RUQ TTP without rebound or guard and nondistended. Normal bowel sounds. Extremities:  Without edema. Neurologic:  Alert and oriented,  grossly normal neurologically. Psych:  Cooperative. Normal mood and affect.  Intake/Output from previous day: 02/20 0701 - 02/21 0700 In: 747.5 [P.O.:480; I.V.:267.5] Out: 1925 [Urine:1925] Intake/Output this shift:    Lab Results:  Recent Labs  09/21/15 1812 09/22/15 0350 09/23/15 0349  WBC 6.8 3.9* 3.2*  HGB 12.5* 12.3* 12.1*  HCT 36.5* 35.9* 35.8*  PLT 114* 119* 117*   BMET  Recent Labs  09/21/15 1812 09/22/15 0350 09/23/15 0349  NA 137 138 144  K 3.7 3.2* 3.5  CL 104 105 109  CO2 26 26 30   GLUCOSE 132* 110* 119*  BUN 8 7 6   CREATININE 0.75 0.58* 0.65  CALCIUM 8.2* 8.3* 8.5*   LFT  Recent Labs  09/23/15 0349  PROT 5.4*  ALBUMIN 3.2*  AST 71*  ALT 87*  ALKPHOS 475*  BILITOT 0.5   PT/INR  Recent Labs  09/21/15 1812  LABPROT 16.8*  INR 1.35    Studies/Results: Ct Abdomen Pelvis W Contrast  09/21/2015  CLINICAL DATA:  biliary stent placement about two weeks ago; and underwent pancreatic biopsy Wed. (4 days ago). He is here today d/t abd. Pain and fever today. EXAM: CT ABDOMEN AND PELVIS WITH CONTRAST TECHNIQUE: Multidetector CT imaging of the abdomen and pelvis was  performed using the standard protocol following bolus administration of intravenous contrast. CONTRAST:  142mL OMNIPAQUE IOHEXOL 300 MG/ML SOLN, 29mL OMNIPAQUE IOHEXOL 300 MG/ML SOLN COMPARISON:  MRI, 09/06/2015 FINDINGS: Lung bases: Heterogeneous areas of lung base interstitial fibrosis. No evidence of pneumonia or edema. Heart normal in size. Hepatobiliary: No liver mass or lesion. There is intra and extrahepatic bile duct dilation despite the presence of a short biliary stent. Stent extends from the mid to distal duct into the third portion of the duodenum. Gallbladder is distended but otherwise unremarkable. Pancreas: 2.9 cm hypo attenuating pancreatic head mass unchanged from the prior MRI. Mild dilation of pancreatic duct, also stable. No pancreatic inflammation. Spleen: Unremarkable. Adrenal glands, kidneys, ureters, bladder: No adrenal masses. 14 mm posterior right midpole renal mass. This appears to be a solid enhancing mass. Small nonobstructing stone in the midpole the left kidney. No other renal abnormality. Normal ureters. Bladder is unremarkable. Lymph nodes:  No adenopathy. Ascites:  None. Gastrointestinal: Stomach and small bowel are unremarkable. Mild increased stool in the colon. Colon otherwise unremarkable. Normal appendix visualized. Musculoskeletal: Hemangioma in the T12 vertebrae with slight depression of the upper endplate. Mild degenerative changes most evident at L5-S1. No osteoblastic or osteolytic lesions. IMPRESSION: 1. Despite the presence of the biliary stent, there is intra and extrahepatic bile duct dilation. This suggests stent malfunction. The biliary stent extends beyond the ampulla of  Vater, and may have migrated distally since its insertion. Tip of the stent projects in the third portion of the duodenum. 2. 14 mm enhancing posterior midpole right renal mass. This is suspicious for small renal cell carcinoma. Electronically Signed   By: Lajean Manes M.D.   On: 09/21/2015 14:32     Dg Chest Portable 1 View  09/21/2015  CLINICAL DATA:  Woke up with new pain and fever. 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 Awaiting needle biopsy from tumor, due back tomorrow. Was told to watch out for fever and increased pain d/t risk of pancreatitis, states his urine is also getting darker.Nonsmoker. EXAM: PORTABLE CHEST 1 VIEW COMPARISON:  09/25/2012 FINDINGS: Cardiac silhouette is normal in size. No mediastinal or hilar masses or convincing adenopathy. Mild chronic scarring in the lung bases. Lungs otherwise clear with no evidence of pneumonia or edema. No pleural effusion or pneumothorax. There are old healed rib fractures on the right. IMPRESSION: No acute cardiopulmonary disease. Electronically Signed   By: Lajean Manes M.D.   On: 09/21/2015 12:28       Assessment / Plan:   65 y/o male with pancreatic mass concerning for malignancy, causing biliary obstruction. Initially had CBD stent placed on Feb 6th with brushings obtained which were nondiagnostic. EUS done with biopsies on 2/15, pathology pending, but mass appeared resectable. CA 19-9 markedly elevated. He represented with fevers and pain with rise in LAEs concerning for stent obstruction / migration. He is on antibiotics and no further leukocytosis or fevers but pain persists and LAEs relatively unchanged. He is scheduled for ERCP today with Dr. Ardis Hughs for stent change, further recommendations pending this result. I have discussed the risks / benefits of ERCP and he wishes to proceed. He will go back to [facility] for his surgical care once his acute issues have been addressed. Otherwise of note, he has some urinary retention today but Creatinine normal, would consider bladder scan but defer to primary service.   Butler Cellar, MD [facility]astroenter[facility] Pager [phone]   Principal Problem:   Acute abdominal pain Active Problems:   Interstitial lung disease ([place])   Elevated  liver enzymes   Common bile duct stricture   Pancreatic mass   Hypokalemia   Biliary stent migration     LOS: 2 days   Roy English  09/23/2015, 10:58 AM

## 2015-09-24 ENCOUNTER — Encounter (HOSPITAL_COMMUNITY): Payer: Self-pay | Admitting: Gastroenterology

## 2015-09-24 DIAGNOSIS — E876 Hypokalemia: Secondary | ICD-10-CM

## 2015-09-24 DIAGNOSIS — J849 Interstitial pulmonary disease, unspecified: Secondary | ICD-10-CM

## 2015-09-24 LAB — COMPREHENSIVE METABOLIC PANEL
ALT: 71 U/L — AB (ref 17–63)
AST: 36 U/L (ref 15–41)
Albumin: 3.3 g/dL — ABNORMAL LOW (ref 3.5–5.0)
Alkaline Phosphatase: 436 U/L — ABNORMAL HIGH (ref 38–126)
Anion gap: 7 (ref 5–15)
BILIRUBIN TOTAL: 0.8 mg/dL (ref 0.3–1.2)
BUN: 7 mg/dL (ref 6–20)
CO2: 27 mmol/L (ref 22–32)
CREATININE: 0.64 mg/dL (ref 0.61–1.24)
Calcium: 8.5 mg/dL — ABNORMAL LOW (ref 8.9–10.3)
Chloride: 106 mmol/L (ref 101–111)
Glucose, Bld: 170 mg/dL — ABNORMAL HIGH (ref 65–99)
POTASSIUM: 3.5 mmol/L (ref 3.5–5.1)
Sodium: 140 mmol/L (ref 135–145)
TOTAL PROTEIN: 5.4 g/dL — AB (ref 6.5–8.1)

## 2015-09-24 MED ORDER — BOOST PLUS PO LIQD: 237.0000 mL | Freq: Two times a day (BID) | ORAL | Status: DC

## 2015-09-24 MED ORDER — LEVOFLOXACIN 750 MG PO TABS: 750.0000 mg | ORAL_TABLET | Freq: Every day | ORAL | Status: DC

## 2015-09-24 MED ORDER — ONDANSETRON HCL 4 MG PO TABS: 4.0000 mg | ORAL_TABLET | Freq: Four times a day (QID) | ORAL | Status: DC | PRN

## 2015-09-24 MED ORDER — TRAMADOL HCL 50 MG PO TABS
50.0000 mg | ORAL_TABLET | Freq: Four times a day (QID) | ORAL | Status: DC | PRN
  Administered 2015-09-24: 100 mg via ORAL
  Filled 2015-09-24: qty 2

## 2015-09-24 MED ORDER — ENSURE ENLIVE PO LIQD: 237.0000 mL | Freq: Two times a day (BID) | ORAL | Status: DC

## 2015-09-24 MED ORDER — METRONIDAZOLE 500 MG PO TABS: 500.0000 mg | ORAL_TABLET | Freq: Three times a day (TID) | ORAL | Status: DC

## 2015-09-24 MED ORDER — TRAMADOL HCL 50 MG PO TABS: 50.0000 mg | ORAL_TABLET | Freq: Four times a day (QID) | ORAL | Status: DC | PRN

## 2015-09-24 NOTE — Discharge Summary (Signed)
Physician Discharge Summary  USBALDO Roy English [id number] DOB: May 14, 1951 DOA: 09/21/2015  PCP: Golden Pop, MD  Admit date: 09/21/2015 Discharge date: 09/24/2015  Time spent: 50 minutes    Discharge Condition: Stable    Discharge Diagnoses:  Principal Problem:   Biliary stent migration Active Problems:   Elevated liver enzymes   Common bile duct stricture   Pancreatic mass   Acute abdominal pain   Hypokalemia   Interstitial lung disease (HCC)   History of present illness:  65 year old male with history of recently diagnosed pancreatic mass who underwent ERCP and stenting on 09/08/15. He subsequently had a follow-up and a biopsy at [facility] and has further follow-up there. He was admitted on 2/19 with a complaint of fever 101 and right upper quadrant pain. He was found to have transaminitis and was admitted for reevaluation of his stent.  Hospital Course:  Right upper quadrant pain, transaminitis, fever -Suspected to be secondary to biliary stent migration- C CT abdomen and pelvis report below which reveals intra-and extrahepatic bile duct dilatation -He underwent an ERCP on 2/21 with stent removal and replacement with a longer stent-see report below -He has been on the Levaquin and Flagyl for his fever which may have been secondary to cholangitis-GI, Dr. Ardis Hughs, is recommending that these medications be continued for another 3 days -LFTs are trending down  Chronic interstitial lung disease -Has remained stable  Hypokalemia -Inadequately replaced  Procedures: ERCP 2/21 Previously placed 5cm long plastic biliary stent was extending 2-3cm into the duodenum (likely migrated out of position). This was removed with a snare and then the malignant distal CBD stricture was re-stented with a 6cm long 56mm diameter fully covered (removable) metal mesh stent.  RECOMMENDATIONS: If stent migration was causing his symptoms (fever, rise in lfts, pain), then this procedure  should help. Some of his pain MAY be from underlying tumor or post EUS FNA related swelling, pain (which is not very common). Of note, my reading of his admitting CT scan seems to show the head of pancreas mass directly abuts the SMV (just proximal to the splenic vein confluence) with loss of clear tissue plane at this location. This suggests possible SMV invasion.   Consultations:  GI  Discharge Exam: Filed Weights   09/21/15 1157 09/24/15 0640  Weight: 69.4 kg (153 lb) 72.8 kg (160 lb 7.9 oz)   Filed Vitals:   09/23/15 2121 09/24/15 0640  BP: 96/60 103/65  Pulse: 52 54  Temp: 97.4 F (36.3 C) 98 F (36.7 C)  Resp: 16 16    General: AAO x 3, no distress Cardiovascular: RRR, no murmurs  Respiratory: clear to auscultation bilaterally GI: soft, non-tender, non-distended, bowel sound positive  Discharge Instructions You were cared for by a hospitalist during your hospital stay. If you have any questions about your discharge medications or the care you received while you were in the hospital after you are discharged, you can call the unit and asked to speak with the hospitalist on call if the hospitalist that took care of you is not available. Once you are discharged, your primary care physician will handle any further medical issues. Please note that NO REFILLS for any discharge medications will be authorized once you are discharged, as it is imperative that you return to your primary care physician (or establish a relationship with a primary care physician if you do not have one) for your aftercare needs so that they can reassess your need for medications and monitor your lab values.  Discharge Instructions    Diet - low sodium heart healthy    Complete by:  As directed      Increase activity slowly    Complete by:  As directed             Medication List    TAKE these medications        ALEVE 220 MG tablet  Generic drug:  naproxen sodium  Take 440 mg by mouth  every morning.     cyclobenzaprine 10 MG tablet  Commonly known as:  FLEXERIL  Take 1 tablet (10 mg total) by mouth 3 (three) times daily as needed for muscle spasms.     GLUCOSAMINE CHONDR COMPLEX PO  Take 2 capsules by mouth daily.     lactose free nutrition Liqd  Take 237 mLs by mouth 2 (two) times daily between meals.     feeding supplement (ENSURE ENLIVE) Liqd  Take 237 mLs by mouth 2 (two) times daily between meals.     levofloxacin 750 MG tablet  Commonly known as:  LEVAQUIN  Take 1 tablet (750 mg total) by mouth daily.  Start taking on:  09/25/2015     metroNIDAZOLE 500 MG tablet  Commonly known as:  FLAGYL  Take 1 tablet (500 mg total) by mouth 3 (three) times daily.     ondansetron 4 MG tablet  Commonly known as:  ZOFRAN  Take 1 tablet (4 mg total) by mouth every 6 (six) hours as needed for nausea.     sildenafil 100 MG tablet  Commonly known as:  VIAGRA  Take 1 tablet (100 mg total) by mouth daily as needed for erectile dysfunction.     traMADol 50 MG tablet  Commonly known as:  ULTRAM  Take 1-2 tablets (50-100 mg total) by mouth every 6 (six) hours as needed. for pain       Allergies  Allergen Reactions  . Penicillins Other (See Comments)    Unknown reaction as a baby. Has patient had a PCN reaction causing immediate rash, facial/tongue/throat swelling, SOB or lightheadedness with hypotension: unknown Has patient had a PCN reaction causing severe rash involving mucus membranes or skin necrosis: uknown Has patient had a PCN reaction that required hospitalization: unknown Has patient had a PCN reaction occurring within the last 10 years: Yes If all of the above answers are "NO", then may proceed with Cephalosporin use.       The results of significant diagnostics from this hospitalization (including imaging, microbiology, ancillary and laboratory) are listed below for reference.    Significant Diagnostic Studies: Dg Eye Foreign Body  09/06/2015   CLINICAL DATA:  Metal working/exposure; clearance prior to MRI EXAM: ORBITS FOR FOREIGN BODY - 2 VIEW COMPARISON:  None. FINDINGS: There is no evidence of metallic foreign body within the orbits. No significant bone abnormality identified. IMPRESSION: No evidence of metallic foreign body within the orbits. Electronically Signed   By: Franchot Gallo M.D.   On: 09/06/2015 19:01   Ct Abdomen Pelvis W Contrast  09/21/2015  CLINICAL DATA:  biliary stent placement about two weeks ago; and underwent pancreatic biopsy Wed. (4 days ago). He is here today d/t abd. Pain and fever today. EXAM: CT ABDOMEN AND PELVIS WITH CONTRAST TECHNIQUE: Multidetector CT imaging of the abdomen and pelvis was performed using the standard protocol following bolus administration of intravenous contrast. CONTRAST:  140mL OMNIPAQUE IOHEXOL 300 MG/ML SOLN, 3mL OMNIPAQUE IOHEXOL 300 MG/ML SOLN COMPARISON:  MRI, 09/06/2015 FINDINGS: Lung bases: Heterogeneous areas  of lung base interstitial fibrosis. No evidence of pneumonia or edema. Heart normal in size. Hepatobiliary: No liver mass or lesion. There is intra and extrahepatic bile duct dilation despite the presence of a short biliary stent. Stent extends from the mid to distal duct into the third portion of the duodenum. Gallbladder is distended but otherwise unremarkable. Pancreas: 2.9 cm hypo attenuating pancreatic head mass unchanged from the prior MRI. Mild dilation of pancreatic duct, also stable. No pancreatic inflammation. Spleen: Unremarkable. Adrenal glands, kidneys, ureters, bladder: No adrenal masses. 14 mm posterior right midpole renal mass. This appears to be a solid enhancing mass. Small nonobstructing stone in the midpole the left kidney. No other renal abnormality. Normal ureters. Bladder is unremarkable. Lymph nodes:  No adenopathy. Ascites:  None. Gastrointestinal: Stomach and small bowel are unremarkable. Mild increased stool in the colon. Colon otherwise unremarkable. Normal  appendix visualized. Musculoskeletal: Hemangioma in the T12 vertebrae with slight depression of the upper endplate. Mild degenerative changes most evident at L5-S1. No osteoblastic or osteolytic lesions. IMPRESSION: 1. Despite the presence of the biliary stent, there is intra and extrahepatic bile duct dilation. This suggests stent malfunction. The biliary stent extends beyond the ampulla of Vater, and may have migrated distally since its insertion. Tip of the stent projects in the third portion of the duodenum. 2. 14 mm enhancing posterior midpole right renal mass. This is suspicious for small renal cell carcinoma. Electronically Signed   By: Lajean Manes M.D.   On: 09/21/2015 14:32   Dg Chest Portable 1 View  09/21/2015  CLINICAL DATA:  Woke up with new pain and fever. 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 Awaiting needle biopsy from tumor, due back tomorrow. Was told to watch out for fever and increased pain d/t risk of pancreatitis, states his urine is also getting darker.Nonsmoker. EXAM: PORTABLE CHEST 1 VIEW COMPARISON:  09/25/2012 FINDINGS: Cardiac silhouette is normal in size. No mediastinal or hilar masses or convincing adenopathy. Mild chronic scarring in the lung bases. Lungs otherwise clear with no evidence of pneumonia or edema. No pleural effusion or pneumothorax. There are old healed rib fractures on the right. IMPRESSION: No acute cardiopulmonary disease. Electronically Signed   By: Lajean Manes M.D.   On: 09/21/2015 12:28   Dg Ercp  09/23/2015  CLINICAL DATA:  ERCP for biliary obstruction EXAM: ERCP TECHNIQUE: Multiple spot images obtained with the fluoroscopic device and submitted for interpretation post-procedure. COMPARISON:  CT abdomen pelvis - 09/21/2015 FINDINGS: 4 spot intraoperative fluoroscopic images of the right upper abdominal quadrant during ERCP are provided for review. Initial image demonstrates an ERCP probe overlying the  right upper abdominal quadrant. Subsequent images demonstrate selective cannulation and opacification of the common bile duct which appears at least moderately dilated. There is minimal opacification of the central aspect of the biliary tree which appears also moderately dilated. There is no definitive opacification of the cystic or pancreatic ducts. Completion images demonstrate apparent placement of a biliary stent overlying expected location the distal aspect of the CBD. IMPRESSION: ERCP with placement of an internal biliary stent as above. These images were submitted for radiologic interpretation only. Please see the procedural report for the amount of contrast and the fluoroscopy time utilized. Electronically Signed   By: Sandi Mariscal M.D.   On: 09/23/2015 14:17   Dg Ercp Biliary & Pancreatic Ducts  09/08/2015  CLINICAL DATA:  Pancreatic mass and biliary obstruction. EXAM: ERCP TECHNIQUE: Multiple spot images  obtained with the fluoroscopic device and submitted for interpretation post-procedure. COMPARISON:  MRI/ MRCP examination on 09/06/2015 FINDINGS: Imaging during the ERCP procedure demonstrates cannulation of the common bile duct with contrast injection showing dilatation of the proximal common bile duct and a distal common duct stricture. An endoscopic stent was placed spanning across the mid to distal common bile duct. IMPRESSION: Distal common bile duct stricture with proximal biliary dilatation. An endoscopic stent was placed across the stricture. These images were submitted for radiologic interpretation only. Please see the procedural report for the amount of contrast and the fluoroscopy time utilized. Electronically Signed   By: Aletta Edouard M.D.   On: 09/08/2015 15:18   Mr Jeananne Rama W/wo Cm/mrcp  09/07/2015  ADDENDUM REPORT: 09/07/2015 15:41 ADDENDUM: The case discussed with the on-call physician Dr. Collene Mares. Findings conveyed toDr. Toma Aran 09/07/2015  at15:40. Electronically Signed   By: Suzy Bouchard  M.D.   On: 09/07/2015 15:41  09/07/2015  ADDENDUM REPORT: 09/07/2015 15:32 ADDENDUM: The on call gastroenterology physician was paged without success. These results will be called to the ordering clinician or representative by the Radiologist Assistant, and communication documented in the PACS or zVision Dashboard. Electronically Signed   By: Suzy Bouchard M.D.   On: 09/07/2015 15:32  09/07/2015  CLINICAL DATA:  Common bile duct dilatation on ultrasound. Chronic abdominal pain. Elevated bilirubin. Weight loss. EXAM: MRI ABDOMEN WITHOUT AND WITH CONTRAST (INCLUDING MRCP) TECHNIQUE: Multiplanar multisequence MR imaging of the abdomen was performed both before and after the administration of intravenous contrast. Heavily T2-weighted images of the biliary and pancreatic ducts were obtained, and three-dimensional MRCP images were rendered by post processing. CONTRAST:  15 mL MultiHance COMPARISON:  Ultrasound 09/03/2015 FINDINGS: Lower chest:  Lung bases are clear. Hepatobiliary: There is significant intra and extrahepatic biliary duct dilatation. The common hepatic duct measures 12 mm and the common bile duct measures 15 mm. There is abrupt narrowing of the distal common bile duct. There is no enhancing lesion within liver parenchyma. The gallbladder is moderately distended at 4.4 cm. No gallstones identified. Pancreas: In the head of the pancreas, there is a rounded lesion which has low signal intensity on the noncontrast T1 weighted imaging in relation to the normal high signal intensity of pancreatic parenchymal tissue. This lesion measures 2.5 x 2.8 cm (image 34, series 17). On the noncontrast series this lesion is clearly demarcated has a hypovascular lesion measuring 2.6 by 2.2 cm on image 35, series 20. There is essentially absent enhancement more delayed series. Lesion does not appear to restrict diffusion. Lesion does have some high T2 signal on image 19, series 4 There is mild dilatation of the pancreatic  duct which measures 3 mm through the body and tail. No pancreatic atrophy identified There is no peripancreatic or periportal lymphadenopathy. The celiac trunk and SMA are well removed from the pancreatic head lesion. The SMA and portal vein for the proximity of the mass. Spleen: Normal spleen. Adrenals/urinary tract: Adrenal glands are normal. Kidneys are normal. Stomach/Bowel: Stomach and limited of the small bowel is unremarkable Vascular/Lymphatic: Abdominal aortic normal caliber. No retroperitoneal periportal lymphadenopathy. Musculoskeletal: No aggressive osseous lesion. Rounded lesion in the the T12 vertebral body measuring 2.2 cm (image 10, series a has imaging characteristics most consists with benign hemangioma. IMPRESSION: 1. Rounded pancreatic head mass obstructs the common bile duct with intra and extrahepatic biliary duct dilatation. Lesion also mildly obstructs the pancreatic duct. 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 At the lesion does not restrict  diffusion and there is essentially no enhancement raises the possibility of the focal pancreatitis. Recommend ERCP/EUS for relief of the biliary obstruction and tissue sampling. 2. The superior mesenteric artery and celiac branches appeared clear of the mass. 3. No hepatic liver lesions.  No significant lymphadenopathy. 4. Distention of the gallbladder likely related to the biliary obstruction. Electronically Signed: By: Suzy Bouchard M.D. On: 09/07/2015 15:04   US Abdomen Limited Ruq  09/03/2015  CLINICAL DATA:  Upper abdominal pain. EXAM: US ABDOMEN LIMITED - RIGHT UPPER QUADRANT COMPARISON:  CT 07/28/2012. FINDINGS: Gallbladder: No gallstones or wall thickening visualized. No sonographic Murphy sign noted by sonographer. Common bile duct: Diameter: 16 mm Liver: There is echogenic consistent fatty infiltration and/or hepatocellular disease. No focal hepatic abnormality identified. IMPRESSION: 1. Gallbladder is prominent in  size. No gallstones. No gallbladder wall thickening. Negative Murphy sign. 2. Common bile duct is dilated to 16 mm. MRCP should be considered for further evaluation to evaluate for obstructing lesion . 3. Liver is slightly echogenic consistent fatty infiltration and/or hepatocellular disease. Electronically Signed   By: Marcello Moores  Register   On: 09/03/2015 09:29    Microbiology: Recent Results (from the past 240 hour(s))  Culture, blood (routine x 2)     Status: None (Preliminary result)   Collection Time: 09/21/15 11:30 AM  Result Value Ref Range Status   Specimen Description BLOOD BLOOD LEFT FOREARM  Final   Special Requests BOTTLES DRAWN AEROBIC AND ANAEROBIC 10CC  Final   Culture   Final    NO GROWTH 3 DAYS Performed at [facility]    Report Status PENDING  Incomplete  Culture, blood (routine x 2)     Status: None (Preliminary result)   Collection Time: 09/21/15 11:54 AM  Result Value Ref Range Status   Specimen Description BLOOD RIGHT ANTECUBITAL  Final   Special Requests BOTTLES DRAWN AEROBIC AND ANAEROBIC 10 CC  Final   Culture   Final    NO GROWTH 3 DAYS Performed at [facility]    Report Status PENDING  Incomplete     Labs: Basic Metabolic Panel:  Recent Labs Lab 09/21/15 1140 09/21/15 1812 09/22/15 0350 09/23/15 0349 09/24/15 0413  NA 135 137 138 144 140  K 3.2* 3.7 3.2* 3.5 3.5  CL 102 104 105 109 106  CO2 22 26 26 30 27   GLUCOSE 91 132* 110* 119* 170*  BUN 14 8 7 6 7   CREATININE 0.73 0.75 0.58* 0.65 0.64  CALCIUM 8.8* 8.2* 8.3* 8.5* 8.5*   Liver Function Tests:  Recent Labs Lab 09/21/15 1140 09/21/15 1812 09/22/15 0350 09/23/15 0349 09/24/15 0413  AST 159* 100* 69* 71* 36  ALT 148* 110* 90* 87* 71*  ALKPHOS 553* 459* 405* 475* 436*  BILITOT 1.8* 1.4* 1.0 0.5 0.8  PROT 7.0 5.9* 5.7* 5.4* 5.4*  ALBUMIN 4.4 3.6 3.4* 3.2* 3.3*    Recent Labs Lab 09/21/15 1140  LIPASE 51   No results for input(s): AMMONIA in the last 168  hours. CBC:  Recent Labs Lab 09/21/15 1140 09/21/15 1812 09/22/15 0350 09/23/15 0349  WBC 10.0 6.8 3.9* 3.2*  NEUTROABS  --  6.3  --   --   HGB 14.1 12.5* 12.3* 12.1*  HCT 40.5 36.5* 35.9* 35.8*  MCV 87.7 88.4 90.0 91.1  PLT 153 114* 119* 117*   Cardiac Enzymes: No results for input(s): CKTOTAL, CKMB, CKMBINDEX, TROPONINI in the last 168 hours. BNP: BNP (last 3 results) No results for input(s): BNP  in the last 8760 hours.  ProBNP (last 3 results) No results for input(s): PROBNP in the last 8760 hours.  CBG: No results for input(s): GLUCAP in the last 168 hours.     SignedDebbe Odea, MD [facility] 09/24/2015, 1:19 PM

## 2015-09-24 NOTE — Progress Notes (Signed)
Nursing Discharge Summary  Patient ID: SHOAIB BARGERON MRN: [id number] DOB/AGE: [birth year] 65 y.o.  Admit date: 09/21/2015 Discharge date: 09/24/2015  Discharged Condition: good  Disposition: 01-Home or Self Care    Prescriptions Given: Prescriptions called into pharmacy by Dr Wynelle Cleveland.  Prescription given for Ultram. Follow up appointments and medications discussed with patient, wife, and his sister.  Patient and family verbalized understanding with no further questions.  Means of Discharge: Patient taken downstairs via wheelchair to be discharged home via private vehicle.   Signed: Buel Ream 09/24/2015, 1:55 PM

## 2015-09-25 NOTE — Anesthesia Postprocedure Evaluation (Signed)
Anesthesia Post Note  Patient: Roy English  Procedure(s) Performed: Procedure(s) (LRB): ENDOSCOPIC RETROGRADE CHOLANGIOPANCREATOGRAPHY (ERCP) (N/A) BILIARY STENT PLACEMENT (N/A)  Patient location during evaluation: PACU Anesthesia Type: General Level of consciousness: awake and alert and patient cooperative Pain management: pain level controlled Vital Signs Assessment: post-procedure vital signs reviewed and stable Respiratory status: spontaneous breathing and respiratory function stable Cardiovascular status: stable Anesthetic complications: no    Last Vitals:  Filed Vitals:   09/23/15 2121 09/24/15 0640  BP: 96/60 103/65  Pulse: 52 54  Temp: 36.3 C 36.7 C  Resp: 16 16    Last Pain:  Filed Vitals:   09/24/15 1151  PainSc: [place]

## 2015-09-26 ENCOUNTER — Encounter: Payer: Self-pay | Admitting: Internal Medicine

## 2015-09-26 LAB — CULTURE, BLOOD (ROUTINE X 2)
Culture: NO GROWTH
Culture: NO GROWTH

## 2015-09-29 ENCOUNTER — Telehealth: Payer: Self-pay | Admitting: *Deleted

## 2015-09-29 ENCOUNTER — Telehealth: Payer: Self-pay | Admitting: Internal Medicine

## 2015-09-29 NOTE — Telephone Encounter (Signed)
Dr. Dossie Der and patient requesting appointments to arrange chemo and radiation Tx of pancreatic cancer prior to (possible) resection).  Patient saw Dr. Dossie Der today.  I will forward this to [name] - GI oncology navigator to get the process going.  Dr. Dossie Der said any ? He can be reached on cell   [phone]

## 2015-09-29 NOTE — Telephone Encounter (Signed)
Oncology Nurse Navigator Documentation  Oncology Nurse Navigator Flowsheets 09/29/2015  Navigator Location [facility]  Navigator Encounter Type Introductory phone call   Spoke with patient and provided new patient appointment for 10/03/15 at 0830 to be seen in [facility] by Dr. Benay Spice and Dr. Lisbeth Renshaw. Informed of location of [place], valet service, and registration process. Reminded to bring insurance cards and a current medication list, including supplements. Patient verbalizes understanding. Will have case presented in tumor board 10/01/15.

## 2015-10-01 ENCOUNTER — Encounter: Payer: 59 | Admitting: Family Medicine

## 2015-10-03 ENCOUNTER — Other Ambulatory Visit: Payer: Self-pay | Admitting: *Deleted

## 2015-10-03 ENCOUNTER — Telehealth: Payer: Self-pay | Admitting: Oncology

## 2015-10-03 ENCOUNTER — Encounter: Payer: Self-pay | Admitting: *Deleted

## 2015-10-03 ENCOUNTER — Ambulatory Visit: Payer: 59 | Attending: Oncology | Admitting: Physical Therapy

## 2015-10-03 ENCOUNTER — Ambulatory Visit
Admission: RE | Admit: 2015-10-03 | Discharge: 2015-10-03 | Disposition: A | Discharge status: Home / Self Care | Payer: 59 | Source: Ambulatory Visit | Attending: Radiation Oncology | Admitting: Radiation Oncology

## 2015-10-03 ENCOUNTER — Ambulatory Visit: Payer: 59 | Admitting: Nutrition

## 2015-10-03 ENCOUNTER — Ambulatory Visit (HOSPITAL_BASED_OUTPATIENT_CLINIC_OR_DEPARTMENT_OTHER): Payer: 59 | Admitting: Oncology

## 2015-10-03 ENCOUNTER — Encounter: Payer: Self-pay | Admitting: Physical Therapy

## 2015-10-03 VITALS — BP 109/68 | HR 61 | Temp 97.5°F | Resp 18 | Ht 69.0 in | Wt 151.4 lb

## 2015-10-03 DIAGNOSIS — R293 Abnormal posture: Secondary | ICD-10-CM | POA: Diagnosis present

## 2015-10-03 DIAGNOSIS — C250 Malignant neoplasm of head of pancreas: Secondary | ICD-10-CM | POA: Insufficient documentation

## 2015-10-03 DIAGNOSIS — R634 Abnormal weight loss: Secondary | ICD-10-CM | POA: Diagnosis not present

## 2015-10-03 DIAGNOSIS — R531 Weakness: Secondary | ICD-10-CM | POA: Diagnosis present

## 2015-10-03 DIAGNOSIS — G893 Neoplasm related pain (acute) (chronic): Secondary | ICD-10-CM

## 2015-10-03 MED ORDER — SENNOSIDES-DOCUSATE SODIUM 8.6-50 MG PO TABS: 1.0000 | ORAL_TABLET | Freq: Two times a day (BID) | ORAL | Status: DC

## 2015-10-03 MED ORDER — HYDROCODONE-ACETAMINOPHEN 5-325 MG PO TABS: 1.0000 | ORAL_TABLET | ORAL | Status: DC | PRN

## 2015-10-03 MED ORDER — TRAMADOL HCL 50 MG PO TABS: 50.0000 mg | ORAL_TABLET | Freq: Four times a day (QID) | ORAL | Status: DC | PRN

## 2015-10-03 NOTE — Telephone Encounter (Signed)
Gave and printed appt sched and avs for pt for March. °

## 2015-10-03 NOTE — Patient Instructions (Signed)
Contact our office if you have any questions following today's appointment: [phone].  

## 2015-10-03 NOTE — Patient Instructions (Signed)
   Care Plan Summary- 10/03/2015 Name:  Roy English     DOB: 04-06-1951 Your Medical Team: Medical Oncologist:  Dr. Ma Rings Radiation Oncologist:  Dr. Kyung Rudd   Surgeon:   Dr. Selinda Eon   Type of Cancer: Pancreatic Cancer  Stage/Grade: Stage IIA *Exact staging of your cancer is based on size of the tumor, depth of invasion, involvement of lymph nodes or not, and whether or not the cancer has spread beyond the primary site   Recommendations: Based on information available as of today's consult. Recommendations may change depending on the results of further tests or exams. 1) Neoadjuvant chemotherapy/Radiation: Xeloda-start on 10/13/15 2)  3)  Next Steps: 1) Simulation appointment on 10/06/15 at 0900 (Arrive at [facility]) 2)  Labs and chemo class on 10/06/15 (CBC, Cmet, CA 19-9) 3)  Call if pain not controlled with current pain medication  ______________________________________________________________________________ Questions? Roy Elks, RN, BSN at [phone]. Roy English is your Oncology Nurse Navigator and is available to assist you while you're receiving your medical care at [facility].

## 2015-10-03 NOTE — Telephone Encounter (Signed)
lvm for pt regarding to March appt.... °

## 2015-10-03 NOTE — Progress Notes (Signed)
[facility] Psychosocial Distress Screening Clinical Social Work  Clinical Social Work met with pt, his wife and sister at [facility] to introduce self and review distress screening protocol.  The patient scored a 2 on the Psychosocial Distress Thermometer which indicates mild distress. Clinical Social Worker reviewed resources at [facility] to assist during treatment. Pt and family deny current concerns. Pt feels very well about his plan currently. He reports MD changed his pain medications today. Pt denies transportation needs as well. CSW encouraged pt to reach out as needs arise.   ONCBCN DISTRESS SCREENING 10/03/2015  Screening Type Initial Screening  Distress experienced in past week (1-10) 2  Physical Problem type Pain;Sleep/insomnia  Referral to clinical social work Yes  Referral to dietition Yes  Referral to support programs Yes   Clinical Social Worker follow up needed: No.  If yes, follow up plan:  .Roy English, [place]  [facility] Phone: [phone] Fax: [phone]

## 2015-10-03 NOTE — Patient Instructions (Addendum)
Try these techniques to guard against pain after abdominal surgery: . Squeeze a pillow to your chest when coughing. . To get out of bed, squeeze a pillow to your chest and roll onto your side first.  Then, push up on your elbow to sit up while still hugging the pillow with the other arm. . To move to the edge of the bed, continue to hug the pillow while you scoot your hips to the edge of the bed. . If you must use your hands to assist with standing up, place your hands on your knees, lean forward, and stand up using your legs primarily.  Place as little weight on your arms as possible.  Tips for [employer] for Activities of Daily Living . Plan ahead to avoid rushing. . Sit down to bathe and dry off. Wear a terry robe instead of drying off. . Use a shower/bath organizer to decrease leaning and reaching. . Use extension handles on sponges and brushes. Susa Simmonds grab rails in the bathroom or use an elevated toilet seat. Hoyle Barr out clothes and toiletries before dressing. . Minimize leaning over to put on clothes and shoes. Bring your foot to your knee to apply socks and shoes. . Wear comfortable shoes and low-heeled, slip on shoes. Wear button front shirts rather than pullovers. Housekeeping . Schedule household tasks throughout the week. . Do housework sitting down when possible. . Delegate heavy housework, shopping, laundry and child care when possible. . Drag or slide objects rather than lifting. . Sit when ironing and take rest periods. . Stop working before becoming overly tired. Shopping . Organize list by aisle. . Use a grocery cart for support. Marland Kitchen Shop at less busy times. . Ask for help with getting to the car. Meal Preparation . Use convenience and easy-to-prepare foods. . Use small appliances that take less effort to use. Marland Kitchen Prepare meals sitting down. . Soak dishes instead of scrubbing and let dishes air dry. . Prepare double portions and freeze half. Child  Care . Plan activities that can be done sitting down, such as drawing pictures, playing games, reading, and computer games. . Encourage children to climb up onto your lap or into the highchair instead of being lifted. . Make a game of the household chores so that children will want to help. . Delegate child care when possible.  Toileting Techniques for Bowel Movements (Defecation) Using your belly (abdomen) and pelvic floor muscles to have a bowel movement is usually instinctive.  Sometimes people can have problems with these muscles and have to relearn proper defecation (emptying) techniques.  If you have weakness in your muscles, organs that are falling out, decreased sensation in your pelvis, or ignore your urge to go, you may find yourself straining to have a bowel movement.  You are straining if you are: . holding your breath or taking in a huge gulp of air and holding it  . keeping your lips and jaw tensed and closed tightly . turning red in the face because of excessive pushing or forcing . developing or worsening your  hemorrhoids . getting faint while pushing . not emptying completely and have to defecate many times a day  If you are straining, you are actually making it harder for yourself to have a bowel movement.  Many people find they are pulling up with the pelvic floor muscles and closing off instead of opening the anus. Due to lack pelvic floor relaxation and coordination the abdominal muscles, one  has to work harder to push the feces out.  Many people have never been taught how to defecate efficiently and effectively.  Notice what happens to your body when you are having a bowel movement.  While you are sitting on the toilet pay attention to the following areas: . Jaw and mouth position . Angle of your hips   . Whether your feet touch the ground or not . Arm placement  . Spine position . Waist . Belly tension . Anus (opening of the anal canal)  An Evacuation/Defecation  Plan   Here are the 4 basic points:  1. Lean forward enough for your elbows to rest on your knees 2. Support your feet on the floor or use a low stool if your feet don't touch the floor  3. Push out your belly as if you have swallowed a beach ball-you should feel a widening of your waist 4. Open and relax your pelvic floor muscles, rather than tightening around the anus      The following conditions my require modifications to your toileting posture:  . If you have had surgery in the past that limits your back, hip, pelvic, knee or ankle flexibility . Constipation   Your healthcare practitioner may make the following additional suggestions and adjustments:  1) Sit on the toilet  a) Make sure your feet are supported. b) Notice your hip angle and spine position-most people find it effective to lean forward or raise their knees, which can help the muscles around the anus to relax  c) When you lean forward, place your forearms on your thighs for support  2) Relax suggestions a) Breath deeply in through your nose and out slowly through your mouth as if you are smelling the flowers and blowing out the candles. b) To become aware of how to relax your muscles, contracting and releasing muscles can be helpful.  Pull your pelvic floor muscles in tightly by using the image of holding back gas, or closing around the anus (visualize making a circle smaller) and lifting the anus up and in.  Then release the muscles and your anus should drop down and feel open. Repeat 5 times ending with the feeling of relaxation. c) Keep your pelvic floor muscles relaxed; let your belly bulge out. d) The digestive tract starts at the mouth and ends at the anal opening, so be sure to relax both ends of the tube.  Place your tongue on the roof of your mouth with your teeth separated.  This helps relax your mouth and will help to relax the anus at the same time.  3) Empty (defecation) a) Keep your pelvic floor and  sphincter relaxed, then bulge your anal muscles.  Make the anal opening wide.  b) Stick your belly out as if you have swallowed a beach ball. c) Make your belly wall hard using your belly muscles while continuing to breathe. Doing this makes it easier to open your anus. d) Breath out and give a grunt (or try using other sounds such as ahhhh, shhhhh, ohhhh or grrrrrrr).  4) Finish a) As you finish your bowel movement, pull the pelvic floor muscles up and in.  This will leave your anus in the proper place rather than remaining pushed out and down. If you leave your anus pushed out and down, it will start to feel as though that is normal and give you incorrect signals about needing to have a bowel movement.     Ways to get started  on an exercise program 1.  Start for 10 minutes per day with a walking program. 2. Work towards 30 minutes of exercise per day 3. When you do an aerobic exercise program start on a low level 4. Water aerobics is a good place due to decreased strain on your joints 5. Begin your exercise program gradually and progress slowly over time 6. When exercising use correct form. a. Keep neutral spine b. Engage abdominals c. Keep chest up  d. Chin down e. Do not lock your knees  Scar Massage  Scar massage is done to improve the mobility of scar, decrease scar tissue from building up, reduce adhesions, and prevent Keloids from forming. Start scar massage after scabs have fallen off by themselves and no open areas. The first few weeks after surgery, it is normal for a scar to appear pink or red and slightly raised. Scars can itch or have areas of numbness. Some scars may be sensitive.   Direct Scar massage: after scar is healed, no opening, no scab 1.  Place pads of two fingers together directly on the scar starting at one end of the scar. Move the fingers up and down across the scar holding 5 seconds one direction.  Then go opposite direction hold 5 seconds.  2. Move over to  the next section of the scar and repeat.  Work your way along the entire length of the scar.   3. Next make diagonal movements along the scar holding 5 seconds at one direction. 4. Next movement is side to side. 5. Do not rub fingers over the scar.  Instead keep firm pressure and move scar over the tissue it is on top   Scar Lift and Roll 12 weeks after surgery. 1. Pinch a small amount of the scar between your first two fingers and thumb.  2. Roll the scar between your fingers for 5 to 15 seconds. 3. Move along the scar and repeat until you have massaged the entire length of scar.   Stop the massage and call your doctor if you notice: 1. Increased redness 2. Bleeding from scar 3. Seepage coming from the scar 4. Scar is warmer and has increased pain  About Abdominal Massage  Abdominal massage, also called external colon massage, is a self-treatment circular massage technique that can reduce and eliminate gas and ease constipation. The colon naturally contracts in waves in a clockwise direction starting from inside the right hip, moving up toward the ribs, across the belly, and down inside the left hip.  When you perform circular abdominal massage, you help stimulate your colon's normal wave pattern of movement called peristalsis.  It is most beneficial when done after eating.  Positioning You can practice abdominal massage with oil while lying down, or in the shower with soap.  Some people find that it is just as effective to do the massage through clothing while sitting or standing.  How to Massage Start by placing your finger tips or knuckles on your right side, just inside your hip bone.  . Make small circular movements while you move upward toward your rib cage.   . Once you reach the bottom right side of your rib cage, take your circular movements across to the left side of the bottom of your rib cage.  . Next, move downward until you reach the inside of your left hip bone.  This is  the path your feces travel in your colon. . Continue to perform your abdominal massage in this  pattern for 10 minutes each day.     You can apply as much pressure as is comfortable in your massage.  Start gently and build pressure as you continue to practice.  Notice any areas of pain as you massage; areas of slight pain may be relieved as you massage, but if you have areas of significant or intense pain, consult with your healthcare provider.  Other Considerations . General physical activity including bending and stretching can have a beneficial massage-like effect on the colon.  Deep breathing can also stimulate the colon because breathing deeply activates the same nervous system that supplies the colon.   Abdominal massage should always be used in combination with a bowel-conscious diet that is high in the proper type of fiber for you, fluids (primarily water), and a regular exercise program.    [facility] [address], [place], [place] [address] Phone # [phone] Fax [phone]

## 2015-10-03 NOTE — Progress Notes (Signed)
Radiation Oncology         [phone] ________________________________  Name: Roy English MRN: [id number]  Date: 10/03/2015  DOB: 1951/04/19  [id number] Crissman, MD  Guadalupe Maple, MD     REFERRING PHYSICIAN: Guadalupe Maple, MD   DIAGNOSIS: The encounter diagnosis was Malignant neoplasm of head of pancreas ([place]).   HISTORY OF PRESENT ILLNESS:Roy English is a 65 y.o. male seen at the request of Dr. Carlean Purl for a new diagnosis of a newly diagnosed pancreatic cancer. The patient reports he had abdominal pain, weight loss and elevated bilirubin which led to an abdominal ultrasound on 09/03/15 revealing prominent sized gallbladder without stones, and dilated CBD at 68mm. An MR of the abdomen with MRCP revealed a 2.5 x 2.8 cm lesion in the head of the pancreas. On the noncontrast series the lesion was hypovascular measuring 2.6 x 2.2 cm. Mild dilatation of the pancreatic duct measuring 3 mm was present through the body and tail. The celiac trunk and SMA are well removed from the site, and no peripancreatic or periportal lymphadenopathy was identified. The gallbladder was moderately distended at 4.4 cm there is no evidence of stone was present. The hepatic ducts measured 12 mm and the common bile duct measured 15 mm. There is abrupt narrowing of the distal common bile duct. ERCP was then performed on 09/08/15, and the common bile duct was cannulated with contrast revealing a distal common duct stricture and stent was placed across the mid to distal common bile duct. Cytology from procedure were nondiagnostic. It appears that his T bili went from 3.1 to 1.5 following the procedure.Unfortunately the patient will hospitalized due to transaminitis between 09/21/2015 and 09/24/2015. He subsequently underwent EUS at [facility] with a biopsy of the head of the pancreas on 09/26/15 revealing scattered malignant is present in the background of necrosis consistent with poorly differentiated carcinoma.  He comes today for  further discussion of the role of radiotherapy for his pancreatic cancer with Dr. Lisbeth Renshaw.   PREVIOUS RADIATION THERAPY: No   PAST MEDICAL HISTORY:  Past Medical History  Diagnosis Date  . Environmental allergies   . Arthritis   . ED (erectile dysfunction)   . Actinic keratosis   . 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 Dilated cbd, acquired   . Elevated LFTs        PAST SURGICAL HISTORY: Past Surgical History  Procedure Laterality Date  . Multiple surgeries      ribs,arms,nose,fingers  . Ac separation surgery Bilateral   . Knee arthroscopy Right   . Carpal tunnel surgery Bilateral   . Skin cancer removal       face and chest  . Ercp N/A 09/08/2015    Procedure: ENDOSCOPIC RETROGRADE CHOLANGIOPANCREATOGRAPHY (ERCP);  Surgeon: Gatha Mayer, MD;  Location: [facility];  Service: Endoscopy;  Laterality: N/A;  . Ercp N/A 09/23/2015    Procedure: ENDOSCOPIC RETROGRADE CHOLANGIOPANCREATOGRAPHY (ERCP);  Surgeon: Milus Banister, MD;  Location: [facility];  Service: Endoscopy;  Laterality: N/A;  with stent exchange  . Biliary stent placement N/A 09/23/2015    Procedure: BILIARY STENT PLACEMENT;  Surgeon: Milus Banister, MD;  Location: [facility];  Service: Endoscopy;  Laterality: N/A;     FAMILY HISTORY:  Family History  Problem Relation Age of Onset  . Heart disease Mother   . Rheum arthritis Mother   . Cancer      both sides of the family, type unknown  SOCIAL HISTORY:  reports that he has never smoked. He has never used smokeless tobacco. He reports that he drinks alcohol. He reports that he does not use illicit drugs.  He lives at [facility] and is married. He is accompanied by his sister and wife.   ALLERGIES: Penicillins   MEDICATIONS:  Current Outpatient Prescriptions  Medication Sig Dispense Refill  . cyclobenzaprine (FLEXERIL) 10 MG tablet Take 1 tablet (10 mg total) by mouth 3 (three) times daily as needed for muscle spasms. 30  tablet 0  . feeding supplement, ENSURE ENLIVE, (ENSURE ENLIVE) LIQD Take 237 mLs by mouth 2 (two) times daily between meals. 237 mL 12  . HYDROcodone-acetaminophen (NORCO/VICODIN) 5-325 MG tablet Take 1-2 tablets by mouth every 4 (four) hours as needed for moderate pain. (May take up to 8 per day.) 75 tablet 0  . lactose free nutrition (BOOST PLUS) LIQD Take 237 mLs by mouth 2 (two) times daily between meals. 60 Can 0  . ondansetron (ZOFRAN) 4 MG tablet Take 1 tablet (4 mg total) by mouth every 6 (six) hours as needed for nausea. 30 tablet 0  . Pancrelipase, Lip-Prot-Amyl, 3000-9500 units CPEP Take 1 capsule by mouth 3 (three) times daily with meals.     . senna-docusate (SENNA S) 8.6-50 MG tablet Take 1 tablet by mouth 2 (two) times daily. 60 tablet 0  . sildenafil (VIAGRA) 100 MG tablet Take 1 tablet (100 mg total) by mouth daily as needed for erectile dysfunction. 10 tablet 12  . traMADol (ULTRAM) 50 MG tablet Take 1-2 tablets (50-100 mg total) by mouth every 6 (six) hours as needed. for pain 60 tablet 0   No current facility-administered medications for this encounter.     REVIEW OF SYSTEMS: On review of systems, the patient states That he is experiencing persistent abdominal pain that is managed with narcotics every 4 hours. He states that he is not experiencing any nausea or vomiting. He is not experiencing any constipation or diarrhea at this time. He has lost about 15 pounds in the last year 64 of this was intentional, however is 20 pounds and additional weight loss began in December 2016.He denies any fevers or chills. A complete review of systems is obtained and is otherwise negative.    PHYSICAL EXAM:  Pain scale 6/10 In general this is a well appearing Caucasian male in no acute distress. He's alert and oriented x4 and appropriate throughout the examination. HEENT reveals that he is normocephalic atraumatic, EOMs are intact, and sclera is anicteric. Cardiopulmonary assessment is  negative for acute distress and he exhibits normal effort.    ECOG = 1  0 - Asymptomatic (Fully active, able to carry on all predisease activities without restriction)  1 - Symptomatic but completely ambulatory (Restricted in physically strenuous activity but ambulatory and able to carry out work of a light or sedentary nature. For example, light housework, office work)  2 - Symptomatic, <50% in bed during the day (Ambulatory and capable of all self care but unable to carry out any work activities. Up and about more than 50% of waking hours)  3 - Symptomatic, >50% in bed, but not bedbound (Capable of only limited self-care, confined to bed or chair 50% or more of waking hours)  4 - Bedbound (Completely disabled. Cannot carry on any self-care. Totally confined to bed or chair)  5 - Death   Eustace Pen MM, Creech RH, Tormey DC, et al. [phone]). "Toxicity and response criteria of the [facility]  [facility]". [place] Oncol. 5 (6): 649-55    LABORATORY DATA:  Lab Results  Component Value Date   WBC 3.2* 09/23/2015   HGB 12.1* 09/23/2015   HCT 35.8* 09/23/2015   MCV 91.1 09/23/2015   PLT 117* 09/23/2015   Lab Results  Component Value Date   NA 140 09/24/2015   K 3.5 09/24/2015   CL 106 09/24/2015   CO2 27 09/24/2015   Lab Results  Component Value Date   ALT 71* 09/24/2015   AST 36 09/24/2015   GGT 1606* 08/29/2015   ALKPHOS 436* 09/24/2015   BILITOT 0.8 09/24/2015      RADIOGRAPHY: Dg Eye Foreign Body  09/06/2015  CLINICAL DATA:  Metal working/exposure; clearance prior to MRI EXAM: ORBITS FOR FOREIGN BODY - 2 VIEW COMPARISON:  None. FINDINGS: There is no evidence of metallic foreign body within the orbits. No significant bone abnormality identified. IMPRESSION: No evidence of metallic foreign body within the orbits. Electronically Signed   By: Franchot Gallo M.D.   On: 09/06/2015 19:01   Ct Abdomen Pelvis W Contrast  09/21/2015  CLINICAL DATA:  biliary stent  placement about two weeks ago; and underwent pancreatic biopsy Wed. (4 days ago). He is here today d/t abd. Pain and fever today. EXAM: CT ABDOMEN AND PELVIS WITH CONTRAST TECHNIQUE: Multidetector CT imaging of the abdomen and pelvis was performed using the standard protocol following bolus administration of intravenous contrast. CONTRAST:  157mL OMNIPAQUE IOHEXOL 300 MG/ML SOLN, 15mL OMNIPAQUE IOHEXOL 300 MG/ML SOLN COMPARISON:  MRI, 09/06/2015 FINDINGS: Lung bases: Heterogeneous areas of lung base interstitial fibrosis. No evidence of pneumonia or edema. Heart normal in size. Hepatobiliary: No liver mass or lesion. There is intra and extrahepatic bile duct dilation despite the presence of a short biliary stent. Stent extends from the mid to distal duct into the third portion of the duodenum. Gallbladder is distended but otherwise unremarkable. Pancreas: 2.9 cm hypo attenuating pancreatic head mass unchanged from the prior MRI. Mild dilation of pancreatic duct, also stable. No pancreatic inflammation. Spleen: Unremarkable. Adrenal glands, kidneys, ureters, bladder: No adrenal masses. 14 mm posterior right midpole renal mass. This appears to be a solid enhancing mass. Small nonobstructing stone in the midpole the left kidney. No other renal abnormality. Normal ureters. Bladder is unremarkable. Lymph nodes:  No adenopathy. Ascites:  None. Gastrointestinal: Stomach and small bowel are unremarkable. Mild increased stool in the colon. Colon otherwise unremarkable. Normal appendix visualized. Musculoskeletal: Hemangioma in the T12 vertebrae with slight depression of the upper endplate. Mild degenerative changes most evident at L5-S1. No osteoblastic or osteolytic lesions. IMPRESSION: 1. Despite the presence of the biliary stent, there is intra and extrahepatic bile duct dilation. This suggests stent malfunction. The biliary stent extends beyond the ampulla of Vater, and may have migrated distally since its insertion. Tip  of the stent projects in the third portion of the duodenum. 2. 14 mm enhancing posterior midpole right renal mass. This is suspicious for small renal cell carcinoma. Electronically Signed   By: Lajean Manes M.D.   On: 09/21/2015 14:32   Dg Chest Portable 1 View  09/21/2015  CLINICAL DATA:  Woke up with new pain and fever. 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 Awaiting needle biopsy from tumor, due back tomorrow. Was told to watch out for fever and increased pain d/t risk of pancreatitis, states his urine is also getting darker.Nonsmoker. EXAM: PORTABLE CHEST 1 VIEW COMPARISON:  09/25/2012  FINDINGS: Cardiac silhouette is normal in size. No mediastinal or hilar masses or convincing adenopathy. Mild chronic scarring in the lung bases. Lungs otherwise clear with no evidence of pneumonia or edema. No pleural effusion or pneumothorax. There are old healed rib fractures on the right. IMPRESSION: No acute cardiopulmonary disease. Electronically Signed   By: Lajean Manes M.D.   On: 09/21/2015 12:28   Dg Ercp  09/23/2015  CLINICAL DATA:  ERCP for biliary obstruction EXAM: ERCP TECHNIQUE: Multiple spot images obtained with the fluoroscopic device and submitted for interpretation post-procedure. COMPARISON:  CT abdomen pelvis - 09/21/2015 FINDINGS: 4 spot intraoperative fluoroscopic images of the right upper abdominal quadrant during ERCP are provided for review. Initial image demonstrates an ERCP probe overlying the right upper abdominal quadrant. Subsequent images demonstrate selective cannulation and opacification of the common bile duct which appears at least moderately dilated. There is minimal opacification of the central aspect of the biliary tree which appears also moderately dilated. There is no definitive opacification of the cystic or pancreatic ducts. Completion images demonstrate apparent placement of a biliary stent overlying expected location the  distal aspect of the CBD. IMPRESSION: ERCP with placement of an internal biliary stent as above. These images were submitted for radiologic interpretation only. Please see the procedural report for the amount of contrast and the fluoroscopy time utilized. Electronically Signed   By: Sandi Mariscal M.D.   On: 09/23/2015 14:17   Dg Ercp Biliary & Pancreatic Ducts  09/08/2015  CLINICAL DATA:  Pancreatic mass and biliary obstruction. EXAM: ERCP TECHNIQUE: Multiple spot images obtained with the fluoroscopic device and submitted for interpretation post-procedure. COMPARISON:  MRI/ MRCP examination on 09/06/2015 FINDINGS: Imaging during the ERCP procedure demonstrates cannulation of the common bile duct with contrast injection showing dilatation of the proximal common bile duct and a distal common duct stricture. An endoscopic stent was placed spanning across the mid to distal common bile duct. IMPRESSION: Distal common bile duct stricture with proximal biliary dilatation. An endoscopic stent was placed across the stricture. These images were submitted for radiologic interpretation only. Please see the procedural report for the amount of contrast and the fluoroscopy time utilized. Electronically Signed   By: Aletta Edouard M.D.   On: 09/08/2015 15:18   Mr Jeananne Rama W/wo Cm/mrcp  09/07/2015  ADDENDUM REPORT: 09/07/2015 15:41 ADDENDUM: The case discussed with the on-call physician Dr. Collene Mares. Findings conveyed toDr. Toma Aran 09/07/2015  at15:40. Electronically Signed   By: Suzy Bouchard M.D.   On: 09/07/2015 15:41  09/07/2015  ADDENDUM REPORT: 09/07/2015 15:32 ADDENDUM: The on call gastroenterology physician was paged without success. These results will be called to the ordering clinician or representative by the Radiologist Assistant, and communication documented in the PACS or zVision Dashboard. Electronically Signed   By: Suzy Bouchard M.D.   On: 09/07/2015 15:32  09/07/2015  CLINICAL DATA:  Common bile duct dilatation on  ultrasound. Chronic abdominal pain. Elevated bilirubin. Weight loss. EXAM: MRI ABDOMEN WITHOUT AND WITH CONTRAST (INCLUDING MRCP) TECHNIQUE: Multiplanar multisequence MR imaging of the abdomen was performed both before and after the administration of intravenous contrast. Heavily T2-weighted images of the biliary and pancreatic ducts were obtained, and three-dimensional MRCP images were rendered by post processing. CONTRAST:  15 mL MultiHance COMPARISON:  Ultrasound 09/03/2015 FINDINGS: Lower chest:  Lung bases are clear. Hepatobiliary: There is significant intra and extrahepatic biliary duct dilatation. The common hepatic duct measures 12 mm and the common bile duct measures 15 mm. There is abrupt narrowing of  the distal common bile duct. There is no enhancing lesion within liver parenchyma. The gallbladder is moderately distended at 4.4 cm. No gallstones identified. Pancreas: In the head of the pancreas, there is a rounded lesion which has low signal intensity on the noncontrast T1 weighted imaging in relation to the normal high signal intensity of pancreatic parenchymal tissue. This lesion measures 2.5 x 2.8 cm (image 34, series 17). On the noncontrast series this lesion is clearly demarcated has a hypovascular lesion measuring 2.6 by 2.2 cm on image 35, series 20. There is essentially absent enhancement more delayed series. Lesion does not appear to restrict diffusion. Lesion does have some high T2 signal on image 19, series 4 There is mild dilatation of the pancreatic duct which measures 3 mm through the body and tail. No pancreatic atrophy identified There is no peripancreatic or periportal lymphadenopathy. The celiac trunk and SMA are well removed from the pancreatic head lesion. The SMA and portal vein for the proximity of the mass. Spleen: Normal spleen. Adrenals/urinary tract: Adrenal glands are normal. Kidneys are normal. Stomach/Bowel: Stomach and limited of the small bowel is unremarkable  Vascular/Lymphatic: Abdominal aortic normal caliber. No retroperitoneal periportal lymphadenopathy. Musculoskeletal: No aggressive osseous lesion. Rounded lesion in the the T12 vertebral body measuring 2.2 cm (image 10, series a has imaging characteristics most consists with benign hemangioma. IMPRESSION: 1. Rounded pancreatic head mass obstructs the common bile duct with intra and extrahepatic biliary duct dilatation. Lesion also mildly obstructs the pancreatic duct. 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 At the lesion does not restrict diffusion and there is essentially no enhancement raises the possibility of the focal pancreatitis. Recommend ERCP/EUS for relief of the biliary obstruction and tissue sampling. 2. The superior mesenteric artery and celiac branches appeared clear of the mass. 3. No hepatic liver lesions.  No significant lymphadenopathy. 4. Distention of the gallbladder likely related to the biliary obstruction. Electronically Signed: By: Suzy Bouchard M.D. 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  PLAN: Dr. Lisbeth Renshaw discusses with the patient the rationale for moving forward with radiotherapy. He discusses that he Dr. Benay Spice has outlined the recommendations for ongoing Xeloda with radiotherapy followed by repeat imaging, and in consideration of surgical intervention.. Dr. Lisbeth Renshaw discusses that scheme for his radiation would include 5-1/2 weeks of external beam radiotherapy to a total of approximately 28 treatments. The risks, benefits and side effect profile of radiotherapy in this setting was outlined with the patient. He is interested in moving forward. Written consent is obtained, and the patient will present on Monday for simulation at 9:00.  At the end of the conversation all questions were answered to satisfaction the patient and his family. He will continue his  narcotic pain medicine as needed, and we will be happy to provide refill this necessary moving forward. Dr. Lisbeth Renshaw also discusses that it may take up to 2 weeks time for the patient noted improvement of his pain as a result the radiation treatments.  The above documentation reflects my direct findings during this shared patient visit. Please see the separate note by Dr. Lisbeth Renshaw on this date for the remainder of the patient's plan of care.  Carola Rhine, PAC

## 2015-10-03 NOTE — Progress Notes (Signed)
Patient was seen in GI clinic.  Patient is a 65 year old male diagnosed with cancer of the pancreas and is a patient of Dr. Julieanne Manson.  Past medical history includes allergies, arthritis, and alcohol.  Medications include Flagyl and Zofran.  Labs were reviewed.  Height: 69 inches. Weight: 151.4 pounds. Usual body weight: 204 pounds December 2016. BMI: 22.35.  Patient reports he has been following a low carbohydrate diet which has resulted in 50 pound weight loss. He typically consumes 3 meals daily with small amounts of protein and vegetables. Patient began oral nutrition supplements 1 week ago and is drinking 3-4 "Plus" supplements daily providing approximately 350 cal each. Patient reports occasional nausea but denies diarrhea. Patient's physician at [facility] recommended patient try Creon; is taking 1 tablet with each meal.  Nutrition diagnosis: Increased nutrient needs related to new diagnosis of pancreas cancer as evidenced by 26% weight loss since December 2016.  Intervention:  Patient educated to consume frequent meals and snacks with high-calorie, high-protein foods. Patient agreeable to adding carbohydrates back into his diet. Recommended patient continue oral nutrition supplements four times daily. Educated patient on high-calorie high-protein foods and provided fact sheet Provided education on minimizing constipation and provided fact sheet Questions were answered.  Teach back method used.  Contact information was provided.  Monitoring, evaluation, goals: Patient will tolerate adequate calories and protein to minimize further weight loss.  Next visit: To be scheduled as needed.  **Disclaimer: This note was dictated with voice recognition software. Similar sounding words can inadvertently be transcribed and this note may contain transcription errors which may not have been corrected upon publication of note.**

## 2015-10-03 NOTE — Therapy (Signed)
[facility] [address] [place], [place], [address] Phone: [phone]   Fax:  [phone]  Physical Therapy Evaluation  Patient Details  Name: Roy English MRN: [id number] Date of Birth: 05/04/1951 Referring Provider: Dr. Betsy Coder  Encounter Date: 10/03/2015      PT End of Session - 10/03/15 1127    Visit Number 1   PT Start Time 1046   PT Stop Time 1108   PT Time Calculation (min) 22 min   Activity Tolerance Patient tolerated treatment well   Behavior During Therapy [facility] for tasks assessed/performed      Past Medical History  Diagnosis Date  . Environmental allergies   . Arthritis   . ED (erectile dysfunction)   . Actinic keratosis   . Idiopathic interstitial fibrosis of lung syndrome ([place])   . Dilated cbd, acquired   . Elevated LFTs     Past Surgical History  Procedure Laterality Date  . Multiple surgeries      ribs,arms,nose,fingers  . Ac separation surgery Bilateral   . Knee arthroscopy Right   . Carpal tunnel surgery Bilateral   . Skin cancer removal       face and chest  . Ercp N/A 09/08/2015    Procedure: ENDOSCOPIC RETROGRADE CHOLANGIOPANCREATOGRAPHY (ERCP);  Surgeon: Gatha Mayer, MD;  Location: [facility];  Service: Endoscopy;  Laterality: N/A;  . Ercp N/A 09/23/2015    Procedure: ENDOSCOPIC RETROGRADE CHOLANGIOPANCREATOGRAPHY (ERCP);  Surgeon: Milus Banister, MD;  Location: [facility];  Service: Endoscopy;  Laterality: N/A;  with stent exchange  . Biliary stent placement N/A 09/23/2015    Procedure: BILIARY STENT PLACEMENT;  Surgeon: Milus Banister, MD;  Location: [facility];  Service: Endoscopy;  Laterality: N/A;    There were no vitals filed for this visit.  Visit Diagnosis:  Weakness - Plan: PT plan of care cert/re-cert  Posture abnormality - Plan: PT plan of care cert/re-cert      Subjective Assessment - 10/03/15 1118    Subjective Patient is attending GI clinic for  Pancreatic Cancer. Therapist and team consulted with patient.    Patient Stated Goals education    Currently in Pain? Yes   Pain Score 6   low is 3/10   Pain Location Abdomen   Pain Orientation Upper   Pain Descriptors / Indicators Aching;Dull   Pain Type Acute pain   Pain Onset 1 to 4 weeks ago   Pain Frequency Intermittent   Aggravating Factors  when trouble with stent   Pain Relieving Factors pain medication   Multiple Pain Sites No            [facility] PT Assessment - 10/03/15 0001    Assessment   Medical Diagnosis Pancreatic Cancer   Referring Provider Dr. Betsy Coder   Onset Date/Surgical Date 09/17/15   Prior Therapy None   Precautions   Precautions Other (comment)   Precaution Comments cancer precautions   Balance Screen   Has the patient fallen in the past 6 months No   Has the patient had a decrease in activity level because of a fear of falling?  No   Is the patient reluctant to leave their home because of a fear of falling?  No   Prior Function   Level of Independence Independent   Vocation Full time employment   Vocation Requirements walking and climbing stairs   Leisure exercise at home   Cognition   Overall Cognitive Status Within Functional Limits  for tasks assessed   Observation/Other Assessments   Focus on Therapeutic Outcomes (FOTO)  Therapist discretion due to not exercising from pain 5% limitation   Posture/Postural Control   Posture/Postural Control Postural limitations   Postural Limitations Flexed trunk  due to abominal tightness   AROM   Lumbar Extension decreased by 50%   Strength   Overall Strength Comments trunk extension strength is 3/5; bilateral hip strength is 5/5   Balance   Balance Assessed --  no difficulty with single leg stance                           PT Education - 10/03/15 1125    Education provided Yes   Education Details toileting technique; scar massage; abdominal massage; tips to conserve energy,  cough with pillow, walking program   Person(s) Educated Patient;Spouse   Methods Explanation;Demonstration;Handout   Comprehension Verbalized understanding;Returned demonstration             PT Long Term Goals - 10/03/15 1132    PT LONG TERM GOAL #1   Title education on scar massage for after surgery to reduce soft tissue restrictions   Time 1   Period Days   Status Achieved   PT LONG TERM GOAL #2   Title understand correct toileting technique for constipation from medications   Time 1   Period Days   Status Achieved   PT LONG TERM GOAL #3   Title education on how to conserve energy and walking program during treatment   Time 1   Period Days   Status Achieved               Plan - 10/03/15 1127    Clinical Impression Statement Patient is a 65 year old male with diagnosis of Pancreatic Cancer on 09/17/2015 by endoscopic ultrasound.  Patient has not been exercising for 1 month due to pain.  Patient reports intermittent pain in the abdomen ranging from 3-6/10 that is dull and achy.  Patient has a tight feeling in the abdomen making it difficult to extend his lumbar spine. Lumbar extension decreased by 50% and strength is 3/5.  Patient  job is to walk alot and stairs.  Patient will start with Xeloda and radition then have surgery at [facility].  Physical therapist consulted with team on patient course of treatment.  Patient is of low complexity.  Patient benefited from physical therapy to be educated on how to manage his care.    Pt will benefit from skilled therapeutic intervention in order to improve on the following deficits Pain;Difficulty walking;Decreased strength;Decreased endurance;Impaired flexibility   Rehab Potential Excellent   Clinical Impairments Affecting Rehab Potential None   PT Frequency 1x / week   PT Duration --  1 time in GI clinic   PT Treatment/Interventions ADLs/Self Care Home Management;Patient/family education;Therapeutic exercise;Therapeutic activities    PT Next Visit Plan Only attended GI clinic   PT Home Exercise Plan Discharge to HEP   Recommended Other Services None   Consulted and Agree with Plan of Care Patient         Problem List Patient Active Problem List   Diagnosis Date Noted  . Biliary stent migration   . Acute abdominal pain 09/21/2015  . Hypokalemia 09/21/2015  . Common bile duct stricture   . Pancreatic mass   . Elevated liver enzymes 09/01/2015  . Interstitial lung disease ([place]) 06/22/2012    Earlie Counts, PT 10/03/2015 11:35 AM  [facility] [address], [place], [place], [address] Phone: [phone]   Fax:  [phone]  Name: Roy English MRN: [id number] Date of Birth: March 21, 1951

## 2015-10-03 NOTE — Progress Notes (Signed)
Oncology Nurse Navigator Documentation  Oncology Nurse Navigator Flowsheets 10/03/2015  Navigator Location [facility]  Navigator Encounter Type Clinic/[facility]  Abnormal Finding Date 09/06/2015  Confirmed Diagnosis Date 09/17/2015  Patient Visit Type MedOnc;RadOnc  Treatment Phase Pre-Tx/Tx Discussion  Barriers/Navigation Needs Education  Education Understanding Cancer/ Treatment Options;Coping with Diagnosis/ Prognosis;Pain/ Symptom Management;Newly Diagnosed Cancer Education;Preparing for Upcoming  Treatment  Interventions Coordination of Care;Education Method  Coordination of Care Appts  Education Method Verbal;Written;Teach-back  Support Groups/Services GI Support Group;American Cancer Society  Acuity Level 2  Time Spent with Patient 52   Met with patient, wife and sister and after new patient [place] visit. Explained the role of the GI Nurse Navigator and provided New Patient Packet with information on: 1. Pancreas cancer--chemotherapy, CA 19-9 info sheet 2. Support groups 3. Advanced Directives 4. Fall Safety Plan Answered questions, reviewed current treatment plan using TEACH back and provided emotional support. Provided copy of current treatment plan. Provided details on the chemo teaching class and how his Xeloda script will be processed. He is aware of his [facility] appointment on 10/06/15 at 0845/0900. Instructed him to please call if the hydrocodone/apap does not control his pain.  [name], RN, BSN GI Oncology [place]

## 2015-10-04 ENCOUNTER — Encounter: Payer: Self-pay | Admitting: Oncology

## 2015-10-04 NOTE — Progress Notes (Signed)
[facility] Patient Consult   Referring MD: Trevar Marovich 65 y.o.  Oct 22, 1950    Reason for Referral: Pancreas cancer   HPI: He developed upper abdominal pain in January. He saw his primary physician and was noted to have elevated liver enzymes. A right upper quadrant ultrasound revealed a dilated common bile duct. No gallstones or wall thickening. He was referred to Dr. Hilarie Fredrickson and an MRI of the abdomen 09/07/2015 revealed intra-and extrahepatic biliary duct dilatation. The gallbladder was distended. A rounded lesion was noted in the pancreas head measuring 2.5 x 2.8 cm. No peripancreatic adenopathy. The celiac trunk and superior mesenteric artery are removed from the pancreatic head lesion. He underwent an ERCP to 6 2017. A distal common bile duct stricture was noted. Brushings of the stricture were obtained and a plastic stent was placed. The cytology revealed rare atypical cells, not diagnostic of malignancy.  He had developed jaundice and dark urine prior to stent placement.  He was referred to [facility] and was taken to an endoscopic ultrasound procedure on 09/17/2015. A mass was confirmed in the pancreas head and a fine-needle aspiration biopsy was performed. No involvement of the superior mesenteric artery or celiac artery. No definite involvement of the portal vein or superior mesenteric vein. No lymphadenopathy. The lesion was staged as a T2 N0 tumor by ultrasound. The cytology [id number] confirmed malignant cells consistent with poorly differentiated carcinoma.  He was referred to Dr. Dossie Der who noted the tumor to be adjacent to the portal vein. He recommends neoadjuvant chemotherapy and radiation.  He presented to the emergency room on 09/21/2015 with abdominal pain and fever. He was felt to have stent migration and was taken to a repeat ERCP on 09/23/2015. The previously placed stent was noted to extend into the duodenum, likely migrated out of  position. The stent was removed. A metal stent was placed.  Elevated liver enzymes were improved following placement of the metal stent. He was discharged 09/24/2015.  He is referred to the multidisciplinary GI oncology clinic to consider neoadjuvant therapy.  Past Medical History  Diagnosis Date  . Environmental allergies   . Arthritis   . ED (erectile dysfunction)   . Actinic keratosis   . Idiopathic interstitial fibrosis of lung syndrome ([place])   . 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      Past Surgical History  Procedure Laterality Date  . Multiple surgeries      ribs,arms,nose,fingers  . Ac separation surgery Bilateral   . Knee arthroscopy Right   . Carpal tunnel surgery Bilateral   . Skin cancer removal       face and chest  . Ercp N/A 09/08/2015    Procedure: ENDOSCOPIC RETROGRADE CHOLANGIOPANCREATOGRAPHY (ERCP);  Surgeon: Gatha Mayer, MD;  Location: [facility];  Service: Endoscopy;  Laterality: N/A;  . Ercp N/A 09/23/2015    Procedure: ENDOSCOPIC RETROGRADE CHOLANGIOPANCREATOGRAPHY (ERCP);  Surgeon: Milus Banister, MD;  Location: [facility];  Service: Endoscopy;  Laterality: N/A;  with stent exchange  . Biliary stent placement N/A 09/23/2015    Procedure: BILIARY STENT PLACEMENT;  Surgeon: Milus Banister, MD;  Location: [facility];  Service: Endoscopy;  Laterality: N/A;    Medications: Reviewed  Allergies:  Allergies  Allergen Reactions  . Penicillins Other (See Comments)    Unknown reaction as a baby. Has patient had a PCN reaction causing immediate rash, facial/tongue/throat swelling, SOB  or lightheadedness with hypotension: unknown Has patient had a PCN reaction causing severe rash involving mucus membranes or skin necrosis: uknown Has patient had a PCN reaction that required hospitalization: unknown Has patient had a PCN reaction occurring within the last 10 years: Yes If all of the above answers  are "NO", then may proceed with Cephalosporin use.     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 He works in [place] and lives at [place]. He does not smoke cigarettes. He drinks 6-12 beers per day. No transfusion history. No risk factor for HIV or hepatitis.      ROS:   Positives include:Abdomen and back pain, fever prior to placement of the bile duct stent on 09/23/2015, 50 pound weight loss, mild nausea, constipation, blurred vision at night, right eye peripheral vision loss  A complete ROS was otherwise negative.  Physical Exam:  Blood pressure 109/68, pulse 61, temperature 97.5 F (36.4 C), temperature source Oral, resp. rate 18, height 5\' 9"  (1.753 m), weight 151 lb 6.4 oz (68.675 kg), SpO2 100 %.  HEENT: Oropharynx without visible mass, neck without mass  Lungs: Clear bilaterally  Cardiac: Regular rate and rhythm  Abdomen: No mass, nontender, no hepatosplenomegaly  GU: Testes without mass  Vascular: No leg edema  Lymph nodes: No cervical, supraclavicular, axillary, or inguinal nodes  Neurologic: Alert and oriented, the motor exam appears intact in the upper and lower extremities, the visual fields appear grossly intact  Skin: Multiple hypopigmented areas over the trunk, multiple areas of scaling lesions/rash over the trunk  Musculoskeletal: No spine tenderness  LAB:  CBC  Lab Results  Component Value Date   WBC 3.2* 09/23/2015   HGB 12.1* 09/23/2015   HCT 35.8* 09/23/2015   MCV 91.1 09/23/2015   PLT 117* 09/23/2015   NEUTROABS 6.3 09/21/2015     CMP      Component Value Date/Time   NA 140 09/24/2015 0413   NA 141 08/29/2015 1110   K 3.5 09/24/2015 0413   CL 106 09/24/2015 0413   CO2 27 09/24/2015 0413   GLUCOSE 170* 09/24/2015 0413   GLUCOSE 108* 08/29/2015 1110   BUN 7 09/24/2015 0413   BUN 12 08/29/2015 1110   CREATININE 0.64 09/24/2015 0413   CALCIUM 8.5*  09/24/2015 0413   PROT 5.4* 09/24/2015 0413   PROT 6.7 08/29/2015 1110   ALBUMIN 3.3* 09/24/2015 0413   ALBUMIN 4.5 08/29/2015 1110   AST 36 09/24/2015 0413   ALT 71* 09/24/2015 0413   ALKPHOS 436* 09/24/2015 0413   BILITOT 0.8 09/24/2015 0413   BILITOT 0.8 08/29/2015 1110   GFRNONAA >60 09/24/2015 0413   GFRAA >60 09/24/2015 0413     Imaging:  CTs reviewed today and at the GI tumor conference 10/01/2015   Assessment/Plan:   1. Pancreas cancer, clinical T3 N0, pancreas head mass, status post an EUS biopsy at [facility] on 09/17/2015 confirming a poorly differentiate a carcinoma  ultrasound staging 09/17/2015, T2 N0  CT chest at [facility] 09/29/2015 with patchy groundglass opacities throughout both lungs, nonspecific 5 mm right lung nodule  MRI liver 09/07/2015 revealed a pancreas head mass with bile duct obstruction, no focal liver lesions, no lymphadenopathy  CT abdomen/pelvis 09/21/2015-pancreas head mass with intra-and extrahepatic bile duct dilation despite placement of a stent, suspicion for SMV involvement  2.   Idiopathic interstitial fibrosis of the lungs  3.   Pain secondary to #  1  4.   Weight loss  5.   14 mm enhancing posterior right renal mass on CT 09/21/2015 suspicious for a renal cell carcinoma.   Disposition:   Mr. Fly has been diagnosed with pancreas cancer. His case was presented at the GI tumor conference 10/01/2015. He was seen in the multidisciplinary GI oncology clinic today. I discussed the case with Dr. Lisbeth Renshaw.  He appears to have disease localized to the pancreas with probable abutment of the superior mesenteric vein. He is a candidate for neoadjuvant therapy. I explained to Mr. Lersch and his family there is no clear best "standard" approach to neoadjuvant therapy. We have traditionally used concurrent Xeloda/radiation and this regimen has been employed by the GI oncology team at [facility].  I explained ongoing studies evaluating the role of multi agent  chemotherapy regimens used in the neoadjuvant and adjuvant settings.  We recommend proceeding with neoadjuvant Xeloda/radiation. I reviewed the potential toxicities associated with capecitabine including the chance for nausea, mucositis, diarrhea, and hematologic toxicity. We discussed the rash, hyperpigmentation, sun sensitivity, and hand/foot syndrome associated with capecitabine. He will attend a chemotherapy teaching class. He agrees to proceed.  We added hydrocodone for pain and a bowel regimen. I recommended he not drive while taking narcotic analgesics.  The anticipated start date for combined chemotherapy and radiation is 10/13/2015. We will check a chemistry panel and CA 19-9 when he returns for the chemotherapy teaching class.  Approximately 50 minutes were spent with the patient today. The majority of the time was used for counseling and coordination of care.  Betsy Coder, MD  10/04/2015, 8:21 AM

## 2015-10-06 ENCOUNTER — Telehealth: Payer: Self-pay | Admitting: Pharmacist

## 2015-10-06 ENCOUNTER — Ambulatory Visit
Admission: RE | Admit: 2015-10-06 | Discharge: 2015-10-06 | Disposition: A | Discharge status: Home / Self Care | Payer: 59 | Source: Ambulatory Visit | Attending: Radiation Oncology | Admitting: Radiation Oncology

## 2015-10-06 ENCOUNTER — Other Ambulatory Visit: Payer: 59

## 2015-10-06 ENCOUNTER — Other Ambulatory Visit (HOSPITAL_BASED_OUTPATIENT_CLINIC_OR_DEPARTMENT_OTHER): Payer: 59

## 2015-10-06 VITALS — BP 112/78 | HR 55 | Temp 97.7°F | Ht 69.0 in | Wt 152.7 lb

## 2015-10-06 DIAGNOSIS — C250 Malignant neoplasm of head of pancreas: Secondary | ICD-10-CM

## 2015-10-06 DIAGNOSIS — Z510 Encounter for antineoplastic radiation therapy: Secondary | ICD-10-CM | POA: Diagnosis not present

## 2015-10-06 LAB — CBC WITH DIFFERENTIAL/PLATELET
BASO%: 0.4 % (ref 0.0–2.0)
Basophils Absolute: 0 10*3/uL (ref 0.0–0.1)
EOS%: 3.1 % (ref 0.0–7.0)
Eosinophils Absolute: 0.2 10*3/uL (ref 0.0–0.5)
HEMATOCRIT: 43 % (ref 38.4–49.9)
HGB: 14.3 g/dL (ref 13.0–17.1)
LYMPH#: 1 10*3/uL (ref 0.9–3.3)
LYMPH%: 18.5 % (ref 14.0–49.0)
MCH: 29.7 pg (ref 27.2–33.4)
MCHC: 33.2 g/dL (ref 32.0–36.0)
MCV: 89.5 fL (ref 79.3–98.0)
MONO#: 0.5 10*3/uL (ref 0.1–0.9)
MONO%: 8.1 % (ref 0.0–14.0)
NEUT#: 3.9 10*3/uL (ref 1.5–6.5)
NEUT%: 69.9 % (ref 39.0–75.0)
Platelets: 194 10*3/uL (ref 140–400)
RBC: 4.81 10*6/uL (ref 4.20–5.82)
RDW: 12.6 % (ref 11.0–14.6)
WBC: 5.6 10*3/uL (ref 4.0–10.3)

## 2015-10-06 LAB — COMPREHENSIVE METABOLIC PANEL
ALT: 15 U/L (ref 0–55)
AST: 18 U/L (ref 5–34)
Albumin: 3.7 g/dL (ref 3.5–5.0)
Alkaline Phosphatase: 186 U/L — ABNORMAL HIGH (ref 40–150)
Anion Gap: 9 mEq/L (ref 3–11)
BUN: 7.9 mg/dL (ref 7.0–26.0)
CO2: 29 mEq/L (ref 22–29)
Calcium: 9 mg/dL (ref 8.4–10.4)
Chloride: 100 mEq/L (ref 98–109)
Creatinine: 0.7 mg/dL (ref 0.7–1.3)
EGFR: >90 mL/min/{1.73_m2} (ref >90)
Glucose: 90 mg/dl (ref 70–140)
Potassium: 4.4 mEq/L (ref 3.5–5.1)
Sodium: 138 mEq/L (ref 136–145)
Total Bilirubin: 0.73 mg/dL (ref 0.20–1.20)
Total Protein: 6.7 g/dL (ref 6.4–8.3)

## 2015-10-06 MED ORDER — SODIUM CHLORIDE 0.9% FLUSH
10.0000 mL | Freq: Once | INTRAVENOUS | Status: AC
  Administered 2015-10-06: 10 mL via INTRAVENOUS

## 2015-10-06 NOTE — Telephone Encounter (Signed)
Oral Oncology Encounter  Patient educated along with patient's wife and sister (who is a Software engineer).  He will begin treatment in XRT 10/15/15-11/21/15. Xeloda 1500 mg AM and PM on the days of radiation only. Patient's insurance requires use of specific specialty pharmacy to fill this medication. RX and paperwork faxed to [facility]. Number provided by insurance for patient to call re: benefits; number given to patient. Patient instructed to call us with problems or question; card given.  Theone Murdoch, PharmD, BCOP

## 2015-10-06 NOTE — Progress Notes (Signed)
IV start with 22 angiocath in the Left forearm.  Brisk blood return.  Denied any prior contrast reactions nor need for premeds.

## 2015-10-06 NOTE — Progress Notes (Signed)
Removed #22 gauge IV from left forearm intact.  Pressure and band aid applied.

## 2015-10-07 ENCOUNTER — Telehealth: Payer: Self-pay | Admitting: *Deleted

## 2015-10-07 LAB — CANCER ANTIGEN 19-9: CAN 19-9: 447 U/mL — AB (ref 0–35)

## 2015-10-07 MED ORDER — CAPECITABINE 500 MG PO TABS: 1500.0000 mg | ORAL_TABLET | Freq: Two times a day (BID) | ORAL | Status: DC

## 2015-10-07 NOTE — Telephone Encounter (Signed)
Oncology Nurse Navigator Documentation  Oncology Nurse Navigator Flowsheets 10/07/2015  Navigator Location [facility]  Navigator Encounter Type Telephone  Telephone Incoming Call;Outgoing Call;Medication Assistance--obtaining Xeloda  Abnormal Finding Date -  Confirmed Diagnosis Date -  Patient Visit Type -  Treatment Phase Pre-Tx/Tx Discussion  Barriers/Navigation Needs Coordination of Care w/ BirovaRx  Education -  Interventions Coordination of Care-spoke w/pharmacy to add wife to his HIPPA and explain the chemo/RT schedule. Confirmed they will be sending #120 pills (20 day supply). Office will need to call them when his next #8 day supply is needed. They can only send 1 month supply with each fill.  Coordination of Care -  Education Method Verbal  Support Groups/Services -  Acuity -  Time Spent with Patient 30  After completion of the call with pharmacy, they will call wife regarding delivery.

## 2015-10-08 ENCOUNTER — Encounter: Payer: Self-pay | Admitting: Oncology

## 2015-10-08 ENCOUNTER — Telehealth: Payer: Self-pay | Admitting: *Deleted

## 2015-10-08 DIAGNOSIS — Z510 Encounter for antineoplastic radiation therapy: Secondary | ICD-10-CM | POA: Diagnosis not present

## 2015-10-08 MED ORDER — TRAMADOL HCL 50 MG PO TABS: 50.0000 mg | ORAL_TABLET | Freq: Four times a day (QID) | ORAL | Status: DC | PRN

## 2015-10-08 NOTE — Telephone Encounter (Signed)
100 mg tablet is not available in immediate release form. Tramadol dose reviewed with Dr. Benay Spice: OK to refill with higher quantity. Rx called to pharmacy. Pt's wife made aware.

## 2015-10-08 NOTE — Progress Notes (Signed)
Called patient to introduce myself as Estate manager/land agent and to see if he has any financial questions or concerns. If patient would like, I can meet with him when he has chemoed class but i don't see it has been scheduled as of yet. Left a voicemail with my name and contact number. Also will see if patient would like to enroll in Cancer Care's copay or financial assistance.

## 2015-10-08 NOTE — Progress Notes (Signed)
Patient's spouse [name] returned my call. She wanted to set up an appointment to meet with me next week. I gathered more information from her regarding her concerns. She states he is only on a chemo pill and was told they will not have a deductible to meet or copay for that pill. If assistance was needed, I was going to see if they meet the criteria for Cancer Care copay assistance. I asked if they made below the FPG to receive the [place] and she said no they made over. Her concerns were about bills they had not received. I reviewed their self-pay balance on their account and gave her that amount and dos on the PB side. I gave her the number to billing.Gave pt Meredith's number for any Radiation related questions.

## 2015-10-08 NOTE — Telephone Encounter (Signed)
Spouse [name] called.  "He needs a refill on Tramadol.  Could he have the dose changed to 100 mg tablets or increase the quantity so it will last longer than five days.  He takes two pills every four hours around the clock."  Asked if he is getting relief.  "He is getting relief.  He doesn't weant anything stronger because they make him sleepy."

## 2015-10-09 DIAGNOSIS — Z510 Encounter for antineoplastic radiation therapy: Secondary | ICD-10-CM | POA: Diagnosis not present

## 2015-10-10 ENCOUNTER — Encounter: Payer: Self-pay | Admitting: Internal Medicine

## 2015-10-15 ENCOUNTER — Ambulatory Visit
Admission: RE | Admit: 2015-10-15 | Discharge: 2015-10-15 | Disposition: A | Discharge status: Home / Self Care | Payer: 59 | Source: Ambulatory Visit | Attending: Radiation Oncology | Admitting: Radiation Oncology

## 2015-10-15 DIAGNOSIS — Z510 Encounter for antineoplastic radiation therapy: Secondary | ICD-10-CM | POA: Diagnosis not present

## 2015-10-15 DIAGNOSIS — C250 Malignant neoplasm of head of pancreas: Secondary | ICD-10-CM

## 2015-10-15 MED ORDER — SONAFINE EX EMUL
1.0000 "application " | Freq: Two times a day (BID) | CUTANEOUS | Status: DC
  Administered 2015-10-15: 1 via TOPICAL
  Filled 2015-10-15: qty 45

## 2015-10-15 NOTE — Progress Notes (Signed)
pateint education done, radiation therapy and you book, my business crd,sonafine cream given, discussed ways to manage side effects, n,v,d, skin irritation, bladder changes, fatigue, pain, los weight, to eat 5-6 smaller meals and snacks between meals, take imodium prn diarrhea, stop eating fresh fruit and fresh vegetables if diarrhea occurs, , zofran prn nausea, eat more protein in diet, stay hydrated,drink plenty water,fluids, tach back given, use sonafine cream daily after rad txs when skin becomes irritated or itchy 3:53 PM'

## 2015-10-16 ENCOUNTER — Ambulatory Visit
Admission: RE | Admit: 2015-10-16 | Discharge: 2015-10-16 | Disposition: A | Discharge status: Home / Self Care | Payer: 59 | Source: Ambulatory Visit | Attending: Radiation Oncology | Admitting: Radiation Oncology

## 2015-10-16 DIAGNOSIS — Z510 Encounter for antineoplastic radiation therapy: Secondary | ICD-10-CM | POA: Diagnosis not present

## 2015-10-17 ENCOUNTER — Ambulatory Visit
Admission: RE | Admit: 2015-10-17 | Discharge: 2015-10-17 | Disposition: A | Discharge status: Home / Self Care | Payer: 59 | Source: Ambulatory Visit | Attending: Radiation Oncology | Admitting: Radiation Oncology

## 2015-10-17 ENCOUNTER — Encounter: Payer: Self-pay | Admitting: Pharmacist

## 2015-10-17 ENCOUNTER — Other Ambulatory Visit: Payer: Self-pay | Admitting: Oncology

## 2015-10-17 DIAGNOSIS — C250 Malignant neoplasm of head of pancreas: Secondary | ICD-10-CM

## 2015-10-17 DIAGNOSIS — Z510 Encounter for antineoplastic radiation therapy: Secondary | ICD-10-CM | POA: Diagnosis not present

## 2015-10-17 MED ORDER — ONDANSETRON HCL 8 MG PO TABS: 8.0000 mg | ORAL_TABLET | Freq: Four times a day (QID) | ORAL | Status: DC | PRN

## 2015-10-17 NOTE — Progress Notes (Signed)
Pt seen in the back by MD not sent to nursing for assessment 3:22 PM

## 2015-10-17 NOTE — Progress Notes (Signed)
Department of Radiation Oncology  Phone:  [phone] Fax:        [phone]  Weekly Treatment Note    Name: Roy English Date: 10/17/2015 MRN: [id number] DOB: 1951-01-11   Diagnosis:     ICD-9-CM ICD-10-CM   1. Malignant neoplasm of head of pancreas ([place]) 157.0 C25.0      Current dose: 6 Gy  Current fraction: 3   MEDICATIONS: Current Outpatient Prescriptions  Medication Sig Dispense Refill  . capecitabine (XELODA) 500 MG tablet Take 3 tablets (1,500 mg total) by mouth 2 (two) times daily after a meal. Take on M-F on days of RT for total of 28 doses 120 tablet 0  . cyclobenzaprine (FLEXERIL) 10 MG tablet Take 1 tablet (10 mg total) by mouth 3 (three) times daily as needed for muscle spasms. 30 tablet 0  . feeding supplement, ENSURE ENLIVE, (ENSURE ENLIVE) LIQD Take 237 mLs by mouth 2 (two) times daily between meals. 237 mL 12  . HYDROcodone-acetaminophen (NORCO/VICODIN) 5-325 MG tablet Take 1-2 tablets by mouth every 4 (four) hours as needed for moderate pain. (May take up to 8 per day.) 75 tablet 0  . lactose free nutrition (BOOST PLUS) LIQD Take 237 mLs by mouth 2 (two) times daily between meals. 60 Can 0  . ondansetron (ZOFRAN) 8 MG tablet Take 1 tablet (8 mg total) by mouth every 6 (six) hours as needed for nausea or vomiting. 30 tablet 3  . Pancrelipase, Lip-Prot-Amyl, 3000-9500 units CPEP Take 1 capsule by mouth 3 (three) times daily with meals.     . senna-docusate (SENNA S) 8.6-50 MG tablet Take 1 tablet by mouth 2 (two) times daily. 60 tablet 0  . sildenafil (VIAGRA) 100 MG tablet Take 1 tablet (100 mg total) by mouth daily as needed for erectile dysfunction. 10 tablet 12  . traMADol (ULTRAM) 50 MG tablet Take 1-2 tablets (50-100 mg total) by mouth every 6 (six) hours as needed. for pain 120 tablet 0   No current facility-administered medications for this encounter.     ALLERGIES: Penicillins   LABORATORY DATA:  Lab Results  Component Value Date   WBC 5.6  10/06/2015   HGB 14.3 10/06/2015   HCT 43.0 10/06/2015   MCV 89.5 10/06/2015   PLT 194 10/06/2015   Lab Results  Component Value Date   NA 138 10/06/2015   K 4.4 10/06/2015   CL 106 09/24/2015   CO2 29 10/06/2015   Lab Results  Component Value Date   ALT 15 10/06/2015   AST 18 10/06/2015   GGT 1606* 08/29/2015   ALKPHOS 186* 10/06/2015   BILITOT 0.73 10/06/2015     NARRATIVE: [name] was seen today for weekly treatment management. The chart was checked and the patient's films were reviewed.  The patient has had some difficulties with nausea this week. He has been taking Zofran 4 mg. The patient also has had some ongoing abdominal pain. He has been using tramadol but did take Vicodin last night which seem to help with this pain. However, the patient would like to try to continue using tramadol during the day.  PHYSICAL EXAMINATION: vitals were not taken for this visit.     Alert, in no acute distress  ASSESSMENT: The patient is doing satisfactorily with treatment.  PLAN: We will continue with the patient's radiation treatment as planned. The patient is doing satisfactorily overall. He will increase his Zofran to 8 mg and use this at least twice a day with this  chemotherapy. We discussed that he can take this up to 3 times a day as needed. He also will begin using Vicodin at night for his pain and continue using tramadol during the day. Patient is scheduled to see medical oncology next week.

## 2015-10-17 NOTE — Progress Notes (Signed)
Oral Chemotherapy Pharmacist Encounter   I spoke with patient for overview of new oral chemotherapy medication: Xeloda. Pt is doing well. The prescriptions have been sent to the [facility] for benefit analysis and approval. Rx required specialty pharmacy through [facility] at no charge for copay. Patient started on 3/15 and is doing well. Some nausea. Will call in refills of zofran. He was taking 2 4 mg tabs every 6-8 hours will change to 8 mg tabs for convenience.   Counseled patient on administration, dosing, side effects, safe handling, and monitoring. Side effects include but not limited to: Diarrhea, fatigue, hand/foot syndrome, mouth sores. Mr. Barile voiced understanding and appreciation.   All questions answered.  Will follow up in 1-2 weeks for adherence and toxicity management.   Thank you,  Montel Clock, PharmD, [facility]

## 2015-10-20 ENCOUNTER — Other Ambulatory Visit: Payer: Self-pay | Admitting: Oncology

## 2015-10-20 ENCOUNTER — Ambulatory Visit
Admission: RE | Admit: 2015-10-20 | Discharge: 2015-10-20 | Disposition: A | Discharge status: Home / Self Care | Payer: 59 | Source: Ambulatory Visit | Attending: Radiation Oncology | Admitting: Radiation Oncology

## 2015-10-20 DIAGNOSIS — Z510 Encounter for antineoplastic radiation therapy: Secondary | ICD-10-CM | POA: Diagnosis not present

## 2015-10-20 NOTE — Telephone Encounter (Signed)
Received call from pt's wife asking for refill on his tramadol to [facility] [place].  Message routed to Dr Blinda Leatherwood

## 2015-10-21 ENCOUNTER — Telehealth: Payer: Self-pay | Admitting: Oncology

## 2015-10-21 ENCOUNTER — Other Ambulatory Visit: Payer: Self-pay | Admitting: Oncology

## 2015-10-21 ENCOUNTER — Ambulatory Visit
Admission: RE | Admit: 2015-10-21 | Discharge: 2015-10-21 | Disposition: A | Discharge status: Home / Self Care | Payer: 59 | Source: Ambulatory Visit | Attending: Radiation Oncology | Admitting: Radiation Oncology

## 2015-10-21 ENCOUNTER — Ambulatory Visit (HOSPITAL_BASED_OUTPATIENT_CLINIC_OR_DEPARTMENT_OTHER): Payer: 59 | Admitting: Oncology

## 2015-10-21 VITALS — BP 109/76 | HR 60 | Temp 97.7°F | Resp 18 | Ht 69.0 in | Wt 150.8 lb

## 2015-10-21 DIAGNOSIS — G893 Neoplasm related pain (acute) (chronic): Secondary | ICD-10-CM | POA: Diagnosis not present

## 2015-10-21 DIAGNOSIS — Z510 Encounter for antineoplastic radiation therapy: Secondary | ICD-10-CM | POA: Diagnosis not present

## 2015-10-21 DIAGNOSIS — R634 Abnormal weight loss: Secondary | ICD-10-CM

## 2015-10-21 DIAGNOSIS — C250 Malignant neoplasm of head of pancreas: Secondary | ICD-10-CM

## 2015-10-21 MED ORDER — TRAMADOL HCL 50 MG PO TABS: 50.0000 mg | ORAL_TABLET | Freq: Four times a day (QID) | ORAL | Status: DC | PRN

## 2015-10-21 MED ORDER — HYDROMORPHONE HCL 2 MG PO TABS: 2.0000 mg | ORAL_TABLET | ORAL | Status: DC | PRN

## 2015-10-21 MED ORDER — TRAMADOL HCL ER 200 MG PO TB24: 200.0000 mg | ORAL_TABLET | Freq: Every day | ORAL | Status: DC

## 2015-10-21 NOTE — Progress Notes (Signed)
  Roy English OFFICE PROGRESS NOTE   Diagnosis: Pancreas cancer  INTERVAL HISTORY:   Roy English returns as scheduled. He began treatment with Xeloda and radiation on 10/15/2015. He continues to have abdominal pain. He takes tramadol every 4 hours. He took hydrocodone at night last weekend and developed nausea the following morning. No mouth sores, hand/foot pain, or diarrhea. He has constipation, relieved with Senokot. Nausea has improved with Zofran.  Objective:  Vital signs in last 24 hours:  Blood pressure 109/76, pulse 60, temperature 97.7 F (36.5 C), temperature source Oral, resp. rate 18, height 5\' 9"  (1.753 m), weight 150 lb 12.8 oz (68.402 kg), SpO2 99 %.    HEENT: No thrush or ulcers Resp: Lungs clear bilaterally Cardio: Regular rate and rhythm GI: No hepatomegaly, no mass, mild tenderness in the mid upper abdomen Vascular: No leg edema  Skin: Palms without erythema    Lab Results:  Lab Results  Component Value Date   WBC 5.6 10/06/2015   HGB 14.3 10/06/2015   HCT 43.0 10/06/2015   MCV 89.5 10/06/2015   PLT 194 10/06/2015   NEUTROABS 3.9 10/06/2015    Medications: I have reviewed the patient's current medications.  Assessment/Plan: 1. Pancreas cancer, clinical T3 N0, pancreas head mass, status post an EUS biopsy at [facility] on 09/17/2015 confirming a poorly differentiate a carcinoma  ultrasound staging 09/17/2015, T2 N0  CT chest at [facility] 09/29/2015 with patchy groundglass opacities throughout both lungs, nonspecific 5 mm right lung nodule  MRI liver 09/07/2015 revealed a pancreas head mass with bile duct obstruction, no focal liver lesions, no lymphadenopathy  CT abdomen/pelvis 09/21/2015-pancreas head mass with intra-and extrahepatic bile duct dilation despite placement of a stent, suspicion for SMV involvement  Xeloda/radiation started 10/15/2015  2. Idiopathic interstitial fibrosis of the lungs  3. Pain secondary to #1  4. Weight  loss  5. 14 mm enhancing posterior right renal mass on CT 09/21/2015 suspicious for a renal cell carcinoma.    Disposition:  Roy English appears stable. He continues to have abdomen/back pain secondary to pancreas cancer. He request long-acting tramadol. He will begin once daily extended release tramadol. He will continue tramadol every 6 hours as needed for breakthrough pain. We prescribed Dilaudid to take as needed in the evening.  He should not drive while taking narcotics. Roy English will continue daily radiation and Xeloda. We will check a CBC on 10/29/2015. He will return for an office visit 11/04/2015.  Betsy Coder, MD  10/21/2015  9:50 AM

## 2015-10-21 NOTE — Patient Instructions (Signed)
Your pain regimen has been adjusted. Take long acting Tramadol once daily at the same time every day. Use the Ultram 50 mg tablets every 6 hours as needed for breakthrough pain. Use Dilaudid for pain not relieved by Tramadol.  DO NOT DRIVE WHILE USING NARCOTICS.

## 2015-10-21 NOTE — Telephone Encounter (Signed)
per pof to sch pt appt-gave pt copy of avs °

## 2015-10-22 ENCOUNTER — Ambulatory Visit
Admission: RE | Admit: 2015-10-22 | Discharge: 2015-10-22 | Disposition: A | Discharge status: Home / Self Care | Payer: 59 | Source: Ambulatory Visit | Attending: Radiation Oncology | Admitting: Radiation Oncology

## 2015-10-22 DIAGNOSIS — Z510 Encounter for antineoplastic radiation therapy: Secondary | ICD-10-CM | POA: Diagnosis not present

## 2015-10-23 ENCOUNTER — Ambulatory Visit
Admission: RE | Admit: 2015-10-23 | Discharge: 2015-10-23 | Disposition: A | Discharge status: Home / Self Care | Payer: 59 | Source: Ambulatory Visit | Attending: Radiation Oncology | Admitting: Radiation Oncology

## 2015-10-23 ENCOUNTER — Encounter: Payer: Self-pay | Admitting: Pharmacist

## 2015-10-23 DIAGNOSIS — Z510 Encounter for antineoplastic radiation therapy: Secondary | ICD-10-CM | POA: Diagnosis not present

## 2015-10-23 NOTE — Progress Notes (Signed)
Weekly Management Note Current Dose: 16 Gy  Projected Dose: 56 Gy   Narrative:  The patient presents for routine under treatment assessment.  CBCT/MVCT images/Port film x-rays were reviewed.  The chart was checked. Doing well. Low appetite. Trying to eat small meals. Nausea controlled. Pain controlled with tramadol.   Physical Findings:   Thin. No pain.   Vitals:  Filed Vitals:   10/24/15 1532  BP: 105/65  Pulse: 57  Temp: 97.6 F (36.4 C)  Resp: 18   Weight:  Wt Readings from Last 3 Encounters:  10/24/15 151 lb 9.6 oz (68.765 kg)  10/21/15 150 lb 12.8 oz (68.402 kg)  10/06/15 152 lb 11.2 oz (69.264 kg)   Lab Results  Component Value Date   WBC 5.6 10/06/2015   HGB 14.3 10/06/2015   HCT 43.0 10/06/2015   MCV 89.5 10/06/2015   PLT 194 10/06/2015   Lab Results  Component Value Date   CREATININE 0.7 10/06/2015   BUN 7.9 10/06/2015   NA 138 10/06/2015   K 4.4 10/06/2015   CL 106 09/24/2015   CO2 29 10/06/2015     Impression:  The patient is tolerating radiation.  Plan:  Continue treatment as planned. Continue supportive care.  Discussed increasing po.    ------------------------------------------------  Thea Silversmith, MD

## 2015-10-23 NOTE — Progress Notes (Signed)
Oral Chemotherapy Follow-Up Form  Original Start date of oral chemotherapy: _3/15/17__   Called patient today to follow up regarding patient's oral chemotherapy medication: Glyn Ade - with radiation (3 tablets twice daily on M-F on days of radiation)___  Pt is doing well today. Pain is his only issue. Recently adjusted to tramadol 200 mg extended release 24-hour capsule. He has tramadol 1-2 tablets every 6 hours for breakthrough along with dilaudid. Mr. Deras states he has not tried the dilaudid yet and is trying to use as little breakthrough as possible. Instructed Mr. Olinski that it is ok to use is breakthrough pain medications as prescribed if he is in pain and that we do not want him to be in pain when we have medication that can help with this. He voiced understanding and will let us know if his pain is not controlled. As far as radiation and xeloda goes, pt is doing well. No missed doses of xeloda or side effects. Appetite and energy levels are ok. He has strong family support and his sister is a Software engineer in the area.   Pt reports _0__ tablets/doses missed in the last week    Pt reports the following side effects: __none to report_____  Other Issues: _pain due to malignancy___   Will follow up and call patient again in _2 weeks_   Thank you,  Montel Clock, PharmD, [facility]

## 2015-10-24 ENCOUNTER — Encounter: Payer: Self-pay | Admitting: Radiation Oncology

## 2015-10-24 ENCOUNTER — Ambulatory Visit
Admission: RE | Admit: 2015-10-24 | Discharge: 2015-10-24 | Disposition: A | Discharge status: Home / Self Care | Payer: 59 | Source: Ambulatory Visit | Attending: Radiation Oncology | Admitting: Radiation Oncology

## 2015-10-24 VITALS — BP 105/65 | HR 57 | Temp 97.6°F | Resp 18 | Wt 151.6 lb

## 2015-10-24 DIAGNOSIS — Z510 Encounter for antineoplastic radiation therapy: Secondary | ICD-10-CM | POA: Diagnosis not present

## 2015-10-24 DIAGNOSIS — C250 Malignant neoplasm of head of pancreas: Secondary | ICD-10-CM

## 2015-10-24 NOTE — Progress Notes (Addendum)
Weekly rad txs pancreas 8/28 completed, constipation almost gone, has nausea at night and in mornings, zofran  Prn helping, c/o gas also, and when lying down or sitting up stomach hurts some,  Takes colace and senokot,  xeloda bid, appetite good, energy level , suggested again smaller meals throughout the day, drinks ensure or boost and that helps also, energy level  Gets fatigued easily,  Works short days, pain level 1/10 now, not taking the dilaudid, took tramadol before tx BP 105/65 mmHg  Pulse 57  Temp(Src) 97.6 F (36.4 C) (Oral)  Resp 18  Wt 151 lb 9.6 oz (68.765 kg)  Wt Readings from Last 3 Encounters:  10/24/15 151 lb 9.6 oz (68.765 kg)  10/21/15 150 lb 12.8 oz (68.402 kg)  10/06/15 152 lb 11.2 oz (69.264 kg)

## 2015-10-27 ENCOUNTER — Ambulatory Visit
Admission: RE | Admit: 2015-10-27 | Discharge: 2015-10-27 | Disposition: A | Discharge status: Home / Self Care | Payer: 59 | Source: Ambulatory Visit | Attending: Radiation Oncology | Admitting: Radiation Oncology

## 2015-10-27 DIAGNOSIS — Z510 Encounter for antineoplastic radiation therapy: Secondary | ICD-10-CM | POA: Diagnosis not present

## 2015-10-28 ENCOUNTER — Ambulatory Visit
Admission: RE | Admit: 2015-10-28 | Discharge: 2015-10-28 | Disposition: A | Discharge status: Home / Self Care | Payer: 59 | Source: Ambulatory Visit | Attending: Radiation Oncology | Admitting: Radiation Oncology

## 2015-10-28 DIAGNOSIS — Z510 Encounter for antineoplastic radiation therapy: Secondary | ICD-10-CM | POA: Diagnosis not present

## 2015-10-29 ENCOUNTER — Other Ambulatory Visit: Payer: Self-pay | Admitting: Oncology

## 2015-10-29 ENCOUNTER — Other Ambulatory Visit (HOSPITAL_BASED_OUTPATIENT_CLINIC_OR_DEPARTMENT_OTHER): Payer: 59

## 2015-10-29 ENCOUNTER — Ambulatory Visit
Admission: RE | Admit: 2015-10-29 | Discharge: 2015-10-29 | Disposition: A | Discharge status: Home / Self Care | Payer: 59 | Source: Ambulatory Visit | Attending: Radiation Oncology | Admitting: Radiation Oncology

## 2015-10-29 ENCOUNTER — Other Ambulatory Visit: Payer: Self-pay

## 2015-10-29 DIAGNOSIS — C250 Malignant neoplasm of head of pancreas: Secondary | ICD-10-CM | POA: Diagnosis not present

## 2015-10-29 DIAGNOSIS — Z510 Encounter for antineoplastic radiation therapy: Secondary | ICD-10-CM | POA: Diagnosis not present

## 2015-10-29 LAB — CBC WITH DIFFERENTIAL/PLATELET
BASO%: 0.2 % (ref 0.0–2.0)
Basophils Absolute: 0 10*3/uL (ref 0.0–0.1)
EOS%: 4.2 % (ref 0.0–7.0)
Eosinophils Absolute: 0.2 10*3/uL (ref 0.0–0.5)
HEMATOCRIT: 42.4 % (ref 38.4–49.9)
HGB: 14.1 g/dL (ref 13.0–17.1)
LYMPH#: 0.3 10*3/uL — AB (ref 0.9–3.3)
LYMPH%: 7.1 % — ABNORMAL LOW (ref 14.0–49.0)
MCH: 29.7 pg (ref 27.2–33.4)
MCHC: 33.2 g/dL (ref 32.0–36.0)
MCV: 89.4 fL (ref 79.3–98.0)
MONO#: 0.5 10*3/uL (ref 0.1–0.9)
MONO%: 10.5 % (ref 0.0–14.0)
NEUT#: 3.8 10*3/uL (ref 1.5–6.5)
NEUT%: 78 % — AB (ref 39.0–75.0)
Platelets: 144 10*3/uL (ref 140–400)
RBC: 4.74 10*6/uL (ref 4.20–5.82)
RDW: 12.9 % (ref 11.0–14.6)
WBC: 4.9 10*3/uL (ref 4.0–10.3)

## 2015-10-29 NOTE — Telephone Encounter (Signed)
Voicemail from spouse: "I have called Pharmacy. Not getting anywhere with this request for tramadol 50 mg. [facility] [place]  Observed today's notes.  Called Mrs. Smet to notify her too early for refill but she says she "Just talked with another nurse.  I think we've got it straightened out now."  Call ended.

## 2015-10-29 NOTE — Telephone Encounter (Signed)
[facility] confirmed that Ultram prescription was filled on 10/21/15 at [facility] [address] [facility] [place].  Message left for pt.'s wife regarding above.

## 2015-10-29 NOTE — Telephone Encounter (Signed)
Patient's wife calling for a refill on tramadol [facility].  It appears that patient received #120 tabs on 10/21/15.

## 2015-10-30 ENCOUNTER — Ambulatory Visit
Admission: RE | Admit: 2015-10-30 | Discharge: 2015-10-30 | Disposition: A | Discharge status: Home / Self Care | Payer: 59 | Source: Ambulatory Visit | Attending: Radiation Oncology | Admitting: Radiation Oncology

## 2015-10-30 ENCOUNTER — Other Ambulatory Visit: Payer: Self-pay | Admitting: Nurse Practitioner

## 2015-10-30 DIAGNOSIS — Z510 Encounter for antineoplastic radiation therapy: Secondary | ICD-10-CM | POA: Diagnosis not present

## 2015-10-30 DIAGNOSIS — C250 Malignant neoplasm of head of pancreas: Secondary | ICD-10-CM

## 2015-10-30 MED ORDER — TRAMADOL HCL 50 MG PO TABS: 50.0000 mg | ORAL_TABLET | Freq: Four times a day (QID) | ORAL | Status: DC | PRN

## 2015-10-30 NOTE — Telephone Encounter (Signed)
Return call placed to patient's wife.  Reminded pt.'s wife that prescription is written for 2 tablets every 6 hours.  Pt.'s wife states that pt only takes Dilaudid at bed time.  Ultram prescription refilled per L. Marcello Moores NP.  Pt will be at [facility] at 3:00PM today for XRT appt and will pick up script at that time.  Pt.'s wife has no other questions or concerns at this time.

## 2015-10-30 NOTE — Telephone Encounter (Signed)
"  Someone was to call me today about his medications.  I haven't heard anything yet.  Please call me Roy English) at [phone]."

## 2015-10-31 ENCOUNTER — Ambulatory Visit
Admission: RE | Admit: 2015-10-31 | Discharge: 2015-10-31 | Disposition: A | Discharge status: Home / Self Care | Payer: 59 | Source: Ambulatory Visit | Attending: Radiation Oncology | Admitting: Radiation Oncology

## 2015-10-31 ENCOUNTER — Encounter: Payer: Self-pay | Admitting: Radiation Oncology

## 2015-10-31 VITALS — BP 95/76 | HR 90 | Temp 97.6°F | Resp 20 | Wt 148.0 lb

## 2015-10-31 DIAGNOSIS — C250 Malignant neoplasm of head of pancreas: Secondary | ICD-10-CM

## 2015-10-31 DIAGNOSIS — Z510 Encounter for antineoplastic radiation therapy: Secondary | ICD-10-CM | POA: Diagnosis not present

## 2015-10-31 NOTE — Progress Notes (Signed)
  Radiation Oncology         [phone] ________________________________  Name: Roy English MRN: [id number]  Date: 10/06/2015  DOB: 10/01/1950  SIMULATION AND TREATMENT PLANNING NOTE  DIAGNOSIS:     ICD-9-CM ICD-10-CM   1. Malignant neoplasm of head of pancreas ([place]) 157.0 C25.0      Site:  abdomen  NARRATIVE:  The patient was brought to the [facility].  Identity was confirmed.  All relevant records and images related to the planned course of therapy were reviewed.   Written consent to proceed with treatment was confirmed which was freely given after reviewing the details related to the planned course of therapy had been reviewed with the patient.  Then, the patient was set-up in a stable reproducible  supine position for radiation therapy.  CT images were obtained.  Surface markings were placed.    Medically necessary complex treatment device(s) for immobilization:  Vac-lock bag.   The CT images were loaded into the planning software.  Then the target and avoidance structures were contoured.  Treatment planning then occurred.  The radiation prescription was entered and confirmed.  . I have requested : Intensity Modulated Radiotherapy (IMRT) is medically necessary for this case for the following reason:  Small bowel sparing ; Sparing of additional adjacent critical structures including the spinal cord.   The patient will undergo daily image guidance to ensure accurate localization of the target, and adequate minimize dose to the normal surrounding structures in close proximity to the target.   PLAN:  The patient will receive 56 Gy in 28 fractions to the high risk region and 50.4 gray in 28 fractions to the surrounding adjacent at risk region.  Special treatment procedure The patient will also receive concurrent chemotherapy during the treatment. The patient may therefore experience increased toxicity or side effects and the patient will be monitored for such problems.  This may require extra lab work as necessary. This therefore constitutes a special treatment procedure.   ________________________________   Jodelle Gross, MD, PhD

## 2015-10-31 NOTE — Addendum Note (Signed)
Encounter addended by: Kyung Rudd, MD on: 10/31/2015  4:17 PM<BR>     Documentation filed: Notes Section, Visit Diagnoses

## 2015-10-31 NOTE — Progress Notes (Signed)
Weekly rad txs 13/28 abdomen, stomach and back pain,  4/10 scale at present, a lot of gas, nausea today,  Takes 4x day , very constipated, took an hour to have a very hard Bowel movement, drinks water, boost, had yopgurt with granola this am , 3 pieces bacon, jelly on toast, at 1100 nabs, ginger ale, and a smoothie, no lunch as yet,  Took ortho vitals can't pick up tramdol till tomorrow , taking dilaudid  Wt Readings from Last 3 Encounters:  10/31/15 148 lb (67.132 kg)  10/24/15 151 lb 9.6 oz (68.765 kg)  10/21/15 150 lb 12.8 oz (68.402 kg)  B/P sitting= 107/75,P=76, RR=20 T=97.6 BP 95/76 mmHg  Pulse 90  Temp(Src) 97.6 F (36.4 C) (Oral)  Resp 20  Wt 148 lb (67.132 kg) standing

## 2015-10-31 NOTE — Progress Notes (Signed)
Department of Radiation Oncology  Phone:  [phone] Fax:        [phone]  Weekly Treatment Note    Name: Roy English Date: 10/31/2015 MRN: [id number] DOB: August 19, 1950   Diagnosis:     ICD-9-CM ICD-10-CM   1. Malignant neoplasm of head of pancreas ([place]) 157.0 C25.0      Current dose: 26 Gy  Current fraction: 13   MEDICATIONS: Current Outpatient Prescriptions  Medication Sig Dispense Refill  . capecitabine (XELODA) 500 MG tablet Take 3 tablets (1,500 mg total) by mouth 2 (two) times daily after a meal. Take on M-F on days of RT for total of 28 doses 120 tablet 0  . feeding supplement, ENSURE ENLIVE, (ENSURE ENLIVE) LIQD Take 237 mLs by mouth 2 (two) times daily between meals. 237 mL 12  . HYDROmorphone (DILAUDID) 2 MG tablet Take 1-2 tablets (2-4 mg total) by mouth every 4 (four) hours as needed for severe pain. For pain not relieved by Tramadol. 30 tablet 0  . lactose free nutrition (BOOST PLUS) LIQD Take 237 mLs by mouth 2 (two) times daily between meals. 60 Can 0  . ondansetron (ZOFRAN) 8 MG tablet Take 1 tablet (8 mg total) by mouth every 6 (six) hours as needed for nausea or vomiting. 30 tablet 3  . Pancrelipase, Lip-Prot-Amyl, 3000-9500 units CPEP Take 1 capsule by mouth 3 (three) times daily with meals.     . polyethylene glycol (MIRALAX / GLYCOLAX) packet Take 17 g by mouth daily.    Marland Kitchen senna-docusate (SENNA S) 8.6-50 MG tablet Take 1 tablet by mouth 2 (two) times daily. 60 tablet 0  . traMADol (ULTRAM ER) 200 MG 24 hr tablet Take 1 tablet (200 mg total) by mouth daily. 30 tablet 0  . traMADol (ULTRAM) 50 MG tablet Take 1-2 tablets (50-100 mg total) by mouth every 6 (six) hours as needed. for pain. 120 tablet 0  . sildenafil (VIAGRA) 100 MG tablet Take 1 tablet (100 mg total) by mouth daily as needed for erectile dysfunction. (Patient not taking: Reported on 10/21/2015) 10 tablet 12   No current facility-administered medications for this encounter.      ALLERGIES: Penicillins   LABORATORY DATA:  Lab Results  Component Value Date   WBC 4.9 10/29/2015   HGB 14.1 10/29/2015   HCT 42.4 10/29/2015   MCV 89.4 10/29/2015   PLT 144 10/29/2015   Lab Results  Component Value Date   NA 138 10/06/2015   K 4.4 10/06/2015   CL 106 09/24/2015   CO2 29 10/06/2015   Lab Results  Component Value Date   ALT 15 10/06/2015   AST 18 10/06/2015   GGT 1606* 08/29/2015   ALKPHOS 186* 10/06/2015   BILITOT 0.73 10/06/2015     NARRATIVE: Roy English was seen today for weekly treatment management. The chart was checked and the patient's films were reviewed.  Weekly rad txs 13/28 abdomen, stomach and back pain,  4/10 scale at present, a lot of gas, nausea today,  Takes 4x day , very constipated, took an hour to have a very hard Bowel movement, drinks water, boost, had yopgurt with granola this am , 3 pieces bacon, jelly on toast, at 1100 nabs, ginger ale, and a smoothie, no lunch as yet,  Took ortho vitals can't pick up tramdol till tomorrow , taking dilaudid  Wt Readings from Last 3 Encounters:  10/31/15 148 lb (67.132 kg)  10/24/15 151 lb 9.6 oz (68.765 kg)  10/21/15 150 lb  12.8 oz (68.402 kg)  B/P sitting= 107/75,P=76, RR=20 T=97.6 BP 95/76 mmHg  Pulse 90  Temp(Src) 97.6 F (36.4 C) (Oral)  Resp 20  Wt 148 lb (67.132 kg) standing   PHYSICAL EXAMINATION: weight is 148 lb (67.132 kg). His oral temperature is 97.6 F (36.4 C). His blood pressure is 95/76 and his pulse is 90. His respiration is 20.        ASSESSMENT: The patient is doing satisfactorily with treatment.  PLAN: We will continue with the patient's radiation treatment as planned. The patient has begun using MiraLAX today. He will also continue to use a stool softener. The patient is also going to use dialogue did her pain over the weekend. He has previously preferred using tramadol but he will see how this works as well over the next couple of days.

## 2015-11-03 ENCOUNTER — Ambulatory Visit
Admission: RE | Admit: 2015-11-03 | Discharge: 2015-11-03 | Disposition: A | Discharge status: Home / Self Care | Payer: 59 | Source: Ambulatory Visit | Attending: Radiation Oncology | Admitting: Radiation Oncology

## 2015-11-03 DIAGNOSIS — Z510 Encounter for antineoplastic radiation therapy: Secondary | ICD-10-CM | POA: Diagnosis not present

## 2015-11-04 ENCOUNTER — Ambulatory Visit (HOSPITAL_BASED_OUTPATIENT_CLINIC_OR_DEPARTMENT_OTHER): Payer: 59 | Admitting: Nurse Practitioner

## 2015-11-04 ENCOUNTER — Telehealth: Payer: Self-pay | Admitting: Oncology

## 2015-11-04 ENCOUNTER — Ambulatory Visit
Admission: RE | Admit: 2015-11-04 | Discharge: 2015-11-04 | Disposition: A | Discharge status: Home / Self Care | Payer: 59 | Source: Ambulatory Visit | Attending: Radiation Oncology | Admitting: Radiation Oncology

## 2015-11-04 VITALS — BP 99/70 | HR 74 | Temp 97.7°F | Resp 20 | Ht 69.0 in | Wt 150.9 lb

## 2015-11-04 DIAGNOSIS — C250 Malignant neoplasm of head of pancreas: Secondary | ICD-10-CM

## 2015-11-04 DIAGNOSIS — Z510 Encounter for antineoplastic radiation therapy: Secondary | ICD-10-CM | POA: Diagnosis not present

## 2015-11-04 MED ORDER — HYDROMORPHONE HCL 2 MG PO TABS: 2.0000 mg | ORAL_TABLET | ORAL | Status: DC | PRN

## 2015-11-04 NOTE — Telephone Encounter (Signed)
Gave and printed appt shced and avs for pt for April °

## 2015-11-04 NOTE — Progress Notes (Addendum)
  [place] OFFICE PROGRESS NOTE   Diagnosis: Pancreas cancer   INTERVAL HISTORY:   Mr. Fera returns as scheduled. He continues radiation/Xeloda.he has some nausea. He takes Zofran as needed. No mouth sores. He does note that his mouth is dry. No diarrhea. No hand or foot pain or redness. He reports a rash on his back. He continues to have abdominal and back pain. He notes improved pain control with Dilaudid.  Objective:  Vital signs in last 24 hours:  Blood pressure 99/70, pulse 74, temperature 97.7 F (36.5 C), temperature source Oral, resp. rate 20, height 5\' 9"  (1.753 m), weight 150 lb 14.4 oz (68.448 kg), SpO2 98 %.    HEENT: no thrush or ulcers.  Resp: lungs clear bilaterally. Cardio: regular rate and rhythm. GI: abdomen soft and nontender. No hepatomegaly. No mass. Vascular: no leg edema.  Skin: Palms without erythema. Erythematous/dry appearing rash scattered over the trunk and extremities. Markedly erythematous rash mid to low back.   Lab Results:  Lab Results  Component Value Date   WBC 4.9 10/29/2015   HGB 14.1 10/29/2015   HCT 42.4 10/29/2015   MCV 89.4 10/29/2015   PLT 144 10/29/2015   NEUTROABS 3.8 10/29/2015    Imaging:  No results found.  Medications: I have reviewed the patient's current medications.  Assessment/Plan: 1. Pancreas cancer, clinical T3 N0, pancreas head mass, status post an EUS biopsy at [facility] on 09/17/2015 confirming a poorly differentiate a carcinoma  ultrasound staging 09/17/2015, T2 N0  CT chest at [facility] 09/29/2015 with patchy groundglass opacities throughout both lungs, nonspecific 5 mm right lung nodule  MRI liver 09/07/2015 revealed a pancreas head mass with bile duct obstruction, no focal liver lesions, no lymphadenopathy  CT abdomen/pelvis 09/21/2015-pancreas head mass with intra-and extrahepatic bile duct dilation despite placement of a stent, suspicion for SMV involvement  Xeloda/radiation started  10/15/2015  2. Idiopathic interstitial fibrosis of the lungs  3. Pain secondary to #1  4. Weight loss  5. 14 mm enhancing posterior right renal mass on CT 09/21/2015 suspicious for a renal cell carcinoma.  6.   Rash on trunk and extremities secondary to Xeloda. 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    Disposition: Mr. Hewett appears stable. He will continue Xeloda/radiation.   The rash is due to a combination of Xeloda and radiation. He understands to contact the office if the rash worsens.  He was given a new prescription for Dilaudid at today's visit. He understands that he should not be driving while taking Dilaudid.  We scheduled a return visit on 11/20/2015. He will contact the office in the interim with any problems.  Patient seen with Dr. Benay Spice.  Ned Card ANP/GNP-BC   11/04/2015  4:12 PM  this was a shared visit with Ned Card. Mr. Pienkowski was interviewed and examined. He has developed a Xeloda induced skin rash. The plan is to continue Xeloda/radiation. He will contact us for worsening of the rash.  Julieanne Manson, M.D.

## 2015-11-05 ENCOUNTER — Ambulatory Visit
Admission: RE | Admit: 2015-11-05 | Discharge: 2015-11-05 | Disposition: A | Discharge status: Home / Self Care | Payer: 59 | Source: Ambulatory Visit | Attending: Radiation Oncology | Admitting: Radiation Oncology

## 2015-11-05 DIAGNOSIS — Z510 Encounter for antineoplastic radiation therapy: Secondary | ICD-10-CM | POA: Diagnosis not present

## 2015-11-06 ENCOUNTER — Ambulatory Visit
Admission: RE | Admit: 2015-11-06 | Discharge: 2015-11-06 | Disposition: A | Discharge status: Home / Self Care | Payer: 59 | Source: Ambulatory Visit | Attending: Radiation Oncology | Admitting: Radiation Oncology

## 2015-11-06 DIAGNOSIS — Z510 Encounter for antineoplastic radiation therapy: Secondary | ICD-10-CM | POA: Diagnosis not present

## 2015-11-07 ENCOUNTER — Other Ambulatory Visit: Payer: Self-pay | Admitting: Nurse Practitioner

## 2015-11-07 ENCOUNTER — Ambulatory Visit
Admission: RE | Admit: 2015-11-07 | Discharge: 2015-11-07 | Disposition: A | Discharge status: Home / Self Care | Payer: 59 | Source: Ambulatory Visit | Attending: Radiation Oncology | Admitting: Radiation Oncology

## 2015-11-07 ENCOUNTER — Ambulatory Visit (HOSPITAL_BASED_OUTPATIENT_CLINIC_OR_DEPARTMENT_OTHER): Payer: 59 | Admitting: Nurse Practitioner

## 2015-11-07 ENCOUNTER — Telehealth: Payer: Self-pay | Admitting: Nurse Practitioner

## 2015-11-07 ENCOUNTER — Encounter: Payer: Self-pay | Admitting: Radiation Oncology

## 2015-11-07 VITALS — BP 117/70 | HR 77 | Temp 97.4°F | Resp 20

## 2015-11-07 DIAGNOSIS — Z510 Encounter for antineoplastic radiation therapy: Secondary | ICD-10-CM | POA: Diagnosis not present

## 2015-11-07 DIAGNOSIS — T451X5A Adverse effect of antineoplastic and immunosuppressive drugs, initial encounter: Secondary | ICD-10-CM

## 2015-11-07 DIAGNOSIS — R110 Nausea: Secondary | ICD-10-CM | POA: Diagnosis not present

## 2015-11-07 DIAGNOSIS — C250 Malignant neoplasm of head of pancreas: Secondary | ICD-10-CM | POA: Diagnosis not present

## 2015-11-07 DIAGNOSIS — L270 Generalized skin eruption due to drugs and medicaments taken internally: Secondary | ICD-10-CM | POA: Diagnosis not present

## 2015-11-07 DIAGNOSIS — G893 Neoplasm related pain (acute) (chronic): Secondary | ICD-10-CM | POA: Diagnosis not present

## 2015-11-07 MED ORDER — LORAZEPAM 0.5 MG PO TABS: 0.5000 mg | ORAL_TABLET | ORAL | Status: DC | PRN

## 2015-11-07 MED ORDER — PROCHLORPERAZINE MALEATE 10 MG PO TABS
10.0000 mg | ORAL_TABLET | Freq: Four times a day (QID) | ORAL | Status: DC | PRN
Start: 2015-11-07 — End: 2015-12-21

## 2015-11-07 NOTE — Telephone Encounter (Signed)
pt scheduled for 4/10 @ 1:45 left voicemail, could not schedule 2:15 appt on PA sched

## 2015-11-07 NOTE — Progress Notes (Signed)
Department of Radiation Oncology  Phone:  [phone] Fax:        [phone]  Weekly Treatment Note    Name: Roy English Date: 11/07/2015 MRN: [id number] DOB: June 07, 1951   Diagnosis:     ICD-9-CM ICD-10-CM   1. Nausea without vomiting 787.02 R11.0 LORazepam (ATIVAN) 0.5 MG tablet  2. Chemotherapy-induced nausea 787.02 R11.0 LORazepam (ATIVAN) 0.5 MG tablet   E933.1 T45.1X5A   3. Malignant neoplasm of head of pancreas (HCC) 157.0 C25.0 LORazepam (ATIVAN) 0.5 MG tablet     Current dose: 36 Gy  Current fraction: 18   MEDICATIONS: Current Outpatient Prescriptions  Medication Sig Dispense Refill  . capecitabine (XELODA) 500 MG tablet Take 3 tablets (1,500 mg total) by mouth 2 (two) times daily after a meal. Take on M-F on days of RT for total of 28 doses 120 tablet 0  . feeding supplement, ENSURE ENLIVE, (ENSURE ENLIVE) LIQD Take 237 mLs by mouth 2 (two) times daily between meals. 237 mL 12  . HYDROmorphone (DILAUDID) 2 MG tablet Take 1-2 tablets (2-4 mg total) by mouth every 4 (four) hours as needed for severe pain. For pain not relieved by Tramadol. 100 tablet 0  . lactose free nutrition (BOOST PLUS) LIQD Take 237 mLs by mouth 2 (two) times daily between meals. 60 Can 0  . ondansetron (ZOFRAN) 8 MG tablet Take 1 tablet (8 mg total) by mouth every 6 (six) hours as needed for nausea or vomiting. 30 tablet 3  . Pancrelipase, Lip-Prot-Amyl, 3000-9500 units CPEP Take 1 capsule by mouth 3 (three) times daily with meals.     . polyethylene glycol (MIRALAX / GLYCOLAX) packet Take 17 g by mouth daily.    Marland Kitchen senna-docusate (SENNA S) 8.6-50 MG tablet Take 1 tablet by mouth 2 (two) times daily. 60 tablet 0  . LORazepam (ATIVAN) 0.5 MG tablet Take 1 tablet (0.5 mg total) by mouth every 4 (four) hours as needed for anxiety (or nausea). 90 tablet 3  . prochlorperazine (COMPAZINE) 10 MG tablet Take 1 tablet (10 mg total) by mouth every 6 (six) hours as needed for nausea or vomiting. 30 tablet  1  . sildenafil (VIAGRA) 100 MG tablet Take 1 tablet (100 mg total) by mouth daily as needed for erectile dysfunction. (Patient not taking: Reported on 10/21/2015) 10 tablet 12  . traMADol (ULTRAM ER) 200 MG 24 hr tablet Take 1 tablet (200 mg total) by mouth daily. (Patient not taking: Reported on 11/04/2015) 30 tablet 0  . traMADol (ULTRAM) 50 MG tablet Take 1-2 tablets (50-100 mg total) by mouth every 6 (six) hours as needed. for pain. (Patient not taking: Reported on 11/04/2015) 120 tablet 0   No current facility-administered medications for this encounter.     ALLERGIES: Penicillins   LABORATORY DATA:  Lab Results  Component Value Date   WBC 4.9 10/29/2015   HGB 14.1 10/29/2015   HCT 42.4 10/29/2015   MCV 89.4 10/29/2015   PLT 144 10/29/2015   Lab Results  Component Value Date   NA 138 10/06/2015   K 4.4 10/06/2015   CL 106 09/24/2015   CO2 29 10/06/2015   Lab Results  Component Value Date   ALT 15 10/06/2015   AST 18 10/06/2015   GGT 1606* 08/29/2015   ALKPHOS 186* 10/06/2015   BILITOT 0.73 10/06/2015     NARRATIVE: Roy English was seen today for weekly treatment management. The chart was checked and the patient's films were reviewed.  Weekly rad tx  pancreatic ca/abdomen. Reports nausea worse yesterday and today. He is taking zofran 4x day not helping now, and takes dilaudid prn pain. 1/2 on 10 scale right now. Still having Abdominal gas. Constipation controlled better now with taking miralax daily with his coffee. Reports abdominal cramping. His Xeloda rash all over b/l arms, trunk front and back, and legs has gotten worse since Tuesday. We will let Dr. Ammie English know. He is drinking 3 to 4 Boost per day along with eating lots of food, explaining "I'm eating like a garbage can". Patient last saw Roy Card NP of medical oncology on 11/04/15. Called Roy Card NP and sent patient up to see her about patient's rash, then patient to came back.     Wt Readings from Last 3  Encounters:  11/04/15 150 lb 14.4 oz (68.448 kg)  10/31/15 148 lb (67.132 kg)  10/24/15 151 lb 9.6 oz (68.765 kg)   BP 117/70 mmHg  Pulse 77  Temp(Src) 97.4 F (36.3 C) (Oral)  Resp 20  SpO2 100%   PHYSICAL EXAMINATION: oral temperature is 97.4 F (36.3 C). His blood pressure is 117/70 and his pulse is 77. His respiration is 20 and oxygen saturation is 100%.       Has a diffuse maculopapular rash which appears more pronounced in the mid back over his radiation treatment area.   ASSESSMENT: The patient is doing satisfactorily with treatment.  PLAN: We will continue with the patient's radiation treatment as planned. Medical oncology advised for the patient to reduce Xeloda due to worsening rash. Medical oncology has written the patient a prescription for Compazine to manage worsening nausea.     ------------------------------------------------   Roy Pita, MD   This document serves as a record of services personally performed by Roy Pita, MD. It was created on his behalf by Roy English, a trained medical scribe. The creation of this record is based on the scribe's personal observations and the provider's statements to them. This document has been checked and approved by the attending provider.

## 2015-11-07 NOTE — Progress Notes (Addendum)
Weekly rad tx pancreatic ca/abdomen , nausea  Worse yesterday and today taking zofran 4x day not helping   Now, and takes dilaudid prn pain, 1/2 on 10 scale right now,   Still having  Abdominal gas, constipation controlled better now with taking miralax daily with his coffee, , his Xeloda rash all over b/l arms,trunk front and back,   And legs , has gotten worse since Tuesday, will let Dr. Ammie Dalton know , saw Ned Card NP 11/04/15  , called her and sent patient up to see her about patient's  Rash, then patient to come back to see Dr. Tammi Klippel  3:32 PM BP 117/70 mmHg  Pulse 77  Temp(Src) 97.4 F (36.3 C) (Oral)  Resp 20  SpO2 100%  Wt Readings from Last 3 Encounters:  11/04/15 150 lb 14.4 oz (68.448 kg)  10/31/15 148 lb (67.132 kg)  10/24/15 151 lb 9.6 oz (68.765 kg)

## 2015-11-07 NOTE — Progress Notes (Addendum)
  [place] OFFICE PROGRESS NOTE   Diagnosis:  Pancreas cancer  INTERVAL HISTORY:   Roy English returns prior to scheduled follow-up. The skin rash has worsened. The rash is now present in his scalp. He notes generalized pruritus. He has a mouth sore. No hand or foot pain or redness. No diarrhea. He has persistent nausea. He is taking Zofran every 6 hours around-the-clock.  Objective:  Vital signs in last 24 hours:  Temperature 97.4, heart rate 77, respirations 20, blood pressure 117/70    HEENT: Right upper inner lip ulceration. Question early mucositis lower inner lip. Resp: Lungs clear bilaterally. Cardio: Regular rate and rhythm. GI: Soft and nontender. Vascular: No edema. Skin: Palms without erythema. Erythematous/dry appearing rash scattered over the scalp, trunk, extremities. The rash is most pronounced at the mid to low back in the radiation port.    Lab Results:  Lab Results  Component Value Date   WBC 4.9 10/29/2015   HGB 14.1 10/29/2015   HCT 42.4 10/29/2015   MCV 89.4 10/29/2015   PLT 144 10/29/2015   NEUTROABS 3.8 10/29/2015    Imaging:  No results found.  Medications: I have reviewed the patient's current medications.  Assessment/Plan: 1. Pancreas cancer, clinical T3 N0, pancreas head mass, status post an EUS biopsy at [facility] on 09/17/2015 confirming a poorly differentiate a carcinoma  ultrasound staging 09/17/2015, T2 N0  CT chest at [facility] 09/29/2015 with patchy groundglass opacities throughout both lungs, nonspecific 5 mm right lung nodule  MRI liver 09/07/2015 revealed a pancreas head mass with bile duct obstruction, no focal liver lesions, no lymphadenopathy  CT abdomen/pelvis 09/21/2015-pancreas head mass with intra-and extrahepatic bile duct dilation despite placement of a stent, suspicion for SMV involvement  Xeloda/radiation started 10/15/2015  Xeloda placed on hold beginning 11/07/2015 due to progression of skin rash  2.  Idiopathic interstitial fibrosis of the lungs  3. Pain secondary to #1  4. Weight loss  5. 14 mm enhancing posterior right renal mass on CT 09/21/2015 suspicious for a renal cell carcinoma.  6. Rash on trunk and extremities secondary to Xeloda. 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 Progressive 11/07/2015. Xeloda placed on hold.    Disposition: Roy English has a progressive skin rash secondary to Xeloda/radiation. We are placing the Xeloda on hold. He will try Benadryl for the pruritus. We will reevaluate the rash on 11/10/2015.  He is having nausea. He will continue Zofran as needed. A prescription was sent to his pharmacy for Compazine 10 mg every 6 hours as needed.  He will return for a follow-up visit on 11/10/2015. He will contact the office if the rash worsens over the weekend and we will prescribe a Medrol Dosepak.  Patient seen with Dr. Benay Spice.    Ned Card ANP/GNP-BC   11/07/2015  4:22 PM  This was a shared visit with Ned Card. Roy English was interviewed and examined. He has developed a progressive Xeloda skin rash. He will hold Xeloda and return for further evaluation on 11/10/2015.  Julieanne Manson, M.D.

## 2015-11-10 ENCOUNTER — Ambulatory Visit
Admission: RE | Admit: 2015-11-10 | Discharge: 2015-11-10 | Disposition: A | Discharge status: Home / Self Care | Payer: 59 | Source: Ambulatory Visit | Attending: Radiation Oncology | Admitting: Radiation Oncology

## 2015-11-10 ENCOUNTER — Ambulatory Visit (HOSPITAL_BASED_OUTPATIENT_CLINIC_OR_DEPARTMENT_OTHER): Payer: 59 | Admitting: Oncology

## 2015-11-10 ENCOUNTER — Encounter: Payer: Self-pay | Admitting: Oncology

## 2015-11-10 ENCOUNTER — Telehealth: Payer: Self-pay | Admitting: Oncology

## 2015-11-10 VITALS — BP 97/67 | HR 67 | Temp 98.4°F | Resp 18 | Wt 148.8 lb

## 2015-11-10 DIAGNOSIS — Z510 Encounter for antineoplastic radiation therapy: Secondary | ICD-10-CM | POA: Diagnosis not present

## 2015-11-10 DIAGNOSIS — R21 Rash and other nonspecific skin eruption: Secondary | ICD-10-CM | POA: Diagnosis not present

## 2015-11-10 DIAGNOSIS — C250 Malignant neoplasm of head of pancreas: Secondary | ICD-10-CM | POA: Diagnosis not present

## 2015-11-10 MED ORDER — HYDROMORPHONE HCL 2 MG PO TABS: 2.0000 mg | ORAL_TABLET | ORAL | Status: DC | PRN

## 2015-11-10 NOTE — Progress Notes (Signed)
  [place] OFFICE PROGRESS NOTE   Diagnosis:  Pancreas cancer  INTERVAL HISTORY:   Roy English returns for follow-up of his skin rash. The skin rash is about the same, but may be slightly improved. The rash is present on his chest, back, arms, and legs. Skin is tender and itching. He has a mouth sore. No hand or foot pain or redness. No diarrhea. He has persistent nausea. He is taking Zofran every 6 hours around-the-clock. Uses Compazine as needed.   Objective:  Vital signs in last 24 hours:  Temperature 98.4, heart rate 100, respirations 18, blood pressure 97/67 Weight 148.8    HEENT: Right upper inner lip ulceration. Question early mucositis lower inner lip. Resp: Lungs clear bilaterally. Cardio: Regular rate and rhythm. GI: Soft and nontender. Vascular: No edema. Skin: Palms without erythema. Erythematous/dry appearing rash scattered over the trunk and extremities. The rash is most pronounced at the mid to low back in the radiation port.    Lab Results:  Lab Results  Component Value Date   WBC 4.9 10/29/2015   HGB 14.1 10/29/2015   HCT 42.4 10/29/2015   MCV 89.4 10/29/2015   PLT 144 10/29/2015   NEUTROABS 3.8 10/29/2015    Imaging:  No results found.  Medications: I have reviewed the patient's current medications.  Assessment/Plan: 1. Pancreas cancer, clinical T3 N0, pancreas head mass, status post an EUS biopsy at [facility] on 09/17/2015 confirming a poorly differentiate a carcinoma  ultrasound staging 09/17/2015, T2 N0  CT chest at [facility] 09/29/2015 with patchy groundglass opacities throughout both lungs, nonspecific 5 mm right lung nodule  MRI liver 09/07/2015 revealed a pancreas head mass with bile duct obstruction, no focal liver lesions, no lymphadenopathy  CT abdomen/pelvis 09/21/2015-pancreas head mass with intra-and extrahepatic bile duct dilation despite placement of a stent, suspicion for SMV involvement  Xeloda/radiation started  10/15/2015  Xeloda placed on hold beginning 11/07/2015 due to progression of skin rash  Xeloda resumed at a dose of 500 mg twice daily on days of radiation only beginning 11/10/2015  2. Idiopathic interstitial fibrosis of the lungs  3. Pain secondary to #1  4. Weight loss  5. 14 mm enhancing posterior right renal mass on CT 09/21/2015 suspicious for a renal cell carcinoma.  6. Rash on trunk and extremities secondary to Xeloda. 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 Progressive 11/07/2015. Xeloda placed on hold 11/07/2015.    Disposition: Mr. Westgate has a progressive skin rash secondary to Xeloda/radiation. Rash somewhat improved today.   Instructed to restart Xeloda tonight at a dose of 500 mg BID Monday-Friday. Will re-evaluate rash in 1 week.   Dilaudid refilled today.   Patient instructed to call us if rash worsens prior to next appointment.   Patient seen with Dr. Benay Spice.    Mikey Bussing DNP, AGPCNP-BC, AOCNP  11/10/2015  3:32 PM  This was a shared visit with Mikey Bussing. Mr. Gronlund was interviewed and examined.  The Xeloda rash appears partially improved today. He will resume Xeloda at a reduced dose.  Julieanne Manson, M.D.

## 2015-11-10 NOTE — Telephone Encounter (Signed)
Gave and printed appt shced and avs for pt for April...NO Available appts on 4.17

## 2015-11-11 ENCOUNTER — Ambulatory Visit
Admission: RE | Admit: 2015-11-11 | Discharge: 2015-11-11 | Disposition: A | Discharge status: Home / Self Care | Payer: 59 | Source: Ambulatory Visit | Attending: Radiation Oncology | Admitting: Radiation Oncology

## 2015-11-11 DIAGNOSIS — Z510 Encounter for antineoplastic radiation therapy: Secondary | ICD-10-CM | POA: Diagnosis not present

## 2015-11-12 ENCOUNTER — Ambulatory Visit
Admission: RE | Admit: 2015-11-12 | Discharge: 2015-11-12 | Disposition: A | Discharge status: Home / Self Care | Payer: 59 | Source: Ambulatory Visit | Attending: Radiation Oncology | Admitting: Radiation Oncology

## 2015-11-12 DIAGNOSIS — Z510 Encounter for antineoplastic radiation therapy: Secondary | ICD-10-CM | POA: Diagnosis not present

## 2015-11-13 ENCOUNTER — Ambulatory Visit
Admission: RE | Admit: 2015-11-13 | Discharge: 2015-11-13 | Disposition: A | Discharge status: Home / Self Care | Payer: 59 | Source: Ambulatory Visit | Attending: Radiation Oncology | Admitting: Radiation Oncology

## 2015-11-13 DIAGNOSIS — Z510 Encounter for antineoplastic radiation therapy: Secondary | ICD-10-CM | POA: Diagnosis not present

## 2015-11-14 ENCOUNTER — Ambulatory Visit: Payer: 59 | Admitting: Radiation Oncology

## 2015-11-14 ENCOUNTER — Ambulatory Visit: Admission: RE | Admit: 2015-11-14 | Payer: 59 | Source: Ambulatory Visit

## 2015-11-14 ENCOUNTER — Telehealth: Payer: Self-pay | Admitting: *Deleted

## 2015-11-14 ENCOUNTER — Ambulatory Visit
Admission: RE | Admit: 2015-11-14 | Discharge: 2015-11-14 | Disposition: A | Discharge status: Home / Self Care | Payer: 59 | Source: Ambulatory Visit | Attending: Radiation Oncology | Admitting: Radiation Oncology

## 2015-11-14 NOTE — Telephone Encounter (Signed)
  Oncology Nurse Navigator Documentation  Navigator Location: [facility]Med Onc (11/14/15 0953) Navigator Encounter Type: Telephone (11/14/15 [id number]) Telephone: Incoming Call;Appt Confirmation/Clarification (11/14/15 [id number])  Patient left VM that he can't make his RT today. Will return on Monday. Message delivered to staff on machine #4.

## 2015-11-17 ENCOUNTER — Ambulatory Visit
Admission: RE | Admit: 2015-11-17 | Discharge: 2015-11-17 | Disposition: A | Discharge status: Home / Self Care | Payer: 59 | Source: Ambulatory Visit | Attending: Radiation Oncology | Admitting: Radiation Oncology

## 2015-11-17 ENCOUNTER — Encounter: Payer: Self-pay | Admitting: Radiation Oncology

## 2015-11-17 VITALS — BP 104/72 | HR 65 | Temp 97.5°F | Resp 18 | Ht 69.0 in | Wt 148.5 lb

## 2015-11-17 DIAGNOSIS — Z510 Encounter for antineoplastic radiation therapy: Secondary | ICD-10-CM | POA: Diagnosis not present

## 2015-11-17 DIAGNOSIS — C250 Malignant neoplasm of head of pancreas: Secondary | ICD-10-CM

## 2015-11-17 NOTE — Progress Notes (Signed)
Mr. Dibb has received has received 23 fractions to the pancreas.  Skin to the abdomen with tanning and dry desquamation using Sonafine.  Appetite altered making himself eat food does not taste good.    Drinking Boost 3 to 4 times a day.   Having fatigue most of the day.  Having nausea taking Zofran and Compazine.   Pain 2/10 Dilaudid for pain control.   Taking Miralax for regular bowel movements only had one diarrhea episode.    BP 104/72 mmHg  Pulse 65  Temp(Src) 97.5 F (36.4 C) (Oral)  Resp 18  Ht 5\' 9"  (1.753 m)  Wt 148 lb 8 oz (67.359 kg)  BMI 21.92 kg/m2  SpO2 100%  Wt Readings from Last 3 Encounters:  11/17/15 148 lb 8 oz (67.359 kg)  11/10/15 148 lb 12.8 oz (67.495 kg)  11/04/15 150 lb 14.4 oz (68.448 kg)

## 2015-11-17 NOTE — Progress Notes (Signed)
Department of Radiation Oncology  Phone:  [phone] Fax:        [phone]  Weekly Treatment Note    Name: Roy English Date: 11/17/2015 MRN: [id number] DOB: 1951-02-14   Diagnosis:     ICD-9-CM ICD-10-CM   1. Malignant neoplasm of head of pancreas ([place]) 157.0 C25.0      Current dose: 41.4 Gy  Current fraction: 23   MEDICATIONS: Current Outpatient Prescriptions  Medication Sig Dispense Refill  . capecitabine (XELODA) 500 MG tablet Take 3 tablets (1,500 mg total) by mouth 2 (two) times daily after a meal. Take on M-F on days of RT for total of 28 doses 120 tablet 0  . feeding supplement, ENSURE ENLIVE, (ENSURE ENLIVE) LIQD Take 237 mLs by mouth 2 (two) times daily between meals. 237 mL 12  . HYDROmorphone (DILAUDID) 2 MG tablet Take 1-2 tablets (2-4 mg total) by mouth every 4 (four) hours as needed for severe pain. For pain not relieved by Tramadol. 100 tablet 0  . lactose free nutrition (BOOST PLUS) LIQD Take 237 mLs by mouth 2 (two) times daily between meals. 60 Can 0  . LORazepam (ATIVAN) 0.5 MG tablet Take 1 tablet (0.5 mg total) by mouth every 4 (four) hours as needed for anxiety (or nausea). 90 tablet 3  . Pancrelipase, Lip-Prot-Amyl, 3000-9500 units CPEP Take 1 capsule by mouth 3 (three) times daily with meals.     . polyethylene glycol (MIRALAX / GLYCOLAX) packet Take 17 g by mouth daily.    . prochlorperazine (COMPAZINE) 10 MG tablet Take 1 tablet (10 mg total) by mouth every 6 (six) hours as needed for nausea or vomiting. 30 tablet 1  . ondansetron (ZOFRAN) 8 MG tablet Take 1 tablet (8 mg total) by mouth every 6 (six) hours as needed for nausea or vomiting. (Patient not taking: Reported on 11/17/2015) 30 tablet 3  . senna-docusate (SENNA S) 8.6-50 MG tablet Take 1 tablet by mouth 2 (two) times daily. (Patient not taking: Reported on 11/17/2015) 60 tablet 0  . sildenafil (VIAGRA) 100 MG tablet Take 1 tablet (100 mg total) by mouth daily as needed for erectile  dysfunction. (Patient not taking: Reported on 11/17/2015) 10 tablet 12  . traMADol (ULTRAM ER) 200 MG 24 hr tablet Take 1 tablet (200 mg total) by mouth daily. (Patient not taking: Reported on 11/04/2015) 30 tablet 0  . traMADol (ULTRAM) 50 MG tablet Take 1-2 tablets (50-100 mg total) by mouth every 6 (six) hours as needed. for pain. (Patient not taking: Reported on 11/04/2015) 120 tablet 0   No current facility-administered medications for this encounter.     ALLERGIES: Penicillins   LABORATORY DATA:  Lab Results  Component Value Date   WBC 4.9 10/29/2015   HGB 14.1 10/29/2015   HCT 42.4 10/29/2015   MCV 89.4 10/29/2015   PLT 144 10/29/2015   Lab Results  Component Value Date   NA 138 10/06/2015   K 4.4 10/06/2015   CL 106 09/24/2015   CO2 29 10/06/2015   Lab Results  Component Value Date   ALT 15 10/06/2015   AST 18 10/06/2015   GGT 1606* 08/29/2015   ALKPHOS 186* 10/06/2015   BILITOT 0.73 10/06/2015     NARRATIVE: Roy English was seen today for weekly treatment management. The chart was checked and the patient's films were reviewed.  Roy English has received has received 23 fractions to the pancreas.  Skin to the abdomen with tanning and dry desquamation using Sonafine.  Appetite altered making himself eat food does not taste good.    Drinking Boost 3 to 4 times a day.   Having fatigue most of the day.  Having nausea taking Zofran and Compazine.   Pain 2/10 Dilaudid for pain control.   Taking Miralax for regular bowel movements only had one diarrhea episode.    BP 104/72 mmHg  Pulse 65  Temp(Src) 97.5 F (36.4 C) (Oral)  Resp 18  Ht 5\' 9"  (1.753 m)  Wt 148 lb 8 oz (67.359 kg)  BMI 21.92 kg/m2  SpO2 100%  Wt Readings from Last 3 Encounters:  11/17/15 148 lb 8 oz (67.359 kg)  11/10/15 148 lb 12.8 oz (67.495 kg)  11/04/15 150 lb 14.4 oz (68.448 kg)    PHYSICAL EXAMINATION: height is 5\' 9"  (1.753 m) and weight is 148 lb 8 oz (67.359 kg). His oral temperature is 97.5 F  (36.4 C). His blood pressure is 104/72 and his pulse is 65. His respiration is 18 and oxygen saturation is 100%.        ASSESSMENT: The patient is doing satisfactorily with treatment.  The patient is quite fatigued but otherwise doing quite well. No substantial new difficulties. Continued fatigue and nausea which we discussed some. We should improve as he finishes his treatment.  PLAN: We will continue with the patient's radiation treatment as planned.

## 2015-11-18 ENCOUNTER — Other Ambulatory Visit: Payer: 59

## 2015-11-18 ENCOUNTER — Ambulatory Visit
Admission: RE | Admit: 2015-11-18 | Discharge: 2015-11-18 | Disposition: A | Discharge status: Home / Self Care | Payer: 59 | Source: Ambulatory Visit | Attending: Radiation Oncology | Admitting: Radiation Oncology

## 2015-11-18 DIAGNOSIS — Z510 Encounter for antineoplastic radiation therapy: Secondary | ICD-10-CM | POA: Diagnosis not present

## 2015-11-19 ENCOUNTER — Other Ambulatory Visit (HOSPITAL_BASED_OUTPATIENT_CLINIC_OR_DEPARTMENT_OTHER): Payer: 59

## 2015-11-19 ENCOUNTER — Ambulatory Visit (HOSPITAL_BASED_OUTPATIENT_CLINIC_OR_DEPARTMENT_OTHER): Payer: 59 | Admitting: Nurse Practitioner

## 2015-11-19 ENCOUNTER — Telehealth: Payer: Self-pay | Admitting: Nurse Practitioner

## 2015-11-19 ENCOUNTER — Ambulatory Visit
Admission: RE | Admit: 2015-11-19 | Discharge: 2015-11-19 | Disposition: A | Discharge status: Home / Self Care | Payer: 59 | Source: Ambulatory Visit | Attending: Radiation Oncology | Admitting: Radiation Oncology

## 2015-11-19 VITALS — BP 107/59 | HR 69 | Temp 98.1°F | Resp 18 | Ht 69.0 in | Wt 147.4 lb

## 2015-11-19 DIAGNOSIS — C250 Malignant neoplasm of head of pancreas: Secondary | ICD-10-CM | POA: Diagnosis not present

## 2015-11-19 DIAGNOSIS — G893 Neoplasm related pain (acute) (chronic): Secondary | ICD-10-CM

## 2015-11-19 DIAGNOSIS — R21 Rash and other nonspecific skin eruption: Secondary | ICD-10-CM | POA: Diagnosis not present

## 2015-11-19 DIAGNOSIS — Z510 Encounter for antineoplastic radiation therapy: Secondary | ICD-10-CM | POA: Diagnosis not present

## 2015-11-19 DIAGNOSIS — R1310 Dysphagia, unspecified: Secondary | ICD-10-CM | POA: Diagnosis not present

## 2015-11-19 LAB — CBC WITH DIFFERENTIAL/PLATELET
BASO%: 0 % (ref 0.0–2.0)
BASOS ABS: 0 10*3/uL (ref 0.0–0.1)
EOS%: 8.7 % — ABNORMAL HIGH (ref 0.0–7.0)
Eosinophils Absolute: 0.4 10*3/uL (ref 0.0–0.5)
HEMATOCRIT: 42.1 % (ref 38.4–49.9)
HEMOGLOBIN: 14.5 g/dL (ref 13.0–17.1)
LYMPH#: 0.7 10*3/uL — AB (ref 0.9–3.3)
LYMPH%: 16.4 % (ref 14.0–49.0)
MCH: 30.8 pg (ref 27.2–33.4)
MCHC: 34.4 g/dL (ref 32.0–36.0)
MCV: 89.4 fL (ref 79.3–98.0)
MONO#: 0.2 10*3/uL (ref 0.1–0.9)
MONO%: 3.4 % (ref 0.0–14.0)
NEUT#: 3.1 10*3/uL (ref 1.5–6.5)
NEUT%: 71.5 % (ref 39.0–75.0)
PLATELETS: 142 10*3/uL (ref 140–400)
RBC: 4.71 10*6/uL (ref 4.20–5.82)
RDW: 13.8 % (ref 11.0–14.6)
WBC: 4.4 10*3/uL (ref 4.0–10.3)

## 2015-11-19 LAB — COMPREHENSIVE METABOLIC PANEL
ALBUMIN: 3.5 g/dL (ref 3.5–5.0)
ALK PHOS: 124 U/L (ref 40–150)
ANION GAP: 8 meq/L (ref 3–11)
AST: 13 U/L (ref 5–34)
BUN: 6 mg/dL — AB (ref 7.0–26.0)
CALCIUM: 8.9 mg/dL (ref 8.4–10.4)
CHLORIDE: 102 meq/L (ref 98–109)
CO2: 31 mEq/L — ABNORMAL HIGH (ref 22–29)
CREATININE: 0.7 mg/dL (ref 0.7–1.3)
EGFR: >90 mL/min/{1.73_m2} (ref >90)
Glucose: 114 mg/dl (ref 70–140)
Potassium: 4 mEq/L (ref 3.5–5.1)
Sodium: 141 mEq/L (ref 136–145)
Total Bilirubin: 0.45 mg/dL (ref 0.20–1.20)
Total Protein: 6.4 g/dL (ref 6.4–8.3)

## 2015-11-19 MED ORDER — HYDROMORPHONE HCL 2 MG PO TABS: 2.0000 mg | ORAL_TABLET | ORAL | Status: DC | PRN

## 2015-11-19 NOTE — Progress Notes (Signed)
[place] OFFICE PROGRESS NOTE   Diagnosis:  Pancreas cancer  INTERVAL HISTORY:   Mr. Cerveny returns as scheduled. He resumed Xeloda at a reduced dose on 11/10/2015. He feels the skin rash is stable. His sister thinks the rash is better. The rash continues to be pruritic. For the past 4-5 days he has had an increase in baseline abdominal pain. Bowels alternate constipation and diarrhea. No significant diarrhea. He takes Miralax as needed. He continues to have nausea. No vomiting. No fever. He is also experiencing "indigestion", mild dysphagia. He took Zantac for a few days and noted improvement in his symptoms. He would like to discontinue Xeloda and complete the course of radiation alone. We discussed the abdominal pain and fever he developed following placement of the stent in February. The current pain is not similar to the pain felt to be related to stent migration.  Objective:  Vital signs in last 24 hours:  Blood pressure 107/59, pulse 69, temperature 98.1 F (36.7 C), temperature source Oral, resp. rate 18, height 5\' 9"  (1.753 m), weight 147 lb 6.4 oz (66.86 kg), SpO2 100 %.    HEENT: No thrush or ulcers. Resp: Lungs clear bilaterally. Cardio: Regular rate and rhythm. GI: Abdomen is soft, mild generalized tenderness. Bowel sounds active. No hepatomegaly. No mass. Vascular: No leg edema. Skin: Palms without erythema.    Lab Results:  Lab Results  Component Value Date   WBC 4.4 11/19/2015   HGB 14.5 11/19/2015   HCT 42.1 11/19/2015   MCV 89.4 11/19/2015   PLT 142 11/19/2015   NEUTROABS 3.1 11/19/2015    Imaging:  No results found.  Medications: I have reviewed the patient's current medications.  Assessment/Plan: 1. Pancreas cancer, clinical T3 N0, pancreas head mass, status post an EUS biopsy at [facility] on 09/17/2015 confirming a poorly differentiate a carcinoma  ultrasound staging 09/17/2015, T2 N0  CT chest at [facility] 09/29/2015 with patchy groundglass  opacities throughout both lungs, nonspecific 5 mm right lung nodule  MRI liver 09/07/2015 revealed a pancreas head mass with bile duct obstruction, no focal liver lesions, no lymphadenopathy  CT abdomen/pelvis 09/21/2015-pancreas head mass with intra-and extrahepatic bile duct dilation despite placement of a stent, suspicion for SMV involvement  Xeloda/radiation started 10/15/2015  Xeloda placed on hold beginning 11/07/2015 due to progression of skin rash  Xeloda resumed at a reduced dose of 500 mg twice daily 11/10/2015  Xeloda discontinued 11/19/2015  2. Idiopathic interstitial fibrosis of the lungs  3. Pain secondary to #1  4. Weight loss  5. 14 mm enhancing posterior right renal mass on CT 09/21/2015 suspicious for a renal cell carcinoma.  6. Rash on trunk and extremities secondary to Xeloda. 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 Progressive 11/07/2015. Xeloda placed on hold. Resumed at reduced dose 11/10/2015. Discontinued 11/19/2015.    Disposition: Mr. Yatsko appears stable. The skin rash appears improved. Over the past 4-5 days he has been experiencing increased abdominal pain, mild dysphagia, indigestion symptoms. He feels the symptoms are likely related to Xeloda. We discussed the possibility of mucositis. We also discussed the  increased abdominal pain could be related to inflammation from radiation/Xeloda, progression of cancer, pain related to the stent.  He is most comfortable placing Xeloda on hold and completing the course of radiation. He noted improvement in his symptoms with Zantac. He will begin Zantac 300 mg daily. He can try Maalox or Mylanta as well. He will continue Dilaudid as needed for the  pain. We discussed proceeding with a restaging CT scan sooner than planned if the pain persists. He understands to contact the office if the pain worsens or he develops other concerning symptoms such as fever, diarrhea.   Review of today's labs shows  normal liver enzymes, normal hemoglobin and white count.  He will return for a follow-up visit in one week. We will see him sooner if needed.  25 minutes were spent face-to-face at today's visit with the majority of that time involved in counseling/coordination of care.    Ned Card ANP/GNP-BC   11/19/2015  2:38 PM

## 2015-11-19 NOTE — Telephone Encounter (Signed)
per pf to sch pt appt-pt to get updated sch off [place] for

## 2015-11-20 ENCOUNTER — Ambulatory Visit: Payer: 59 | Admitting: Nurse Practitioner

## 2015-11-20 ENCOUNTER — Ambulatory Visit
Admission: RE | Admit: 2015-11-20 | Discharge: 2015-11-20 | Disposition: A | Discharge status: Home / Self Care | Payer: 59 | Source: Ambulatory Visit | Attending: Radiation Oncology | Admitting: Radiation Oncology

## 2015-11-20 DIAGNOSIS — Z510 Encounter for antineoplastic radiation therapy: Secondary | ICD-10-CM | POA: Diagnosis not present

## 2015-11-21 ENCOUNTER — Ambulatory Visit: Payer: 59

## 2015-11-21 ENCOUNTER — Encounter: Payer: Self-pay | Admitting: Radiation Oncology

## 2015-11-21 ENCOUNTER — Ambulatory Visit
Admission: RE | Admit: 2015-11-21 | Discharge: 2015-11-21 | Disposition: A | Discharge status: Home / Self Care | Payer: 59 | Source: Ambulatory Visit | Attending: Radiation Oncology | Admitting: Radiation Oncology

## 2015-11-21 VITALS — BP 106/71 | HR 70 | Temp 98.1°F | Resp 12 | Wt 146.3 lb

## 2015-11-21 DIAGNOSIS — Z510 Encounter for antineoplastic radiation therapy: Secondary | ICD-10-CM | POA: Diagnosis not present

## 2015-11-21 DIAGNOSIS — C250 Malignant neoplasm of head of pancreas: Secondary | ICD-10-CM

## 2015-11-21 NOTE — Progress Notes (Signed)
Department of Radiation Oncology  Phone:  [phone] Fax:        [phone]  Weekly Treatment Note    Name: Roy English Date: 11/21/2015 MRN: [id number] DOB: 10/05/1950   Diagnosis:     ICD-9-CM ICD-10-CM   1. 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 capecitabine (XELODA) 500 MG tablet Take 3 tablets (1,500 mg total) by mouth 2 (two) times daily after a meal. Take on M-F on days of RT for total of 28 doses 120 tablet 0  . feeding supplement, ENSURE ENLIVE, (ENSURE ENLIVE) LIQD Take 237 mLs by mouth 2 (two) times daily between meals. 237 mL 12  . HYDROmorphone (DILAUDID) 2 MG tablet Take 1-2 tablets (2-4 mg total) by mouth every 4 (four) hours as needed for severe pain. For pain not relieved by Tramadol. 100 tablet 0  . lactose free nutrition (BOOST PLUS) LIQD Take 237 mLs by mouth 2 (two) times daily between meals. 60 Can 0  . LORazepam (ATIVAN) 0.5 MG tablet Take 1 tablet (0.5 mg total) by mouth every 4 (four) hours as needed for anxiety (or nausea). 90 tablet 3  . ondansetron (ZOFRAN) 8 MG tablet Take 1 tablet (8 mg total) by mouth every 6 (six) hours as needed for nausea or vomiting. 30 tablet 3  . Pancrelipase, Lip-Prot-Amyl, 3000-9500 units CPEP Take 1 capsule by mouth 3 (three) times daily with meals.     . polyethylene glycol (MIRALAX / GLYCOLAX) packet Take 17 g by mouth daily.    . prochlorperazine (COMPAZINE) 10 MG tablet Take 1 tablet (10 mg total) by mouth every 6 (six) hours as needed for nausea or vomiting. 30 tablet 1  . senna-docusate (SENNA S) 8.6-50 MG tablet Take 1 tablet by mouth 2 (two) times daily. (Patient not taking: Reported on 11/17/2015) 60 tablet 0  . sildenafil (VIAGRA) 100 MG tablet Take 1 tablet (100 mg total) by mouth daily as needed for erectile dysfunction. (Patient not taking: Reported on  11/17/2015) 10 tablet 12  . traMADol (ULTRAM ER) 200 MG 24 hr tablet Take 1 tablet (200 mg total) by mouth daily. (Patient not taking: Reported on 11/04/2015) 30 tablet 0  . traMADol (ULTRAM) 50 MG tablet Take 1-2 tablets (50-100 mg total) by mouth every 6 (six) hours as needed. for pain. (Patient not taking: Reported on 11/04/2015) 120 tablet 0   No current facility-administered medications for this encounter.     ALLERGIES: Penicillins   LABORATORY DATA:  Lab Results  Component Value Date   WBC 4.4 11/19/2015   HGB 14.5 11/19/2015   HCT 42.1 11/19/2015   MCV 89.4 11/19/2015   PLT 142 11/19/2015   Lab Results  Component Value Date   NA 141 11/19/2015   K 4.0 11/19/2015   CL 106 09/24/2015   CO2 31* 11/19/2015   Lab Results  Component Value Date   ALT <9 11/19/2015   AST 13 11/19/2015   GGT 1606* 08/29/2015   ALKPHOS 124 11/19/2015   BILITOT 0.45 11/19/2015     NARRATIVE: [name] was seen today for weekly treatment management. The chart was checked and the patient's films were reviewed.  PAIN: He rates his pain as a 3 on a scale of 0-10. Anterior abdominal area.  BOWEL: Nausea, Diarrhea and Constipation, depending on the day. SKIN: Noted systemic dry  pink rash.  Noted redness and dryness to mid/lower posterior back.  Radiaplex continued, refill given.  OTHER: Pt complains of fatigue, weakness and poor appetite. Taking Zantac.  Poor taste, drinking boost.  Carnation instant breakfast given.  WEIGHT/VS: Wt Readings from Last 3 Encounters:  11/21/15 146 lb 4.8 oz (66.361 kg)  11/19/15 147 lb 6.4 oz (66.86 kg)  11/17/15 148 lb 8 oz (67.359 kg)   BP 106/71 mmHg  Pulse 70  Temp(Src) 98.1 F (36.7 C) (Oral)  Resp 12  Wt 146 lb 4.8 oz (66.361 kg)  SpO2 99%     PHYSICAL EXAMINATION: weight is 146 lb 4.8 oz (66.361 kg). His oral temperature is 98.1 F (36.7 C). His blood pressure is 106/71 and his pulse is 70. His respiration is 12 and oxygen saturation is 99%.         ASSESSMENT: The patient is doing satisfactorily with treatment.  PLAN: We will continue with the patient's radiation treatment as planned.    The patient will finish his final fraction of radiation treatment on Monday and then will follow-up in our clinic in 1 month.

## 2015-11-21 NOTE — Progress Notes (Signed)
PAIN: He rates his pain as a 3 on a scale of 0-10. Anterior abdominal area.  BOWEL: Nausea, Diarrhea and Constipation, depending on the day. SKIN: Noted systemic dry pink rash.  Noted redness and dryness to mid/lower posterior back.  Radiaplex continued, refill given.  OTHER: Pt complains of fatigue, weakness and poor appetite. Taking Zantac.  Poor taste, drinking boost.  Carnation instant breakfast given.  WEIGHT/VS: Wt Readings from Last 3 Encounters:  11/21/15 146 lb 4.8 oz (66.361 kg)  11/19/15 147 lb 6.4 oz (66.86 kg)  11/17/15 148 lb 8 oz (67.359 kg)   BP 106/71 mmHg  Pulse 70  Temp(Src) 98.1 F (36.7 C) (Oral)  Resp 12  Wt 146 lb 4.8 oz (66.361 kg)  SpO2 99%

## 2015-11-24 ENCOUNTER — Encounter: Payer: Self-pay | Admitting: Radiation Oncology

## 2015-11-24 ENCOUNTER — Other Ambulatory Visit: Payer: Self-pay | Admitting: *Deleted

## 2015-11-24 ENCOUNTER — Ambulatory Visit: Payer: 59

## 2015-11-24 ENCOUNTER — Ambulatory Visit
Admission: RE | Admit: 2015-11-24 | Discharge: 2015-11-24 | Disposition: A | Discharge status: Home / Self Care | Payer: 59 | Source: Ambulatory Visit | Attending: Radiation Oncology | Admitting: Radiation Oncology

## 2015-11-24 VITALS — BP 98/70 | HR 85 | Temp 97.8°F | Resp 18 | Ht 69.0 in

## 2015-11-24 DIAGNOSIS — C250 Malignant neoplasm of head of pancreas: Secondary | ICD-10-CM

## 2015-11-24 DIAGNOSIS — Z510 Encounter for antineoplastic radiation therapy: Secondary | ICD-10-CM | POA: Diagnosis not present

## 2015-11-24 NOTE — Progress Notes (Signed)
Mr. Suda had received 28 fractions to his pancreas.  Skin to abdomen with slight redness and back with tanning using Radiaplex gel bid.  Appetite fair.  Drinking Ensure ,boost and gatoraide.  Got a sore throat starting on Saturday and it got worse over the weekend today it is hard to swallow slight redness of throat. 97.8.  Having rock bottom fatigue more so the past week.  Pain level 4 for abdomen and throat taking Dilaudid for pain.  No change in bowel or bladder patterns.  EOT done asked to use Radiaplex gel for two more weeks then purchase a lotion with vitamin E.  One month follow up card given to come back to see Dr. Lisbeth Renshaw.  Reminded to continue to eat and drink ans much as possible to maintain weight and hydration. BP 98/70 mmHg  Pulse 85  Temp(Src) 97.8 F (36.6 C) (Oral)  Resp 18  Ht 5\' 9"  (1.753 m)  SpO2 98%  Wt Readings from Last 3 Encounters:  11/21/15 146 lb 4.8 oz (66.361 kg)  11/19/15 147 lb 6.4 oz (66.86 kg)  11/17/15 148 lb 8 oz (67.359 kg)

## 2015-11-24 NOTE — Progress Notes (Signed)
Department of Radiation Oncology  Phone:  [phone] Fax:        [phone]  Weekly Treatment Note    Name: Roy English Date: 11/24/2015 MRN: [id number] DOB: 06-10-1951   Diagnosis:     ICD-9-CM ICD-10-CM   1. 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 capecitabine (XELODA) 500 MG tablet Take 3 tablets (1,500 mg total) by mouth 2 (two) times daily after a meal. Take on M-F on days of RT for total of 28 doses 120 tablet 0  . feeding supplement, ENSURE ENLIVE, (ENSURE ENLIVE) LIQD Take 237 mLs by mouth 2 (two) times daily between meals. 237 mL 12  . HYDROmorphone (DILAUDID) 2 MG tablet Take 1-2 tablets (2-4 mg total) by mouth every 4 (four) hours as needed for severe pain. For pain not relieved by Tramadol. 100 tablet 0  . lactose free nutrition (BOOST PLUS) LIQD Take 237 mLs by mouth 2 (two) times daily between meals. 60 Can 0  . LORazepam (ATIVAN) 0.5 MG tablet Take 1 tablet (0.5 mg total) by mouth every 4 (four) hours as needed for anxiety (or nausea). 90 tablet 3  . ondansetron (ZOFRAN) 8 MG tablet Take 1 tablet (8 mg total) by mouth every 6 (six) hours as needed for nausea or vomiting. 30 tablet 3  . Pancrelipase, Lip-Prot-Amyl, 3000-9500 units CPEP Take 1 capsule by mouth 3 (three) times daily with meals.     . polyethylene glycol (MIRALAX / GLYCOLAX) packet Take 17 g by mouth daily.    . prochlorperazine (COMPAZINE) 10 MG tablet Take 1 tablet (10 mg total) by mouth every 6 (six) hours as needed for nausea or vomiting. 30 tablet 1  . senna-docusate (SENNA S) 8.6-50 MG tablet Take 1 tablet by mouth 2 (two) times daily. (Patient not taking: Reported on 11/17/2015) 60 tablet 0  . sildenafil (VIAGRA) 100 MG tablet Take 1 tablet (100 mg total) by mouth daily as needed for erectile dysfunction. (Patient not taking: Reported on  11/17/2015) 10 tablet 12  . traMADol (ULTRAM ER) 200 MG 24 hr tablet Take 1 tablet (200 mg total) by mouth daily. (Patient not taking: Reported on 11/04/2015) 30 tablet 0  . traMADol (ULTRAM) 50 MG tablet Take 1-2 tablets (50-100 mg total) by mouth every 6 (six) hours as needed. for pain. (Patient not taking: Reported on 11/04/2015) 120 tablet 0   No current facility-administered medications for this encounter.     ALLERGIES: Penicillins   LABORATORY DATA:  Lab Results  Component Value Date   WBC 4.4 11/19/2015   HGB 14.5 11/19/2015   HCT 42.1 11/19/2015   MCV 89.4 11/19/2015   PLT 142 11/19/2015   Lab Results  Component Value Date   NA 141 11/19/2015   K 4.0 11/19/2015   CL 106 09/24/2015   CO2 31* 11/19/2015   Lab Results  Component Value Date   ALT <9 11/19/2015   AST 13 11/19/2015   GGT 1606* 08/29/2015   ALKPHOS 124 11/19/2015   BILITOT 0.45 11/19/2015     NARRATIVE: Roy English was seen today for weekly treatment management. The chart was checked and the patient's films were reviewed.  Roy English had received 28 fractions to his pancreas.  Skin to abdomen with slight redness and back with tanning using Radiaplex gel bid.  Appetite fair.  Drinking  Ensure ,boost and gatoraide.  Got a sore throat starting on Saturday and it got worse over the weekend today it is hard to swallow slight redness of throat. 97.8.  Having rock bottom fatigue more so the past week.  Pain level 4 for abdomen and throat taking Dilaudid for pain.  No change in bowel or bladder patterns.  EOT done asked to use Radiaplex gel for two more weeks then purchase a lotion with vitamin E.  One month follow up card given to come back to see Dr. Lisbeth Renshaw.  Reminded to continue to eat and drink ans much as possible to maintain weight and hydration. BP 98/70 mmHg  Pulse 85  Temp(Src) 97.8 F (36.6 C) (Oral)  Resp 18  Ht 5\' 9"  (1.753 m)  SpO2 98%  Wt Readings from Last 3 Encounters:  11/21/15 146 lb 4.8 oz (66.361  kg)  11/19/15 147 lb 6.4 oz (66.86 kg)  11/17/15 148 lb 8 oz (67.359 kg)       PHYSICAL EXAMINATION: height is 5\' 9"  (1.753 m). His oral temperature is 97.8 F (36.6 C). His blood pressure is 98/70 and his pulse is 85. His respiration is 18 and oxygen saturation is 98%.      Mild erythema in the oropharynx  ASSESSMENT: The patient is doing satisfactorily with treatment.  PLAN: We will continue with the patient's radiation treatment as planned. The patient has finished his final fraction of radiation treatment. He has a sore throat today and was seen. This is not related to the radiation treatment and I do not believe this is likely related to his systemic treatment either. The patient likely has a viral infection. I discussed this with him and the fact that this would likely take several days to his all. He will treat this symptomatically and he is scheduled to be seen by medical oncology later this week. We will have the patient return to our clinic in 1 month.

## 2015-11-26 ENCOUNTER — Telehealth: Payer: Self-pay | Admitting: Radiation Oncology

## 2015-11-26 ENCOUNTER — Telehealth: Payer: Self-pay | Admitting: *Deleted

## 2015-11-26 DIAGNOSIS — C250 Malignant neoplasm of head of pancreas: Secondary | ICD-10-CM

## 2015-11-26 NOTE — Telephone Encounter (Signed)
CALLED PATIENT TO INFORM OF CT FOR 12-24-15, LVM FOR A RETURN CALL

## 2015-11-26 NOTE — Telephone Encounter (Signed)
LM for Butch Penny, pt's sister that ok to move forward with CT per Dr. Dossie Der here at [facility], request the patient bring films with him to his appt, and they will call him to schedule. Dr. Dossie Der wants scan 1 month from completion of treatment.

## 2015-11-27 ENCOUNTER — Ambulatory Visit (HOSPITAL_BASED_OUTPATIENT_CLINIC_OR_DEPARTMENT_OTHER): Payer: 59 | Admitting: Nurse Practitioner

## 2015-11-27 ENCOUNTER — Telehealth: Payer: Self-pay | Admitting: Oncology

## 2015-11-27 ENCOUNTER — Telehealth: Payer: Self-pay | Admitting: *Deleted

## 2015-11-27 VITALS — BP 93/57 | HR 74 | Temp 98.2°F | Resp 18 | Ht 69.0 in | Wt 144.8 lb

## 2015-11-27 DIAGNOSIS — C250 Malignant neoplasm of head of pancreas: Secondary | ICD-10-CM | POA: Diagnosis not present

## 2015-11-27 DIAGNOSIS — K1231 Oral mucositis (ulcerative) due to antineoplastic therapy: Secondary | ICD-10-CM

## 2015-11-27 MED ORDER — PROCHLORPERAZINE MALEATE 10 MG PO TABS: 10.0000 mg | ORAL_TABLET | Freq: Four times a day (QID) | ORAL | Status: DC | PRN

## 2015-11-27 NOTE — Telephone Encounter (Signed)
Pt requested 11:30 appt.. 10:15 to early due to another appt..gave appt & avs

## 2015-11-27 NOTE — Telephone Encounter (Signed)
Pt requested 11:30 due to another appt... appt could not be scheduled for 66min

## 2015-11-27 NOTE — Telephone Encounter (Signed)
FYI Spouse called at 11:15 to notify us "Mir just left and will be here about 20 minutes late.  Able to reach A.P.P. At 1140 to provide this information.  Called [name] to notify her he may have to wait a while but will be worked in today.  Reports "he is tired and weak.  I tried for two hours to get him dressed and on his way but he was moving so slow.  We live an hour away."

## 2015-11-27 NOTE — Telephone Encounter (Signed)
Added appts pt will get appts in sched

## 2015-11-27 NOTE — Progress Notes (Signed)
Called [facility] pharmacy and cancelled compazine order, sent to wrong pharmacy. Reordered at [place] per pt request

## 2015-11-27 NOTE — Progress Notes (Addendum)
  [place] OFFICE PROGRESS NOTE   Diagnosis:  Pancreas cancer  INTERVAL HISTORY:   Roy English returns as scheduled. He completed radiation on 11/24/2015. He continues to have mild nausea. He continues to have abdominal pain. He is taking Dilaudid about every 6 hours. Skin rash is better. He reports a sore throat, cough. Mucous is discolored. No fever. No shortness of breath.  Objective:  Vital signs in last 24 hours:  Blood pressure 93/57, pulse 74, temperature 98.2 F (36.8 C), temperature source Oral, resp. rate 18, height 5\' 9"  (1.753 m), weight 144 lb 12.8 oz (65.681 kg), SpO2 97 %.    HEENT: Posterior palate is erythematous. No ulcerations. No exudate. Resp: Lungs clear bilaterally. Cardio: Regular rate and rhythm. GI: Abdomen soft with mild generalized tenderness. No hepatomegaly. No mass. Vascular: No leg edema. Skin: Improved generalized skin rash. Palms without erythema.    Lab Results:  Lab Results  Component Value Date   WBC 4.4 11/19/2015   HGB 14.5 11/19/2015   HCT 42.1 11/19/2015   MCV 89.4 11/19/2015   PLT 142 11/19/2015   NEUTROABS 3.1 11/19/2015    Imaging:  No results found.  Medications: I have reviewed the patient's current medications.  Assessment/Plan: 1. Pancreas cancer, clinical T3 N0, pancreas head mass, status post an EUS biopsy at [facility] on 09/17/2015 confirming a poorly differentiate a carcinoma  ultrasound staging 09/17/2015, T2 N0  CT chest at [facility] 09/29/2015 with patchy groundglass opacities throughout both lungs, nonspecific 5 mm right lung nodule  MRI liver 09/07/2015 revealed a pancreas head mass with bile duct obstruction, no focal liver lesions, no lymphadenopathy  CT abdomen/pelvis 09/21/2015-pancreas head mass with intra-and extrahepatic bile duct dilation despite placement of a stent, suspicion for SMV involvement  Xeloda/radiation started 10/15/2015  Xeloda placed on hold beginning 11/07/2015 due to  progression of skin rash  Xeloda resumed at a reduced dose of 500 mg twice daily 11/10/2015  Xeloda discontinued 11/19/2015  Radiation completed 11/24/2015  2. Idiopathic interstitial fibrosis of the lungs  3. Pain secondary to #1  4. Weight loss  5. 14 mm enhancing posterior right renal mass on CT 09/21/2015 suspicious for a renal cell carcinoma.  6. Rash on trunk and extremities secondary to Xeloda. 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 Progressive 11/07/2015. Xeloda placed on hold. Resumed at reduced dose 11/10/2015. Discontinued 11/19/2015.   Disposition: Roy English appears stable. He has completed the course of radiation/Xeloda. Restaging scan is scheduled 12/24/2015. He will schedule a follow-up appointment with Dr. Dossie Der at [facility].  The sore throat/erythema may be mucositis related to Xeloda or he may have a viral URI. He will continue symptomatic care.  He will return for a follow-up visit here on 12/25/2015. He will contact the office in the interim with any problems.  Patient seen with Dr. Benay Spice.    Ned Card ANP/GNP-BC   11/27/2015  1:31 PM  This was a shared visit with Ned Card. Roy English was interviewed and examined. He has completed Xeloda/radiation.  We will see him after the restaging CT. I suspect the throat erythema is related to Xeloda-induced mucositis.  Julieanne Manson, M.D.

## 2015-12-01 ENCOUNTER — Telehealth: Payer: Self-pay | Admitting: *Deleted

## 2015-12-01 NOTE — Telephone Encounter (Signed)
  Oncology Nurse Navigator Documentation  Navigator Location: [facility] (12/01/15 1354) Navigator Encounter Type: Telephone (12/01/15 1354) Telephone: Incoming Call;Appt Confirmation/Clarification (12/01/15 1354)  Sister, Roy English called to inquire if Dr. Benay Spice needs to see him on 09/27/15? He has an appointment the same day at 4pm with Dr. Dossie Der at [facility]. Please call with MD answer.

## 2015-12-02 ENCOUNTER — Telehealth: Payer: Self-pay | Admitting: Oncology

## 2015-12-02 ENCOUNTER — Telehealth: Payer: Self-pay | Admitting: *Deleted

## 2015-12-02 ENCOUNTER — Telehealth: Payer: Self-pay | Admitting: Radiation Oncology

## 2015-12-02 NOTE — Telephone Encounter (Signed)
Spoke with patient to confirm r/s 5/25 appt to 6/1 per 5/2 pof

## 2015-12-02 NOTE — Telephone Encounter (Signed)
LM for pt's sister Adria Devon to confirm that I had receiver her message and that we agree with moving forward with Dr. Dossie Der' recommendations.  We have a F/U appt scheduled on 5/31, but can postpone or do a "telemedcine" follow up by phone and/or Mychart if he is having surgery that week. We would like to follow up with him however about 6 months after he recovers from surgery as well. We will schedule this further out once we know his surgical plans. I encouraged Butch Penny to call me back if she has any questions or concerns.

## 2015-12-02 NOTE — Telephone Encounter (Signed)
Oncology Nurse Navigator Documentation  Oncology Nurse Navigator Flowsheets 12/02/2015  Navigator Location [facility]  Navigator Encounter Type Telephone  Telephone Outgoing Call;Appt Confirmation/Clarification  Abnormal Finding Date -  Confirmed Diagnosis Date -  Patient Visit Type -  Treatment Phase -  Barriers/Navigation Needs Coordination of Care--has OV w/Dr. Benay Spice 5/25 and also goes to [facility] same day  Education -  Interventions Coordination of Care-POF to scheduler to move 5/25 visit out a week per MD  Coordination of Care Appts  Education Method -  Support Groups/Services -  Acuity Level 1  Time Spent with Patient 15  Left VM for sister, Butch Penny that his 5/25 visit will be moved out a week or so per MD.

## 2015-12-05 ENCOUNTER — Other Ambulatory Visit: Payer: Self-pay | Admitting: *Deleted

## 2015-12-05 DIAGNOSIS — C250 Malignant neoplasm of head of pancreas: Secondary | ICD-10-CM

## 2015-12-05 MED ORDER — HYDROMORPHONE HCL 2 MG PO TABS: 2.0000 mg | ORAL_TABLET | ORAL | Status: DC | PRN

## 2015-12-05 NOTE — Telephone Encounter (Signed)
Received message that pt is requesting refill on Dilaudid. Verified with wife Dilaudid is effective for pain, denies any new pain. Rx will be left in prescription book for pick up.

## 2015-12-08 ENCOUNTER — Other Ambulatory Visit: Payer: Self-pay | Admitting: *Deleted

## 2015-12-08 DIAGNOSIS — C250 Malignant neoplasm of head of pancreas: Secondary | ICD-10-CM

## 2015-12-08 MED ORDER — HYDROMORPHONE HCL 2 MG PO TABS: 2.0000 mg | ORAL_TABLET | ORAL | Status: DC | PRN

## 2015-12-08 NOTE — Telephone Encounter (Signed)
Pt called and stated that he lost his Dilaudid prescription that he picked up from [facility] on 12/05/15.  Dr. Benay Spice informed and new prescription signed per MD.

## 2015-12-16 ENCOUNTER — Other Ambulatory Visit: Payer: Self-pay | Admitting: *Deleted

## 2015-12-16 DIAGNOSIS — C250 Malignant neoplasm of head of pancreas: Secondary | ICD-10-CM

## 2015-12-16 NOTE — Telephone Encounter (Signed)
Message from pt's wife requesting refill on Hydromorphone to pick up on 12/18/15.

## 2015-12-18 MED ORDER — HYDROMORPHONE HCL 2 MG PO TABS: 2.0000 mg | ORAL_TABLET | ORAL | Status: DC | PRN

## 2015-12-18 NOTE — Telephone Encounter (Signed)
Notified pt that Dilaudid Rx is ready to be picked up. Pt has been requiring refill approximately every 10 days.

## 2015-12-21 ENCOUNTER — Other Ambulatory Visit: Payer: Self-pay | Admitting: Nurse Practitioner

## 2015-12-24 ENCOUNTER — Ambulatory Visit (HOSPITAL_COMMUNITY): Payer: 59

## 2015-12-24 ENCOUNTER — Other Ambulatory Visit: Payer: Self-pay | Admitting: *Deleted

## 2015-12-24 ENCOUNTER — Ambulatory Visit (HOSPITAL_COMMUNITY)
Admission: RE | Admit: 2015-12-24 | Discharge: 2015-12-24 | Disposition: A | Discharge status: Home / Self Care | Payer: 59 | Source: Ambulatory Visit | Attending: Radiation Oncology | Admitting: Radiation Oncology

## 2015-12-24 ENCOUNTER — Other Ambulatory Visit (HOSPITAL_BASED_OUTPATIENT_CLINIC_OR_DEPARTMENT_OTHER): Payer: 59

## 2015-12-24 ENCOUNTER — Encounter (HOSPITAL_COMMUNITY): Payer: Self-pay

## 2015-12-24 DIAGNOSIS — R918 Other nonspecific abnormal finding of lung field: Secondary | ICD-10-CM | POA: Diagnosis not present

## 2015-12-24 DIAGNOSIS — C250 Malignant neoplasm of head of pancreas: Secondary | ICD-10-CM

## 2015-12-24 DIAGNOSIS — C787 Secondary malignant neoplasm of liver and intrahepatic bile duct: Secondary | ICD-10-CM | POA: Insufficient documentation

## 2015-12-24 DIAGNOSIS — C259 Malignant neoplasm of pancreas, unspecified: Secondary | ICD-10-CM

## 2015-12-24 HISTORY — DX: Malignant neoplasm of pancreas, unspecified: C25.9

## 2015-12-24 HISTORY — DX: Malignant neoplasm of pancreas, unspecified: C78.7

## 2015-12-24 LAB — BASIC METABOLIC PANEL
Anion Gap: 9 mEq/L (ref 3–11)
BUN: 7.3 mg/dL (ref 7.0–26.0)
CALCIUM: 9.2 mg/dL (ref 8.4–10.4)
CHLORIDE: 101 meq/L (ref 98–109)
CO2: 29 mEq/L (ref 22–29)
CREATININE: 0.7 mg/dL (ref 0.7–1.3)
EGFR: >90 mL/min/{1.73_m2} (ref >90)
Glucose: 102 mg/dl (ref 70–140)
Potassium: 4 mEq/L (ref 3.5–5.1)
SODIUM: 139 meq/L (ref 136–145)

## 2015-12-24 MED ORDER — IOPAMIDOL (ISOVUE-300) INJECTION 61%
100.0000 mL | Freq: Once | INTRAVENOUS | Status: AC | PRN
  Administered 2015-12-24: 100 mL via INTRAVENOUS

## 2015-12-25 ENCOUNTER — Ambulatory Visit: Payer: 59 | Admitting: Oncology

## 2015-12-25 LAB — CANCER ANTIGEN 19-9

## 2015-12-30 ENCOUNTER — Other Ambulatory Visit: Payer: Self-pay | Admitting: *Deleted

## 2015-12-30 ENCOUNTER — Encounter: Payer: Self-pay | Admitting: Radiation Oncology

## 2015-12-30 DIAGNOSIS — C250 Malignant neoplasm of head of pancreas: Secondary | ICD-10-CM

## 2015-12-30 NOTE — Telephone Encounter (Signed)
Message from pt's wife requesting to pick up Dilaudid refill on 5/31.

## 2015-12-31 ENCOUNTER — Ambulatory Visit
Admission: RE | Admit: 2015-12-31 | Discharge: 2015-12-31 | Disposition: A | Discharge status: Home / Self Care | Payer: 59 | Source: Ambulatory Visit | Attending: Radiation Oncology | Admitting: Radiation Oncology

## 2015-12-31 ENCOUNTER — Encounter: Payer: Self-pay | Admitting: Radiation Oncology

## 2015-12-31 VITALS — BP 107/71 | HR 69 | Temp 97.8°F | Resp 20 | Ht 69.0 in | Wt 146.1 lb

## 2015-12-31 DIAGNOSIS — C250 Malignant neoplasm of head of pancreas: Secondary | ICD-10-CM

## 2015-12-31 HISTORY — DX: Personal history of irradiation: Z92.3

## 2015-12-31 MED ORDER — HYDROMORPHONE HCL 2 MG PO TABS: 2.0000 mg | ORAL_TABLET | ORAL | Status: DC | PRN

## 2015-12-31 NOTE — Progress Notes (Signed)
Radiation Oncology         [phone] ________________________________  Name: Roy English MRN: [id number]  Date: 12/31/2015  DOB: 1950-10-21  Follow-Up Visit Note  CC: Golden Pop, MD  Guadalupe Maple, MD  Diagnosis: Pancreatic cancer, now with liver metastases  Interval Since Last Radiation:  1 month   Narrative:  Follow up s/p radiation to pancreas, dilaudid taken 1 hour ago, pain controlled with dilaudid and has tapered dosage recently , drinks boost, 1-4 cans daily, here to discuss CT chest results 12/24/15, knows progression of tumor, takes ativan,zofran or compazine for nausea, prn, gets SOB easily also, eats 3 meals a day, and drinks 1/2 beer every 3-4 days now, drinks protein shakes.   Patient states his energy is low at times but once he gets his day started his energy improves.   His recent 5/42/17 CT revealed an increase in size of the primary lesion within the head of pancreas. There is now involvement of the portal vein at the level the portal venous confluence as well as the superior mesenteric artery. In addition, development of multifocal liver metastases.                               ALLERGIES:  is allergic to penicillins.  Meds: Current Outpatient Prescriptions  Medication Sig Dispense Refill  . HYDROmorphone (DILAUDID) 2 MG tablet Take 1-2 tablets (2-4 mg total) by mouth every 4 (four) hours as needed for severe pain. 100 tablet 0  . lactose free nutrition (BOOST PLUS) LIQD Take 237 mLs by mouth 2 (two) times daily between meals. 60 Can 0  . LORazepam (ATIVAN) 0.5 MG tablet Take 1 tablet (0.5 mg total) by mouth every 4 (four) hours as needed for anxiety (or nausea). 90 tablet 3  . ondansetron (ZOFRAN) 8 MG tablet Take 1 tablet (8 mg total) by mouth every 6 (six) hours as needed for nausea or vomiting. 30 tablet 3  . Pancrelipase, Lip-Prot-Amyl, 3000-9500 units CPEP Take 1 capsule by mouth 3 (three) times daily with meals.     . polyethylene glycol (MIRALAX /  GLYCOLAX) packet Take 17 g by mouth daily.    . prochlorperazine (COMPAZINE) 10 MG tablet TAKE 1 TABLET BY MOUTH EVERY 6 HOURS AS NEEDED FOR NAUSEA OR VOMITING 30 tablet 1  . senna-docusate (SENNA S) 8.6-50 MG tablet Take 1 tablet by mouth 2 (two) times daily. (Patient not taking: Reported on 11/17/2015) 60 tablet 0  . sildenafil (VIAGRA) 100 MG tablet Take 1 tablet (100 mg total) by mouth daily as needed for erectile dysfunction. (Patient not taking: Reported on 11/17/2015) 10 tablet 12   No current facility-administered medications for this encounter.    Physical Findings: The patient is in no acute distress. Patient is alert and oriented.  height is 5\' 9"  (1.753 m) and weight is 146 lb 1.6 oz (66.271 kg). His oral temperature is 97.8 F (36.6 C). His blood pressure is 107/71 and his pulse is 69. His respiration is 20 and oxygen saturation is 100%. .   No palpable cervical, supraclavicular or axillary lymphoadenopathy. The heart has a regular rate and rhythm. The lungs are clear to auscultation.   Lab Findings: Lab Results  Component Value Date   WBC 4.4 11/19/2015   HGB 14.5 11/19/2015   HCT 42.1 11/19/2015   MCV 89.4 11/19/2015   PLT 142 11/19/2015     Radiographic Findings: Ct Chest W Contrast  12/24/2015  CLINICAL DATA:  Followup pancreas cancer EXAM: CT CHEST, ABDOMEN, AND PELVIS WITH CONTRAST TECHNIQUE: Multidetector CT imaging of the chest, abdomen and pelvis was performed following the standard protocol during bolus administration of intravenous contrast. CONTRAST:  137mL ISOVUE-300 IOPAMIDOL (ISOVUE-300) INJECTION 61% COMPARISON:  CT chest from 07/28/2012 and CT abdomen and pelvis from 09/21/2015 FINDINGS: CT CHEST Mediastinum: The heart size appears moderately enlarged. Aortic atherosclerosis scratch sec calcification involving the RCA and LAD coronary artery noted. The trachea is patent and midline. Normal appearance of the esophagus. No mediastinal or hilar adenopathy. No  supraclavicular or axillary adenopathy. Lungs/Pleura: No pleural fluid. 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 Musculoskeletal: No aggressive lytic or sclerotic bone lesions identified. Degenerative disc disease is identified within the thoracic spine. CT ABDOMEN AND PELVIS Hepatobiliary: Numerous liver metastases are identified and appear new from the previous examination. Index lesion within the dome of liver measures 2.5 by 2.1 cm, image 47 of series 4. Index lesion within inferior right lobe of liver measures 2.7 x 2.2 cm, image 63 of series 4. In the left lobe of liver there is an index lesion which measures 1 x 1.2 cm, image 52 of series 4. Pneumobilia is present compatible with biliary patency. Gas is noted within the gallbladder. There is a stent within the common bile duct. Pancreas: Mass involving the head of pancreas measures 3.4 by 3.1 cm, image 67 of series 4. On the previous examination this mass 3 x 2.5 cm. This partially encases the portal vein at the portal venous confluence as well as the superior mesenteric artery at the same level, image 66 of series 4. There is resultant decrease in caliber of these vessels when compared with the previous exam. Spleen: The spleen appears normal. Adrenals/Urinary Tract: The adrenal glands are normal. Normal appearance of the kidneys. Stomach/Bowel: The stomach is normal. The small bowel loops have a normal course and caliber. No obstruction. No pathologic dilatation of the colon. Vascular/Lymphatic: Calcified atherosclerotic disease involves the abdominal aorta. No aneurysm. No enlarged retroperitoneal or mesenteric adenopathy. No enlarged pelvic or inguinal lymph nodes. Reproductive: Prostate gland appears enlarged. Other: There is no ascites or focal fluid collections within the abdomen or  pelvis. No peritoneal nodularity identified. Musculoskeletal: There is no aggressive lytic or sclerotic bone lesions identified. IMPRESSION: 1. Interval progression of disease. 2. Increase in size of primary lesion within head of pancreas. There is now involvement of the portal vein at the level the portal venous confluence as well as the superior mesenteric artery. 3. Development of multifocal liver metastases. 4. Pulmonary parenchymal changes compatible with chronic interstitial lung disease. Electronically Signed   By: Kerby Moors M.D.   On: 12/24/2015 16:15   Ct Abdomen Pelvis W Contrast  12/24/2015  CLINICAL DATA:  Followup pancreas cancer EXAM: CT CHEST, ABDOMEN, AND PELVIS WITH CONTRAST TECHNIQUE: Multidetector CT imaging of the chest, abdomen and pelvis was performed following the standard protocol during bolus administration of intravenous contrast. CONTRAST:  110mL ISOVUE-300 IOPAMIDOL (ISOVUE-300) INJECTION 61% COMPARISON:  CT chest from 07/28/2012 and CT abdomen and pelvis from 09/21/2015 FINDINGS: CT CHEST Mediastinum: The heart size appears moderately enlarged. Aortic atherosclerosis scratch sec calcification involving the RCA and LAD coronary artery noted. The trachea is patent and midline. Normal appearance of the esophagus. No mediastinal or hilar adenopathy. No supraclavicular or axillary adenopathy. Lungs/Pleura: No pleural fluid. 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 Musculoskeletal: No aggressive lytic or sclerotic bone lesions identified. Degenerative disc disease is identified within the thoracic spine. CT ABDOMEN AND PELVIS Hepatobiliary: Numerous liver metastases are identified and appear new from the previous examination. Index lesion within the dome of liver measures 2.5 by 2.1 cm, image 47 of series 4. Index lesion  within inferior right lobe of liver measures 2.7 x 2.2 cm, image 63 of series 4. In the left lobe of liver there is an index lesion which measures 1 x 1.2 cm, image 52 of series 4. Pneumobilia is present compatible with biliary patency. Gas is noted within the gallbladder. There is a stent within the common bile duct. Pancreas: Mass involving the head of pancreas measures 3.4 by 3.1 cm, image 67 of series 4. On the previous examination this mass 3 x 2.5 cm. This partially encases the portal vein at the portal venous confluence as well as the superior mesenteric artery at the same level, image 66 of series 4. There is resultant decrease in caliber of these vessels when compared with the previous exam. Spleen: The spleen appears normal. Adrenals/Urinary Tract: The adrenal glands are normal. Normal appearance of the kidneys. Stomach/Bowel: The stomach is normal. The small bowel loops have a normal course and caliber. No obstruction. No pathologic dilatation of the colon. Vascular/Lymphatic: Calcified atherosclerotic disease involves the abdominal aorta. No aneurysm. No enlarged retroperitoneal or mesenteric adenopathy. No enlarged pelvic or inguinal lymph nodes. Reproductive: Prostate gland appears enlarged. Other: There is no ascites or focal fluid collections within the abdomen or pelvis. No peritoneal nodularity identified. Musculoskeletal: There is no aggressive lytic or sclerotic bone lesions identified. IMPRESSION: 1. Interval progression of disease. 2. Increase in size of primary lesion within head of pancreas. There is now involvement of the portal vein at the level the portal venous confluence as well as the superior mesenteric artery. 3. Development of multifocal liver metastases. 4. Pulmonary parenchymal changes compatible with chronic interstitial lung disease. Electronically Signed   By: Kerby Moors M.D.   On: 12/24/2015 16:15    Impression:  The patient has been diagnosed with pancreatic cancer now  with liver metastases. His most recent CT has shown evidence of progression in the primary lesion within the head of the pancreas. In addition, there has been development of multifocal liver metastases. The patient's pain is managed well with his current course of medication, his appetite is stable. Considering the extent of his metastatic disease, I do not see a role for radiation treatment in the patient's care at this time.   Plan: The patient is scheduled to meet with Dr. Benay Spice. He will also continue follow up with his physicians at [facility].  Follow up in RadOnc PRN.   ------------------------------------------------  Jodelle Gross, MD, PhD  This document serves as a record of services personally performed by Kyung Rudd, MD. It was created on his behalf by Derek Mound, a trained medical scribe. The creation of this record is based on the scribe's personal observations and the provider's statements to them. This document has been checked and approved by the attending provider.

## 2015-12-31 NOTE — Progress Notes (Addendum)
Follow up s/p radiation to pancreas, dilaudid taken 1 hour ago, pain controlled with dilaudid, drinks boost, 1-4 cans daily, here to discuss Ct chest results 12/24/15, knows progression of tumor, , energy level very low sated, takes ativan,zofran or compazine for nausea, prn, gets sob easily also, eats 3 meals a day, and drinks 1/2 beer every 3-4 days now, drinks protein shakes,  There were no vitals taken for this visit.  Wt Readings from Last 3 Encounters:  11/27/15 144 lb 12.8 oz (65.681 kg)  11/21/15 146 lb 4.8 oz (66.361 kg)  11/19/15 147 lb 6.4 oz (66.86 kg)  BP 107/71 mmHg  Pulse 69  Temp(Src) 97.8 F (36.6 C) (Oral)  Resp 20  Ht 5\' 9"  (1.753 m)  Wt 146 lb 1.6 oz (66.271 kg)  BMI 21.57 kg/m2  SpO2 100%

## 2016-01-01 ENCOUNTER — Telehealth: Payer: Self-pay | Admitting: *Deleted

## 2016-01-01 ENCOUNTER — Other Ambulatory Visit: Payer: Self-pay | Admitting: Nurse Practitioner

## 2016-01-01 ENCOUNTER — Ambulatory Visit (HOSPITAL_BASED_OUTPATIENT_CLINIC_OR_DEPARTMENT_OTHER): Payer: 59 | Admitting: Oncology

## 2016-01-01 ENCOUNTER — Telehealth: Payer: Self-pay | Admitting: Oncology

## 2016-01-01 VITALS — BP 114/69 | HR 84 | Temp 97.9°F | Resp 18 | Ht 69.0 in | Wt 145.3 lb

## 2016-01-01 DIAGNOSIS — G893 Neoplasm related pain (acute) (chronic): Secondary | ICD-10-CM

## 2016-01-01 DIAGNOSIS — C250 Malignant neoplasm of head of pancreas: Secondary | ICD-10-CM

## 2016-01-01 DIAGNOSIS — C787 Secondary malignant neoplasm of liver and intrahepatic bile duct: Secondary | ICD-10-CM

## 2016-01-01 NOTE — Progress Notes (Signed)
[place] OFFICE PROGRESS NOTE   Diagnosis: Pancreas cancer  INTERVAL HISTORY:   Mr. Fraleigh returns as scheduled. He completed Xeloda/radiation 11/24/2015 He reports improvement in the abdominal pain. He continues Dilaudid for pain. Good appetite. He is working.  He underwent a restaging CT on 12/24/2015. This confirmed multiple liver metastases. The mass in the head of the pancreas is slightly larger and partially encases the portal vein and the superior mesenteric artery. No ascites or peritoneal nodularity.  He discussed the CT findings with Dr. Dossie Der. He has been referred to [facility] for a medical oncology opinion.  Objective:  Vital signs in last 24 hours:  Blood pressure 114/69, pulse 84, temperature 97.9 F (36.6 C), temperature source Oral, resp. rate 18, height 5\' 9"  (1.753 m), weight 145 lb 4.8 oz (65.908 kg), SpO2 98 %.    Resp: Lungs clear bilaterally Cardio: Regular rate and rhythm GI: No hepatomegaly, no mass, nontender Vascular: No leg edema  Skin: Resolving erythema at the mid back     Lab Results:  Lab Results  Component Value Date   WBC 4.4 11/19/2015   HGB 14.5 11/19/2015   HCT 42.1 11/19/2015   MCV 89.4 11/19/2015   PLT 142 11/19/2015   NEUTROABS 3.1 11/19/2015    CA 19-9 on 12/24/2015-2452  Medications: I have reviewed the patient's current medications.  Assessment/Plan: 1. Pancreas cancer, clinical T3 N0, pancreas head mass, status post an EUS biopsy at [facility] on 09/17/2015 confirming a poorly differentiate a carcinoma  ultrasound staging 09/17/2015, T2 N0  CT chest at [facility] 09/29/2015 with patchy groundglass opacities throughout both lungs, nonspecific 5 mm right lung nodule  MRI liver 09/07/2015 revealed a pancreas head mass with bile duct obstruction, no focal liver lesions, no lymphadenopathy  CT abdomen/pelvis 09/21/2015-pancreas head mass with intra-and extrahepatic bile duct dilation despite placement of a stent,  suspicion for SMV involvement  Xeloda/radiation started 10/15/2015  Xeloda placed on hold beginning 11/07/2015 due to progression of skin rash  Xeloda resumed at a reduced dose of 500 mg twice daily 11/10/2015  Xeloda discontinued 11/19/2015  Radiation completed 11/24/2015  Restaging CTs 12/24/2015 with enlargement of the pancreas head mass and development of liver metastases  2. Idiopathic interstitial fibrosis of the lungs  3. Pain secondary to #1  4. Weight loss  5. 14 mm enhancing posterior right renal mass on CT 09/21/2015 suspicious for a renal cell carcinoma.  6. Rash on trunk and extremities secondary to Xeloda. 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 Progressive 11/07/2015. Xeloda placed on hold. Resumed at reduced dose 11/10/2015. Discontinued 11/19/2015.    Disposition:  Roy English has pancreas cancer. He completed a course of Xeloda/radiation on 11/24/2015. The restaging CT 12/24/2015 reveals progressive disease with development of liver metastases. I discussed the CT findings and treatment options with Mr. Mehra and his family.  We discussed the prognosis. He understands no therapy will be curative. He appears to be a candidate for salvage systemic therapy. We discussed gemcitabine/Abraxane, FOLFIRINOX, and single agent gemcitabine. I recommend gemcitabine/Abraxane.  We reviewed the potential toxicities associated with this regimen including the chance for nausea, alopecia, neuropathy, and hematologic toxicity. We discussed the fever, rash, and pneumonitis associated with gemcitabine.  Mr. Outwater will see Dr.Blobe on 01/06/2016 for a second medical oncology opinion. I will refer him for placement of a Port-A-Cath in anticipation of beginning chemotherapy when he returns for an office visit on 01/09/2016.  The plan is to treat him with  standard chemotherapy here unless he is eligible for a clinical trial at [facility].  Betsy Coder, MD  01/01/2016    5:27 PM

## 2016-01-01 NOTE — Telephone Encounter (Signed)
Gave and printed appt sched and avs for pt for June °

## 2016-01-01 NOTE — Patient Instructions (Signed)
Nanoparticle Albumin-Bound Paclitaxel injection  What is this medicine?  NANOPARTICLE ALBUMIN-BOUND PACLITAXEL (Na no PAHR ti kuhl al BYOO muhn-bound PAK li TAX el) is a chemotherapy drug. It targets fast dividing cells, like cancer cells, and causes these cells to die. This medicine is used to treat advanced breast cancer and advanced lung cancer.  This medicine may be used for other purposes; ask your health care provider or pharmacist if you have questions.  What should I tell my health care provider before I take this medicine?  They need to know if you have any of these conditions:  -kidney disease  -liver disease  -low blood counts, like low platelets, red blood cells, or white blood cells  -recent or ongoing radiation therapy  -an unusual or allergic reaction to paclitaxel, albumin, other chemotherapy, other medicines, foods, dyes, or preservatives  -pregnant or trying to get pregnant  -breast-feeding  How should I use this medicine?  This drug is given as an infusion into a vein. It is administered in a hospital or clinic by a specially trained health care professional.  Talk to your pediatrician regarding the use of this medicine in children. Special care may be needed.  Overdosage: If you think you have taken too much of this medicine contact a poison control center or emergency room at once.  NOTE: This medicine is only for you. Do not share this medicine with others.  What if I miss a dose?  It is important not to miss your dose. Call your doctor or health care professional if you are unable to keep an appointment.  What may interact with this medicine?  -cyclosporine  -diazepam  -ketoconazole  -medicines to increase blood counts like filgrastim, pegfilgrastim, sargramostim  -other chemotherapy drugs like cisplatin, doxorubicin, epirubicin, etoposide, teniposide, vincristine  -quinidine  -testosterone  -vaccines  -verapamil  Talk to your doctor or health care professional before taking any of these  medicines:  -acetaminophen  -aspirin  -ibuprofen  -ketoprofen  -naproxen  This list may not describe all possible interactions. Give your health care provider a list of all the medicines, herbs, non-prescription drugs, or dietary supplements you use. Also tell them if you smoke, drink alcohol, or use illegal drugs. Some items may interact with your medicine.  What should I watch for while using this medicine?  Your condition will be monitored carefully while you are receiving this medicine. You will need important blood work done while you are taking this medicine.  This drug may make you feel generally unwell. This is not uncommon, as chemotherapy can affect healthy cells as well as cancer cells. Report any side effects. Continue your course of treatment even though you feel ill unless your doctor tells you to stop.  In some cases, you may be given additional medicines to help with side effects. Follow all directions for their use.  Call your doctor or health care professional for advice if you get a fever, chills or sore throat, or other symptoms of a cold or flu. Do not treat yourself. This drug decreases your body's ability to fight infections. Try to avoid being around people who are sick.  This medicine may increase your risk to bruise or bleed. Call your doctor or health care professional if you notice any unusual bleeding.  Be careful brushing and flossing your teeth or using a toothpick because you may get an infection or bleed more easily. If you have any dental work done, tell your dentist you are receiving this   medicine.  Avoid taking products that contain aspirin, acetaminophen, ibuprofen, naproxen, or ketoprofen unless instructed by your doctor. These medicines may hide a fever.  Do not become pregnant while taking this medicine. Women should inform their doctor if they wish to become pregnant or think they might be pregnant. There is a potential for serious side effects to an unborn child. Talk to  your health care professional or pharmacist for more information. Do not breast-feed an infant while taking this medicine.  Men are advised not to father a child while receiving this medicine.  What side effects may I notice from receiving this medicine?  Side effects that you should report to your doctor or health care professional as soon as possible:  -allergic reactions like skin rash, itching or hives, swelling of the face, lips, or tongue  -low blood counts - This drug may decrease the number of white blood cells, red blood cells and platelets. You may be at increased risk for infections and bleeding.  -signs of infection - fever or chills, cough, sore throat, pain or difficulty passing urine  -signs of decreased platelets or bleeding - bruising, pinpoint red spots on the skin, black, tarry stools, nosebleeds  -signs of decreased red blood cells - unusually weak or tired, fainting spells, lightheadedness  -breathing problems  -changes in vision  -chest pain  -high or low blood pressure  -mouth sores  -nausea and vomiting  -pain, swelling, redness or irritation at the injection site  -pain, tingling, numbness in the hands or feet  -slow or irregular heartbeat  -swelling of the ankle, feet, hands  Side effects that usually do not require medical attention (report to your doctor or health care professional if they continue or are bothersome):  -aches, pains  -changes in the color of fingernails  -diarrhea  -hair loss  -loss of appetite  This list may not describe all possible side effects. Call your doctor for medical advice about side effects. You may report side effects to FDA at 1-800-FDA-1088.  Where should I keep my medicine?  This drug is given in a hospital or clinic and will not be stored at home.  NOTE: This sheet is a summary. It may not cover all possible information. If you have questions about this medicine, talk to your doctor, pharmacist, or health care provider.      2016, [employer].  (2012-09-11 16:48:50)  Gemcitabine injection  What is this medicine?  GEMCITABINE (jem SIT a been) is a chemotherapy drug. This medicine is used to treat many types of cancer like breast cancer, lung cancer, pancreatic cancer, and ovarian cancer.  This medicine may be used for other purposes; ask your health care provider or pharmacist if you have questions.  What should I tell my health care provider before I take this medicine?  They need to know if you have any of these conditions:  -blood disorders  -infection  -kidney disease  -liver disease  -recent or ongoing radiation therapy  -an unusual or allergic reaction to gemcitabine, other chemotherapy, other medicines, foods, dyes, or preservatives  -pregnant or trying to get pregnant  -breast-feeding  How should I use this medicine?  This drug is given as an infusion into a vein. It is administered in a hospital or clinic by a specially trained health care professional.  Talk to your pediatrician regarding the use of this medicine in children. Special care may be needed.  Overdosage: If you think you have taken too much of this   medicine contact a poison control center or emergency room at once.  NOTE: This medicine is only for you. Do not share this medicine with others.  What if I miss a dose?  It is important not to miss your dose. Call your doctor or health care professional if you are unable to keep an appointment.  What may interact with this medicine?  -medicines to increase blood counts like filgrastim, pegfilgrastim, sargramostim  -some other chemotherapy drugs like cisplatin  -vaccines  Talk to your doctor or health care professional before taking any of these medicines:  -acetaminophen  -aspirin  -ibuprofen  -ketoprofen  -naproxen  This list may not describe all possible interactions. Give your health care provider a list of all the medicines, herbs, non-prescription drugs, or dietary supplements you use. Also tell them if you smoke, drink alcohol, or use  illegal drugs. Some items may interact with your medicine.  What should I watch for while using this medicine?  Visit your doctor for checks on your progress. This drug may make you feel generally unwell. This is not uncommon, as chemotherapy can affect healthy cells as well as cancer cells. Report any side effects. Continue your course of treatment even though you feel ill unless your doctor tells you to stop.  In some cases, you may be given additional medicines to help with side effects. Follow all directions for their use.  Call your doctor or health care professional for advice if you get a fever, chills or sore throat, or other symptoms of a cold or flu. Do not treat yourself. This drug decreases your body's ability to fight infections. Try to avoid being around people who are sick.  This medicine may increase your risk to bruise or bleed. Call your doctor or health care professional if you notice any unusual bleeding.  Be careful brushing and flossing your teeth or using a toothpick because you may get an infection or bleed more easily. If you have any dental work done, tell your dentist you are receiving this medicine.  Avoid taking products that contain aspirin, acetaminophen, ibuprofen, naproxen, or ketoprofen unless instructed by your doctor. These medicines may hide a fever.  Women should inform their doctor if they wish to become pregnant or think they might be pregnant. There is a potential for serious side effects to an unborn child. Talk to your health care professional or pharmacist for more information. Do not breast-feed an infant while taking this medicine.  What side effects may I notice from receiving this medicine?  Side effects that you should report to your doctor or health care professional as soon as possible:  -allergic reactions like skin rash, itching or hives, swelling of the face, lips, or tongue  -low blood counts - this medicine may decrease the number of white blood cells, red  blood cells and platelets. You may be at increased risk for infections and bleeding.  -signs of infection - fever or chills, cough, sore throat, pain or difficulty passing urine  -signs of decreased platelets or bleeding - bruising, pinpoint red spots on the skin, black, tarry stools, blood in the urine  -signs of decreased red blood cells - unusually weak or tired, fainting spells, lightheadedness  -breathing problems  -chest pain  -mouth sores  -nausea and vomiting  -pain, swelling, redness at site where injected  -pain, tingling, numbness in the hands or feet  -stomach pain  -swelling of ankles, feet, hands  -unusual bleeding  Side effects that usually do

## 2016-01-01 NOTE — Progress Notes (Signed)
Application for handicap parking given to patient per Dr. Benay Spice.

## 2016-01-01 NOTE — Telephone Encounter (Signed)
Request compazine refill from spouse.  "He's out and the pharmacy said they will have it Monday.  Can we pick up prescription today?" Order was sent eRx 12-22-2015.  Called Pharmacy leaving message on voicemail awaiting opening to refill order that was sent on 12-22-2015.

## 2016-01-04 ENCOUNTER — Other Ambulatory Visit: Payer: Self-pay | Admitting: Oncology

## 2016-01-05 ENCOUNTER — Other Ambulatory Visit: Payer: Self-pay | Admitting: Radiology

## 2016-01-06 ENCOUNTER — Other Ambulatory Visit: Payer: Self-pay | Admitting: Radiology

## 2016-01-07 ENCOUNTER — Other Ambulatory Visit: Payer: Self-pay | Admitting: Oncology

## 2016-01-07 ENCOUNTER — Ambulatory Visit (HOSPITAL_COMMUNITY)
Admission: RE | Admit: 2016-01-07 | Discharge: 2016-01-07 | Disposition: A | Discharge status: Home / Self Care | Payer: 59 | Source: Ambulatory Visit | Attending: Oncology | Admitting: Oncology

## 2016-01-07 ENCOUNTER — Encounter (HOSPITAL_COMMUNITY): Payer: Self-pay

## 2016-01-07 ENCOUNTER — Other Ambulatory Visit: Payer: 59

## 2016-01-07 DIAGNOSIS — M1990 Unspecified osteoarthritis, unspecified site: Secondary | ICD-10-CM | POA: Insufficient documentation

## 2016-01-07 DIAGNOSIS — C787 Secondary malignant neoplasm of liver and intrahepatic bile duct: Secondary | ICD-10-CM | POA: Diagnosis not present

## 2016-01-07 DIAGNOSIS — Z923 Personal history of irradiation: Secondary | ICD-10-CM | POA: Diagnosis not present

## 2016-01-07 DIAGNOSIS — C259 Malignant neoplasm of pancreas, unspecified: Secondary | ICD-10-CM | POA: Insufficient documentation

## 2016-01-07 DIAGNOSIS — C250 Malignant neoplasm of head of pancreas: Secondary | ICD-10-CM

## 2016-01-07 DIAGNOSIS — Z880 Allergy status to penicillin: Secondary | ICD-10-CM | POA: Diagnosis not present

## 2016-01-07 DIAGNOSIS — Z9221 Personal history of antineoplastic chemotherapy: Secondary | ICD-10-CM | POA: Insufficient documentation

## 2016-01-07 DIAGNOSIS — J84112 Idiopathic pulmonary fibrosis: Secondary | ICD-10-CM | POA: Diagnosis not present

## 2016-01-07 LAB — COMPREHENSIVE METABOLIC PANEL
ALT: 13 U/L — AB (ref 17–63)
AST: 19 U/L (ref 15–41)
Albumin: 3.7 g/dL (ref 3.5–5.0)
Alkaline Phosphatase: 146 U/L — ABNORMAL HIGH (ref 38–126)
Anion gap: 7 (ref 5–15)
BUN: 6 mg/dL (ref 6–20)
CALCIUM: 8.5 mg/dL — AB (ref 8.9–10.3)
CO2: 27 mmol/L (ref 22–32)
CREATININE: 0.54 mg/dL — AB (ref 0.61–1.24)
Chloride: 105 mmol/L (ref 101–111)
GFR calc non Af Amer: >60 mL/min (ref >60)
GLUCOSE: 84 mg/dL (ref 65–99)
Potassium: 4 mmol/L (ref 3.5–5.1)
Sodium: 139 mmol/L (ref 135–145)
Total Bilirubin: 0.5 mg/dL (ref 0.3–1.2)
Total Protein: 6.6 g/dL (ref 6.5–8.1)

## 2016-01-07 LAB — CBC WITH DIFFERENTIAL/PLATELET
BASOS ABS: 0 10*3/uL (ref 0.0–0.1)
BASOS PCT: 0 %
EOS ABS: 0.4 10*3/uL (ref 0.0–0.7)
Eosinophils Relative: 7 %
HCT: 41.5 % (ref 39.0–52.0)
HEMOGLOBIN: 13.9 g/dL (ref 13.0–17.0)
LYMPHS ABS: 1 10*3/uL (ref 0.7–4.0)
Lymphocytes Relative: 16 %
MCH: 30.3 pg (ref 26.0–34.0)
MCHC: 33.5 g/dL (ref 30.0–36.0)
MCV: 90.4 fL (ref 78.0–100.0)
Monocytes Absolute: 0.8 10*3/uL (ref 0.1–1.0)
Monocytes Relative: 13 %
NEUTROS PCT: 64 %
Neutro Abs: 3.8 10*3/uL (ref 1.7–7.7)
PLATELETS: 180 10*3/uL (ref 150–400)
RBC: 4.59 MIL/uL (ref 4.22–5.81)
RDW: 13.1 % (ref 11.5–15.5)
WBC: 6 10*3/uL (ref 4.0–10.5)

## 2016-01-07 LAB — PROTIME-INR
INR: 1.08 (ref 0.00–1.49)
PROTHROMBIN TIME: 14.2 s (ref 11.6–15.2)

## 2016-01-07 MED ORDER — LIDOCAINE HCL 1 % IJ SOLN
INTRAMUSCULAR | Status: AC | PRN
  Administered 2016-01-07: 10 mL via INTRADERMAL

## 2016-01-07 MED ORDER — VANCOMYCIN HCL IN DEXTROSE 1-5 GM/200ML-% IV SOLN
1000.0000 mg | INTRAVENOUS | Status: AC
  Administered 2016-01-07: 1000 mg via INTRAVENOUS
  Filled 2016-01-07: qty 200

## 2016-01-07 MED ORDER — FENTANYL CITRATE (PF) 100 MCG/2ML IJ SOLN
INTRAMUSCULAR | Status: AC
  Filled 2016-01-07: qty 2

## 2016-01-07 MED ORDER — SODIUM CHLORIDE 0.9 % IV SOLN: INTRAVENOUS | Status: DC

## 2016-01-07 MED ORDER — FENTANYL CITRATE (PF) 100 MCG/2ML IJ SOLN
INTRAMUSCULAR | Status: AC | PRN
  Administered 2016-01-07: 50 ug via INTRAVENOUS

## 2016-01-07 MED ORDER — HEPARIN SOD (PORK) LOCK FLUSH 100 UNIT/ML IV SOLN
INTRAVENOUS | Status: AC | PRN
  Administered 2016-01-07: 500 [IU]

## 2016-01-07 MED ORDER — HEPARIN SOD (PORK) LOCK FLUSH 100 UNIT/ML IV SOLN
INTRAVENOUS | Status: AC
  Filled 2016-01-07: qty 5

## 2016-01-07 MED ORDER — LIDOCAINE HCL 1 % IJ SOLN
INTRAMUSCULAR | Status: AC
  Filled 2016-01-07: qty 20

## 2016-01-07 MED ORDER — MIDAZOLAM HCL 2 MG/2ML IJ SOLN
INTRAMUSCULAR | Status: AC
  Filled 2016-01-07: qty 4

## 2016-01-07 MED ORDER — MIDAZOLAM HCL 2 MG/2ML IJ SOLN
INTRAMUSCULAR | Status: AC | PRN
  Administered 2016-01-07: 1 mg via INTRAVENOUS
  Administered 2016-01-07 (×2): 0.5 mg via INTRAVENOUS

## 2016-01-07 NOTE — Sedation Documentation (Signed)
Patient denies pain and is resting comfortably.  

## 2016-01-07 NOTE — Procedures (Signed)
RIJV PAC SVC RA No comp/EBL 

## 2016-01-07 NOTE — Consult Note (Signed)
Chief Complaint: Patient was seen in consultation today for Port-A-Cath placement  Referring Physician(s): Sherrill,Gary B  Supervising Physician: Marybelle Killings  Patient Status: Out-pt  History of Present Illness: Roy English is a 65 y.o. male with history of pancreatic cancer, initially diagnosed at [facility] in February 2017. He is status post chemoradiation. Recent follow-up CT has shown progressive disease and he presents today for Port-A-Cath placement for additional chemotherapy.  Past Medical History  Diagnosis Date  . Environmental allergies   . Arthritis   . ED (erectile dysfunction)   . Actinic keratosis   . 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 Dilated cbd, acquired   . Elevated LFTs   . S/P radiation therapy 11/24/15 completed     pancreas 50.4Gy    Past Surgical History  Procedure Laterality Date  . Multiple surgeries      ribs,arms,nose,fingers  . Ac separation surgery Bilateral   . Knee arthroscopy Right   . Carpal tunnel surgery Bilateral   . Skin cancer removal       face and chest  . Ercp N/A 09/08/2015    Procedure: ENDOSCOPIC RETROGRADE CHOLANGIOPANCREATOGRAPHY (ERCP);  Surgeon: Gatha Mayer, MD;  Location: [facility];  Service: Endoscopy;  Laterality: N/A;  . Ercp N/A 09/23/2015    Procedure: ENDOSCOPIC RETROGRADE CHOLANGIOPANCREATOGRAPHY (ERCP);  Surgeon: Milus Banister, MD;  Location: [facility];  Service: Endoscopy;  Laterality: N/A;  with stent exchange  . Biliary stent placement N/A 09/23/2015    Procedure: BILIARY STENT PLACEMENT;  Surgeon: Milus Banister, MD;  Location: [facility];  Service: Endoscopy;  Laterality: N/A;    Allergies: Penicillins  Medications: Prior to Admission medications   Medication Sig Start Date End Date Taking? Authorizing Provider  HYDROmorphone (DILAUDID) 2 MG tablet Take 1-2 tablets (2-4 mg total) by mouth every 4 (four) hours as needed for severe pain. 12/31/15  Yes Ladell Pier, MD    lactose free nutrition (BOOST PLUS) LIQD Take 237 mLs by mouth 2 (two) times daily between meals. 09/24/15  Yes Debbe Odea, MD  LORazepam (ATIVAN) 0.5 MG tablet Take 1 tablet (0.5 mg total) by mouth every 4 (four) hours as needed for anxiety (or nausea). 11/07/15  Yes Hayden Pedro, PA-C  ondansetron (ZOFRAN) 8 MG tablet Take 1 tablet (8 mg total) by mouth every 6 (six) hours as needed for nausea or vomiting. 10/17/15  Yes Ladell Pier, MD  Pancrelipase, Lip-Prot-Amyl, 3000-9500 units CPEP Take 1 capsule by mouth 3 (three) times daily with meals.  09/29/15 09/28/16 Yes Historical Provider, MD  polyethylene glycol (MIRALAX / GLYCOLAX) packet Take 17 g by mouth daily.   Yes Historical Provider, MD  prochlorperazine (COMPAZINE) 10 MG tablet TAKE 1 TABLET BY MOUTH EVERY 6 HOURS AS NEEDED FOR NAUSEA OR VOMITING 12/22/15  Yes Ladell Pier, MD  senna-docusate (SENNA S) 8.6-50 MG tablet Take 1 tablet by mouth 2 (two) times daily. Patient not taking: Reported on 11/17/2015 10/03/15   Ladell Pier, MD  sildenafil (VIAGRA) 100 MG tablet Take 1 tablet (100 mg total) by mouth daily as needed for erectile dysfunction. Patient not taking: Reported on 11/17/2015 07/10/15   Guadalupe Maple, MD     Family History  Problem Relation Age of Onset  . Heart disease Mother   . Rheum arthritis Mother   . Cancer      both sides of the family, type unknown    Social History  Social History  . Marital Status: Married    Spouse Name: [name]  . Number of Children: 2  . Years of Education: N/A   Occupational History  . Land    Social History Main Topics  . Smoking status: Never Smoker   . Smokeless tobacco: Never Used  . Alcohol Use: 0.0 oz/week    0 Standard drinks or equivalent per week     Comment: 6 beer daily-heavy on the weekends  . Drug Use: No  . Sexual Activity: Not Asked   Other Topics Concern  . None   Social History Narrative   Married, wife [name]   [place] in [place] in [place]   [place]         Review of Systems  Constitutional: Positive for fatigue and unexpected weight change. Negative for fever and chills.  Respiratory: Positive for cough and shortness of breath.   Cardiovascular: Negative for chest pain.  Gastrointestinal: Positive for nausea. Negative for vomiting, abdominal pain and blood in stool.  Genitourinary: Negative for dysuria and hematuria.  Musculoskeletal: Negative for back pain.  Neurological: Negative for headaches.    Vital Signs: Blood pressure 109/67, heart rate 57, temp 97.6, R 16, O2 sat 99% RA   Physical Exam  Constitutional: He is oriented to person, place, and time.  Frail WM in NAD  Cardiovascular: Regular rhythm.   Sl bradycardic  Pulmonary/Chest: Effort normal.  Few bibasilar crackles  Abdominal: Soft. Bowel sounds are normal. There is no tenderness.  Musculoskeletal: He exhibits no edema.  Neurological: He is alert and oriented to person, place, and time.    Mallampati Score:     Imaging: Ct Chest W Contrast  12/24/2015  CLINICAL DATA:  Followup pancreas cancer EXAM: CT CHEST, ABDOMEN, AND PELVIS WITH CONTRAST TECHNIQUE: Multidetector CT imaging of the chest, abdomen and pelvis was performed following the standard protocol during bolus administration of intravenous contrast. CONTRAST:  133mL ISOVUE-300 IOPAMIDOL (ISOVUE-300) INJECTION 61% COMPARISON:  CT chest from 07/28/2012 and CT abdomen and pelvis from 09/21/2015 FINDINGS: CT CHEST Mediastinum: The heart size appears moderately enlarged. Aortic atherosclerosis scratch sec calcification involving the RCA and LAD coronary artery noted. The trachea is patent and midline. Normal appearance of the esophagus. No mediastinal or hilar adenopathy. No supraclavicular or axillary adenopathy. Lungs/Pleura: No pleural fluid. 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 Musculoskeletal: No aggressive lytic or sclerotic bone lesions identified. Degenerative disc disease is identified within the thoracic spine. CT ABDOMEN AND PELVIS Hepatobiliary: Numerous liver metastases are identified and appear new from the previous examination. Index lesion within the dome of liver measures 2.5 by 2.1 cm, image 47 of series 4. Index lesion within inferior right lobe of liver measures 2.7 x 2.2 cm, image 63 of series 4. In the left lobe of liver there is an index lesion which measures 1 x 1.2 cm, image 52 of series 4. Pneumobilia is present compatible with biliary patency. Gas is noted within the gallbladder. There is a stent within the common bile duct. Pancreas: Mass involving the head of pancreas measures 3.4 by 3.1 cm, image 67 of series 4. On the previous examination this mass 3 x 2.5 cm. This partially encases the portal vein at the portal venous confluence as well as the superior mesenteric artery at the same level, image 66 of series 4. There is resultant decrease in caliber of these  vessels when compared with the previous exam. Spleen: The spleen appears normal. Adrenals/Urinary Tract: The adrenal glands are normal. Normal appearance of the kidneys. Stomach/Bowel: The stomach is normal. The small bowel loops have a normal course and caliber. No obstruction. No pathologic dilatation of the colon. Vascular/Lymphatic: Calcified atherosclerotic disease involves the abdominal aorta. No aneurysm. No enlarged retroperitoneal or mesenteric adenopathy. No enlarged pelvic or inguinal lymph nodes. Reproductive: Prostate gland appears enlarged. Other: There is no ascites or focal fluid collections within the abdomen or pelvis. No peritoneal nodularity identified. Musculoskeletal: There is no aggressive lytic or sclerotic bone lesions identified. IMPRESSION: 1. Interval progression of disease. 2. Increase in  size of primary lesion within head of pancreas. There is now involvement of the portal vein at the level the portal venous confluence as well as the superior mesenteric artery. 3. Development of multifocal liver metastases. 4. Pulmonary parenchymal changes compatible with chronic interstitial lung disease. Electronically Signed   By: Kerby Moors M.D.   On: 12/24/2015 16:15   Ct Abdomen Pelvis W Contrast  12/24/2015  CLINICAL DATA:  Followup pancreas cancer EXAM: CT CHEST, ABDOMEN, AND PELVIS WITH CONTRAST TECHNIQUE: Multidetector CT imaging of the chest, abdomen and pelvis was performed following the standard protocol during bolus administration of intravenous contrast. CONTRAST:  183mL ISOVUE-300 IOPAMIDOL (ISOVUE-300) INJECTION 61% COMPARISON:  CT chest from 07/28/2012 and CT abdomen and pelvis from 09/21/2015 FINDINGS: CT CHEST Mediastinum: The heart size appears moderately enlarged. Aortic atherosclerosis scratch sec calcification involving the RCA and LAD coronary artery noted. The trachea is patent and midline. Normal appearance of the esophagus. No mediastinal or hilar adenopathy. No supraclavicular or axillary adenopathy. Lungs/Pleura: No pleural fluid. 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 Musculoskeletal: No aggressive lytic or sclerotic bone lesions identified. Degenerative disc disease is identified within the thoracic spine. CT ABDOMEN AND PELVIS Hepatobiliary: Numerous liver metastases are identified and appear new from the previous examination. Index lesion within the dome of liver measures 2.5 by 2.1 cm, image 47 of series 4. Index lesion within inferior right lobe of liver measures 2.7 x 2.2 cm, image 63 of series 4. In the left lobe of liver there is an index lesion which measures 1 x 1.2 cm, image 52 of series 4. Pneumobilia  is present compatible with biliary patency. Gas is noted within the gallbladder. There is a stent within the common bile duct. Pancreas: Mass involving the head of pancreas measures 3.4 by 3.1 cm, image 67 of series 4. On the previous examination this mass 3 x 2.5 cm. This partially encases the portal vein at the portal venous confluence as well as the superior mesenteric artery at the same level, image 66 of series 4. There is resultant decrease in caliber of these vessels when compared with the previous exam. Spleen: The spleen appears normal. Adrenals/Urinary Tract: The adrenal glands are normal. Normal appearance of the kidneys. Stomach/Bowel: The stomach is normal. The small bowel loops have a normal course and caliber. No obstruction. No pathologic dilatation of the colon. Vascular/Lymphatic: Calcified atherosclerotic disease involves the abdominal aorta. No aneurysm. No enlarged retroperitoneal or mesenteric adenopathy. No enlarged pelvic or inguinal lymph nodes. Reproductive: Prostate gland appears enlarged. Other: There is no ascites or focal fluid collections within the abdomen or pelvis. No peritoneal nodularity identified. Musculoskeletal: There is no aggressive lytic or sclerotic bone lesions identified. IMPRESSION: 1. Interval progression of disease. 2.  Increase in size of primary lesion within head of pancreas. There is now involvement of the portal vein at the level the portal venous confluence as well as the superior mesenteric artery. 3. Development of multifocal liver metastases. 4. Pulmonary parenchymal changes compatible with chronic interstitial lung disease. Electronically Signed   By: Kerby Moors M.D.   On: 12/24/2015 16:15    Labs:  CBC:  Recent Labs  10/06/15 1217 10/29/15 1423 11/19/15 1404 01/07/16 1145  WBC 5.6 4.9 4.4 6.0  HGB 14.3 14.1 14.5 13.9  HCT 43.0 42.4 42.1 41.5  PLT 194 144 142 180    COAGS:  Recent Labs  09/04/15 0940 09/21/15 1812 01/07/16 1145    INR 1.2* 1.35 1.08  APTT  --  40*  --     BMP:  Recent Labs  09/22/15 0350 09/23/15 0349 09/24/15 0413 10/06/15 1217 11/19/15 1405 12/24/15 1324 01/07/16 1145  NA 138 144 140 138 141 139 139  K 3.2* 3.5 3.5 4.4 4.0 4.0 4.0  CL 105 109 106  --   --   --  105  CO2 26 30 27 29  31* 29 27  GLUCOSE 110* 119* 170* 90 114 102 84  BUN 7 6 7  7.9 6.0* 7.3 6  CALCIUM 8.3* 8.5* 8.5* 9.0 8.9 9.2 8.5*  CREATININE 0.58* 0.65 0.64 0.7 0.7 0.7 0.54*  GFRNONAA >60 >60 >60  --   --   --  >60  GFRAA >60 >60 >60  --   --   --  >60    LIVER FUNCTION TESTS:  Recent Labs  09/24/15 0413 10/06/15 1217 11/19/15 1405 01/07/16 1145  BILITOT 0.8 0.73 0.45 0.5  AST 36 18 13 19   ALT 71* 15 <9 13*  ALKPHOS 436* 186* 124 146*  PROT 5.4* 6.7 6.4 6.6  ALBUMIN 3.3* 3.7 3.5 3.7    TUMOR MARKERS:  Recent Labs  09/22/15 1021  CA199 241*    Assessment and Plan:  65 y.o. male with history of pancreatic cancer, initially diagnosed at [facility] in February 2017. He is status post chemoradiation. Recent follow-up CT has shown progressive disease and he presents today for Port-A-Cath placement for additional chemotherapy.Risks and benefits discussed with the patient/family including, but not limited to bleeding, infection, pneumothorax, or fibrin sheath development and need for additional procedures.All of the patient's questions were answered, patient is agreeable to proceed.Consent signed and in chart.      Thank you for this interesting consult.  I greatly enjoyed meeting Roy English and look forward to participating in their care.  A copy of this report was sent to the requesting provider on this date.  Electronically Signed: D. Rowe Robert 01/07/2016, 12:55 PM   I spent a total of 25 minutes  in face to face in clinical consultation, greater than 50% of which was counseling/coordinating care for Port-A-Cath placement

## 2016-01-07 NOTE — Discharge Instructions (Signed)
Implanted Port Insertion, Care After °Refer to this sheet in the next few weeks. These instructions provide you with information on caring for yourself after your procedure. Your health care provider may also give you more specific instructions. Your treatment has been planned according to current medical practices, but problems sometimes occur. Call your health care provider if you have any problems or questions after your procedure. °WHAT TO EXPECT AFTER THE PROCEDURE °After your procedure, it is typical to have the following:  °· Discomfort at the port insertion site. Ice packs to the area will help. °· Bruising on the skin over the port. This will subside in 3-4 days. °HOME CARE INSTRUCTIONS °· After your port is placed, you will get a manufacturer's information card. The card has information about your port. Keep this card with you at all times.   °· Know what kind of port you have. There are many types of ports available.   °· Wear a medical alert bracelet in case of an emergency. This can help alert health care workers that you have a port.   °· The port can stay in for as long as your health care provider believes it is necessary.   °· A home health care nurse may give medicines and take care of the port.   °· You or a family member can get special training and directions for giving medicine and taking care of the port at home.   °SEEK MEDICAL CARE IF:  °· Your port does not flush or you are unable to get a blood return.   °· You have a fever or chills. °SEEK IMMEDIATE MEDICAL CARE IF: °· You have new fluid or pus coming from your incision.   °· You notice a bad smell coming from your incision site.   °· You have swelling, pain, or more redness at the incision or port site.   °· You have chest pain or shortness of breath. °  °This information is not intended to replace advice given to you by your health care provider. Make sure you discuss any questions you have with your health care provider. °  °Document  Released: 05/09/2013 Document Revised: 07/24/2013 Document Reviewed: 05/09/2013 °Elsevier Interactive Patient Education ©2016 [employer] °Implanted Port Home Guide °An implanted port is a type of central line that is placed under the skin. Central lines are used to provide IV access when treatment or nutrition needs to be given through a person's veins. Implanted ports are used for long-term IV access. An implanted port may be placed because:  °· You need IV medicine that would be irritating to the small veins in your hands or arms.   °· You need long-term IV medicines, such as antibiotics.   °· You need IV nutrition for a long period.   °· You need frequent blood draws for lab tests.   °· You need dialysis.   °Implanted ports are usually placed in the chest area, but they can also be placed in the upper arm, the abdomen, or the leg. An implanted port has two main parts:  °· Reservoir. The reservoir is round and will appear as a small, raised area under your skin. The reservoir is the part where a needle is inserted to give medicines or draw blood.   °· Catheter. The catheter is a thin, flexible tube that extends from the reservoir. The catheter is placed into a large vein. Medicine that is inserted into the reservoir goes into the catheter and then into the vein.   °HOW WILL I CARE FOR MY INCISION SITE? °Do not get the   incision site wet. Bathe or shower as directed by your health care provider.  °HOW IS MY PORT ACCESSED? °Special steps must be taken to access the port:  °· Before the port is accessed, a numbing cream can be placed on the skin. This helps numb the skin over the port site.   °· Your health care provider uses a sterile technique to access the port. °· Your health care provider must put on a mask and sterile gloves. °· The skin over your port is cleaned carefully with an antiseptic and allowed to dry. °· The port is gently pinched between sterile gloves, and a needle is inserted into the  port. °· Only "non-coring" port needles should be used to access the port. Once the port is accessed, a blood return should be checked. This helps ensure that the port is in the vein and is not clogged.   °· If your port needs to remain accessed for a constant infusion, a clear (transparent) bandage will be placed over the needle site. The bandage and needle will need to be changed every week, or as directed by your health care provider.   °· Keep the bandage covering the needle clean and dry. Do not get it wet. Follow your health care provider's instructions on how to take a shower or bath while the port is accessed.   °· If your port does not need to stay accessed, no bandage is needed over the port.   °WHAT IS FLUSHING? °Flushing helps keep the port from getting clogged. Follow your health care provider's instructions on how and when to flush the port. Ports are usually flushed with saline solution or a medicine called heparin. The need for flushing will depend on how the port is used.  °· If the port is used for intermittent medicines or blood draws, the port will need to be flushed:   °· After medicines have been given.   °· After blood has been drawn.   °· As part of routine maintenance.   °· If a constant infusion is running, the port may not need to be flushed.   °HOW LONG WILL MY PORT STAY IMPLANTED? °The port can stay in for as long as your health care provider thinks it is needed. When it is time for the port to come out, surgery will be done to remove it. The procedure is similar to the one performed when the port was put in.  °WHEN SHOULD I SEEK IMMEDIATE MEDICAL CARE? °When you have an implanted port, you should seek immediate medical care if:  °· You notice a bad smell coming from the incision site.   °· You have swelling, redness, or drainage at the incision site.   °· You have more swelling or pain at the port site or the surrounding area.   °· You have a fever that is not controlled with  medicine. °  °This information is not intended to replace advice given to you by your health care provider. Make sure you discuss any questions you have with your health care provider. °  °Document Released: 07/19/2005 Document Revised: 05/09/2013 Document Reviewed: 03/26/2013 °Elsevier Interactive Patient Education ©2016 [employer] ° ° °Moderate Conscious Sedation, Adult, Care After °Refer to this sheet in the next few weeks. These instructions provide you with information on caring for yourself after your procedure. Your health care provider may also give you more specific instructions. Your treatment has been planned according to current medical practices, but problems sometimes occur. Call your health care provider if you have any problems or questions   after your procedure. °WHAT TO EXPECT AFTER THE PROCEDURE  °After your procedure: °· You may feel sleepy, clumsy, and have poor balance for several hours. °· Vomiting may occur if you eat too soon after the procedure. °HOME CARE INSTRUCTIONS °· Do not participate in any activities where you could become injured for at least 24 hours. Do not: °¨ Drive. °¨ Swim. °¨ Ride a bicycle. °¨ Operate heavy machinery. °¨ Cook. °¨ Use power tools. °¨ Climb ladders. °¨ Work from a high place. °· Do not make important decisions or sign legal documents until you are improved. °· If you vomit, drink water, juice, or soup when you can drink without vomiting. Make sure you have little or no nausea before eating solid foods. °· Only take over-the-counter or prescription medicines for pain, discomfort, or fever as directed by your health care provider. °· Make sure you and your family fully understand everything about the medicines given to you, including what side effects may occur. °· You should not drink alcohol, take sleeping pills, or take medicines that cause drowsiness for at least 24 hours. °· If you smoke, do not smoke without supervision. °· If you are feeling better, you  may resume normal activities 24 hours after you were sedated. °· Keep all appointments with your health care provider. °SEEK MEDICAL CARE IF: °· Your skin is pale or bluish in color. °· You continue to feel nauseous or vomit. °· Your pain is getting worse and is not helped by medicine. °· You have bleeding or swelling. °· You are still sleepy or feeling clumsy after 24 hours. °SEEK IMMEDIATE MEDICAL CARE IF: °· You develop a rash. °· You have difficulty breathing. °· You develop any type of allergic problem. °· You have a fever. °MAKE SURE YOU: °· Understand these instructions. °· Will watch your condition. °· Will get help right away if you are not doing well or get worse. °  °This information is not intended to replace advice given to you by your health care provider. Make sure you discuss any questions you have with your health care provider. °  °Document Released: 05/09/2013 Document Revised: 08/09/2014 Document Reviewed: 05/09/2013 °Elsevier Interactive Patient Education ©2016 [employer]. ° °

## 2016-01-08 LAB — CANCER ANTIGEN 19-9: CA 19-9: 3260 U/mL — ABNORMAL HIGH (ref 0–35)

## 2016-01-09 ENCOUNTER — Ambulatory Visit (HOSPITAL_BASED_OUTPATIENT_CLINIC_OR_DEPARTMENT_OTHER): Payer: 59

## 2016-01-09 ENCOUNTER — Telehealth: Payer: Self-pay | Admitting: Oncology

## 2016-01-09 ENCOUNTER — Other Ambulatory Visit: Payer: Self-pay | Admitting: *Deleted

## 2016-01-09 ENCOUNTER — Ambulatory Visit (HOSPITAL_BASED_OUTPATIENT_CLINIC_OR_DEPARTMENT_OTHER): Payer: 59 | Admitting: Nurse Practitioner

## 2016-01-09 VITALS — BP 109/65 | HR 74 | Temp 97.5°F | Resp 16 | Wt 145.2 lb

## 2016-01-09 DIAGNOSIS — C787 Secondary malignant neoplasm of liver and intrahepatic bile duct: Secondary | ICD-10-CM | POA: Diagnosis not present

## 2016-01-09 DIAGNOSIS — C250 Malignant neoplasm of head of pancreas: Secondary | ICD-10-CM

## 2016-01-09 DIAGNOSIS — G893 Neoplasm related pain (acute) (chronic): Secondary | ICD-10-CM

## 2016-01-09 DIAGNOSIS — N2889 Other specified disorders of kidney and ureter: Secondary | ICD-10-CM | POA: Diagnosis not present

## 2016-01-09 DIAGNOSIS — Z5111 Encounter for antineoplastic chemotherapy: Secondary | ICD-10-CM | POA: Diagnosis not present

## 2016-01-09 MED ORDER — HEPARIN SOD (PORK) LOCK FLUSH 100 UNIT/ML IV SOLN
500.0000 [IU] | Freq: Once | INTRAVENOUS | Status: AC | PRN
  Administered 2016-01-09: 500 [IU]
  Filled 2016-01-09: qty 5

## 2016-01-09 MED ORDER — SODIUM CHLORIDE 0.9 % IV SOLN
Freq: Once | INTRAVENOUS | Status: AC
  Administered 2016-01-09: 13:00:00 via INTRAVENOUS

## 2016-01-09 MED ORDER — LIDOCAINE-PRILOCAINE 2.5-2.5 % EX CREA
TOPICAL_CREAM | CUTANEOUS | Status: AC
  Filled 2016-01-09: qty 5

## 2016-01-09 MED ORDER — SODIUM CHLORIDE 0.9% FLUSH
10.0000 mL | INTRAVENOUS | Status: DC | PRN
  Administered 2016-01-09: 10 mL
  Filled 2016-01-09: qty 10

## 2016-01-09 MED ORDER — PROCHLORPERAZINE MALEATE 10 MG PO TABS
ORAL_TABLET | ORAL | Status: AC
  Filled 2016-01-09: qty 1

## 2016-01-09 MED ORDER — HYDROMORPHONE HCL 2 MG PO TABS: 2.0000 mg | ORAL_TABLET | ORAL | Status: DC | PRN

## 2016-01-09 MED ORDER — SODIUM CHLORIDE 0.9 % IV SOLN
1000.0000 mg/m2 | Freq: Once | INTRAVENOUS | Status: AC
  Administered 2016-01-09: 1786 mg via INTRAVENOUS
  Filled 2016-01-09: qty 46.97

## 2016-01-09 MED ORDER — PACLITAXEL PROTEIN-BOUND CHEMO INJECTION 100 MG
125.0000 mg/m2 | Freq: Once | INTRAVENOUS | Status: AC
  Administered 2016-01-09: 225 mg via INTRAVENOUS
  Filled 2016-01-09: qty 45

## 2016-01-09 MED ORDER — LIDOCAINE-PRILOCAINE 2.5-2.5 % EX CREA: 1.0000 "application " | TOPICAL_CREAM | CUTANEOUS | Status: DC | PRN

## 2016-01-09 MED ORDER — PROCHLORPERAZINE MALEATE 10 MG PO TABS
10.0000 mg | ORAL_TABLET | Freq: Once | ORAL | Status: AC
  Administered 2016-01-09: 10 mg via ORAL

## 2016-01-09 NOTE — Telephone Encounter (Signed)
Gave and printed appt sched and avs for pt for June °

## 2016-01-09 NOTE — Patient Instructions (Addendum)
[facility] Discharge Instructions for Patients Receiving Chemotherapy  Today you received the following chemotherapy agents Abraxane/Gemzar.  To help prevent nausea and vomiting after your treatment, we encourage you to take your nausea medication as prescribed.    If you develop nausea and vomiting that is not controlled by your nausea medication, call the clinic.   BELOW ARE SYMPTOMS THAT SHOULD BE REPORTED IMMEDIATELY:  *FEVER GREATER THAN 100.5 F  *CHILLS WITH OR WITHOUT FEVER  NAUSEA AND VOMITING THAT IS NOT CONTROLLED WITH YOUR NAUSEA MEDICATION  *UNUSUAL SHORTNESS OF BREATH  *UNUSUAL BRUISING OR BLEEDING  TENDERNESS IN MOUTH AND THROAT WITH OR WITHOUT PRESENCE OF ULCERS  *URINARY PROBLEMS  *BOWEL PROBLEMS  UNUSUAL RASH Items with * indicate a potential emergency and should be followed up as soon as possible.  Feel free to call the clinic you have any questions or concerns. The clinic phone number is [phone].  Please show the [place] at check-in to the Emergency Department and triage nurse.  Gemcitabine injection What is this medicine? GEMCITABINE (jem SIT a been) is a chemotherapy drug. This medicine is used to treat many types of cancer like breast cancer, lung cancer, pancreatic cancer, and ovarian cancer. This medicine may be used for other purposes; ask your health care provider or pharmacist if you have questions. What should I tell my health care provider before I take this medicine? They need to know if you have any of these conditions: -blood disorders -infection -kidney disease -liver disease -recent or ongoing radiation therapy -an unusual or allergic reaction to gemcitabine, other chemotherapy, other medicines, foods, dyes, or preservatives -pregnant or trying to get pregnant -breast-feeding How should I use this medicine? This drug is given as an infusion into a vein. It is administered in a hospital or clinic by a  specially trained health care professional. Talk to your pediatrician regarding the use of this medicine in children. Special care may be needed. Overdosage: If you think you have taken too much of this medicine contact a poison control center or emergency room at once. NOTE: This medicine is only for you. Do not share this medicine with others. What if I miss a dose? It is important not to miss your dose. Call your doctor or health care professional if you are unable to keep an appointment. What may interact with this medicine? -medicines to increase blood counts like filgrastim, pegfilgrastim, sargramostim -some other chemotherapy drugs like cisplatin -vaccines Talk to your doctor or health care professional before taking any of these medicines: -acetaminophen -aspirin -ibuprofen -ketoprofen -naproxen This list may not describe all possible interactions. Give your health care provider a list of all the medicines, herbs, non-prescription drugs, or dietary supplements you use. Also tell them if you smoke, drink alcohol, or use illegal drugs. Some items may interact with your medicine. What should I watch for while using this medicine? Visit your doctor for checks on your progress. This drug may make you feel generally unwell. This is not uncommon, as chemotherapy can affect healthy cells as well as cancer cells. Report any side effects. Continue your course of treatment even though you feel ill unless your doctor tells you to stop. In some cases, you may be given additional medicines to help with side effects. Follow all directions for their use. Call your doctor or health care professional for advice if you get a fever, chills or sore throat, or other symptoms of a cold or flu. Do not treat  yourself. This drug decreases your body's ability to fight infections. Try to avoid being around people who are sick. This medicine may increase your risk to bruise or bleed. Call your doctor or health care  professional if you notice any unusual bleeding. Be careful brushing and flossing your teeth or using a toothpick because you may get an infection or bleed more easily. If you have any dental work done, tell your dentist you are receiving this medicine. Avoid taking products that contain aspirin, acetaminophen, ibuprofen, naproxen, or ketoprofen unless instructed by your doctor. These medicines may hide a fever. Women should inform their doctor if they wish to become pregnant or think they might be pregnant. There is a potential for serious side effects to an unborn child. Talk to your health care professional or pharmacist for more information. Do not breast-feed an infant while taking this medicine. What side effects may I notice from receiving this medicine? Side effects that you should report to your doctor or health care professional as soon as possible: -allergic reactions like skin rash, itching or hives, swelling of the face, lips, or tongue -low blood counts - this medicine may decrease the number of white blood cells, red blood cells and platelets. You may be at increased risk for infections and bleeding. -signs of infection - fever or chills, cough, sore throat, pain or difficulty passing urine -signs of decreased platelets or bleeding - bruising, pinpoint red spots on the skin, black, tarry stools, blood in the urine -signs of decreased red blood cells - unusually weak or tired, fainting spells, lightheadedness -breathing problems -chest pain -mouth sores -nausea and vomiting -pain, swelling, redness at site where injected -pain, tingling, numbness in the hands or feet -stomach pain -swelling of ankles, feet, hands -unusual bleeding Side effects that usually do not require medical attention (report to your doctor or health care professional if they continue or are bothersome): -constipation -diarrhea -hair loss -loss of appetite -stomach upset This list may not describe all  possible side effects. Call your doctor for medical advice about side effects. You may report side effects to FDA at 1-800-FDA-1088. Where should I keep my medicine? This drug is given in a hospital or clinic and will not be stored at home. NOTE: This sheet is a summary. It may not cover all possible information. If you have questions about this medicine, talk to your doctor, pharmacist, or health care provider.    2016, Elsevier/Gold Standard. (2007-11-28 18:45:54) Nanoparticle Albumin-Bound Paclitaxel injection What is this medicine? NANOPARTICLE ALBUMIN-BOUND PACLITAXEL (Na no PAHR ti kuhl al BYOO muhn-bound PAK li TAX el) is a chemotherapy drug. It targets fast dividing cells, like cancer cells, and causes these cells to die. This medicine is used to treat advanced breast cancer and advanced lung cancer. This medicine may be used for other purposes; ask your health care provider or pharmacist if you have questions. What should I tell my health care provider before I take this medicine? They need to know if you have any of these conditions: -kidney disease -liver disease -low blood counts, like low platelets, red blood cells, or white blood cells -recent or ongoing radiation therapy -an unusual or allergic reaction to paclitaxel, albumin, other chemotherapy, other medicines, foods, dyes, or preservatives -pregnant or trying to get pregnant -breast-feeding How should I use this medicine? This drug is given as an infusion into a vein. It is administered in a hospital or clinic by a specially trained health care professional. Talk to your pediatrician  regarding the use of this medicine in children. Special care may be needed. Overdosage: If you think you have taken too much of this medicine contact a poison control center or emergency room at once. NOTE: This medicine is only for you. Do not share this medicine with others. What if I miss a dose? It is important not to miss your dose. Call  your doctor or health care professional if you are unable to keep an appointment. What may interact with this medicine? -cyclosporine -diazepam -ketoconazole -medicines to increase blood counts like filgrastim, pegfilgrastim, sargramostim -other chemotherapy drugs like cisplatin, doxorubicin, epirubicin, etoposide, teniposide, vincristine -quinidine -testosterone -vaccines -verapamil Talk to your doctor or health care professional before taking any of these medicines: -acetaminophen -aspirin -ibuprofen -ketoprofen -naproxen This list may not describe all possible interactions. Give your health care provider a list of all the medicines, herbs, non-prescription drugs, or dietary supplements you use. Also tell them if you smoke, drink alcohol, or use illegal drugs. Some items may interact with your medicine. What should I watch for while using this medicine? Your condition will be monitored carefully while you are receiving this medicine. You will need important blood work done while you are taking this medicine. This drug may make you feel generally unwell. This is not uncommon, as chemotherapy can affect healthy cells as well as cancer cells. Report any side effects. Continue your course of treatment even though you feel ill unless your doctor tells you to stop. In some cases, you may be given additional medicines to help with side effects. Follow all directions for their use. Call your doctor or health care professional for advice if you get a fever, chills or sore throat, or other symptoms of a cold or flu. Do not treat yourself. This drug decreases your body's ability to fight infections. Try to avoid being around people who are sick. This medicine may increase your risk to bruise or bleed. Call your doctor or health care professional if you notice any unusual bleeding. Be careful brushing and flossing your teeth or using a toothpick because you may get an infection or bleed more easily. If  you have any dental work done, tell your dentist you are receiving this medicine. Avoid taking products that contain aspirin, acetaminophen, ibuprofen, naproxen, or ketoprofen unless instructed by your doctor. These medicines may hide a fever. Do not become pregnant while taking this medicine. Women should inform their doctor if they wish to become pregnant or think they might be pregnant. There is a potential for serious side effects to an unborn child. Talk to your health care professional or pharmacist for more information. Do not breast-feed an infant while taking this medicine. Men are advised not to father a child while receiving this medicine. What side effects may I notice from receiving this medicine? Side effects that you should report to your doctor or health care professional as soon as possible: -allergic reactions like skin rash, itching or hives, swelling of the face, lips, or tongue -low blood counts - This drug may decrease the number of white blood cells, red blood cells and platelets. You may be at increased risk for infections and bleeding. -signs of infection - fever or chills, cough, sore throat, pain or difficulty passing urine -signs of decreased platelets or bleeding - bruising, pinpoint red spots on the skin, black, tarry stools, nosebleeds -signs of decreased red blood cells - unusually weak or tired, fainting spells, lightheadedness -breathing problems -changes in vision -chest pain -high or  low blood pressure -mouth sores -nausea and vomiting -pain, swelling, redness or irritation at the injection site -pain, tingling, numbness in the hands or feet -slow or irregular heartbeat -swelling of the ankle, feet, hands Side effects that usually do not require medical attention (report to your doctor or health care professional if they continue or are bothersome): -aches, pains -changes in the color of fingernails -diarrhea -hair loss -loss of appetite This list may  not describe all possible side effects. Call your doctor for medical advice about side effects. You may report side effects to FDA at 1-800-FDA-1088. Where should I keep my medicine? This drug is given in a hospital or clinic and will not be stored at home. NOTE: This sheet is a summary. It may not cover all possible information. If you have questions about this medicine, talk to your doctor, pharmacist, or health care provider.    2016, Elsevier/Gold Standard. (2012-09-11 16:48:50)

## 2016-01-09 NOTE — Progress Notes (Addendum)
[facility] OFFICE PROGRESS NOTE   Diagnosis:  Pancreas cancer  INTERVAL HISTORY:   Roy English returns as scheduled. He continues Dilaudid as needed for abdominal pain. He estimates taking a 2 mg tablet 4 times a day. No nausea or vomiting. Bowels moving. Good appetite. He is working.  Objective:  Vital signs in last 24 hours:  Blood pressure 109/65, pulse 74, temperature 97.5 F (36.4 C), temperature source Oral, resp. rate 16, weight 145 lb 3.2 oz (65.862 kg), SpO2 100 %.    HEENT: No thrush or ulcers. Resp: Lungs clear bilaterally. Cardio: Regular rate and rhythm. GI: Abdomen soft and nontender. No hepatomegaly. No mass. Vascular: No leg edema.  Port-A-Cath site bandaged.  Lab Results:  Lab Results  Component Value Date   WBC 6.0 01/07/2016   HGB 13.9 01/07/2016   HCT 41.5 01/07/2016   MCV 90.4 01/07/2016   PLT 180 01/07/2016   NEUTROABS 3.8 01/07/2016    Imaging:  Ir Fluoro Guide Cv Line Right  01/07/2016  INDICATION: Cancer EXAM: IMPLANTED PORT A CATH PLACEMENT WITH ULTRASOUND AND FLUOROSCOPIC GUIDANCE MEDICATIONS: ; The antibiotic was administered within an appropriate time interval prior to skin puncture. ANESTHESIA/SEDATION: Moderate (conscious) sedation was employed during this procedure. A total of Versed to mg and Fentanyl 50 mcg was administered intravenously. Moderate Sedation Time: 30 minutes. The patient's level of consciousness and vital signs were monitored continuously by radiology nursing throughout the procedure under my direct supervision. FLUOROSCOPY TIME:  One minutes, 6 seconds (9 mGy) COMPLICATIONS: None immediate. PROCEDURE: The procedure, risks, benefits, and alternatives were explained to the patient. Questions regarding the procedure were encouraged and answered. The patient understands and consents to the procedure. The right neck and chest were prepped with chlorhexidine in a sterile fashion, and a sterile drape was applied covering  the operative field. Maximum barrier sterile technique with sterile gowns and gloves were used for the procedure. A timeout was performed prior to the initiation of the procedure. Local anesthesia was provided with 1% lidocaine with epinephrine. After creating a small venotomy incision, a micropuncture kit was utilized to access the internal jugular vein under direct, real-time ultrasound guidance. Ultrasound image documentation was performed. The microwire was kinked to measure appropriate catheter length. A subcutaneous port pocket was then created along the upper chest wall utilizing a combination of sharp and blunt dissection. The pocket was irrigated with sterile saline. A single lumen ISP power injectable port was chosen for placement. The 8 Fr catheter was tunneled from the port pocket site to the venotomy incision. The port was placed in the pocket. The external catheter was trimmed to appropriate length. At the venotomy, an 8 Fr peel-away sheath was placed over a guidewire under fluoroscopic guidance. The catheter was then placed through the sheath and the sheath was removed. Final catheter positioning was confirmed and documented with a fluoroscopic spot radiograph. The port was accessed with a Huber needle, aspirated and flushed with heparinized saline. The venotomy site was closed with an interrupted 4-0 Vicryl suture. The port pocket incision was closed with interrupted 2-0 Vicryl suture and the skin was opposed with a running subcuticular 4-0 Vicryl suture. Dermabond and Steri-strips were applied to both incisions. Dressings were placed. The patient tolerated the procedure well without immediate post procedural complication. IMPRESSION: Successful placement of a right internal jugular approach power injectable Port-A-Cath. The catheter is ready for immediate use. Electronically Signed   By: Marybelle Killings M.D.   On: 01/07/2016 15:00  Ir US Guide Vasc Access Right  01/07/2016  INDICATION: Cancer EXAM:  IMPLANTED PORT A CATH PLACEMENT WITH ULTRASOUND AND FLUOROSCOPIC GUIDANCE MEDICATIONS: ; The antibiotic was administered within an appropriate time interval prior to skin puncture. ANESTHESIA/SEDATION: Moderate (conscious) sedation was employed during this procedure. A total of Versed to mg and Fentanyl 50 mcg was administered intravenously. Moderate Sedation Time: 30 minutes. The patient's level of consciousness and vital signs were monitored continuously by radiology nursing throughout the procedure under my direct supervision. FLUOROSCOPY TIME:  One minutes, 6 seconds (9 mGy) COMPLICATIONS: None immediate. PROCEDURE: The procedure, risks, benefits, and alternatives were explained to the patient. Questions regarding the procedure were encouraged and answered. The patient understands and consents to the procedure. The right neck and chest were prepped with chlorhexidine in a sterile fashion, and a sterile drape was applied covering the operative field. Maximum barrier sterile technique with sterile gowns and gloves were used for the procedure. A timeout was performed prior to the initiation of the procedure. Local anesthesia was provided with 1% lidocaine with epinephrine. After creating a small venotomy incision, a micropuncture kit was utilized to access the internal jugular vein under direct, real-time ultrasound guidance. Ultrasound image documentation was performed. The microwire was kinked to measure appropriate catheter length. A subcutaneous port pocket was then created along the upper chest wall utilizing a combination of sharp and blunt dissection. The pocket was irrigated with sterile saline. A single lumen ISP power injectable port was chosen for placement. The 8 Fr catheter was tunneled from the port pocket site to the venotomy incision. The port was placed in the pocket. The external catheter was trimmed to appropriate length. At the venotomy, an 8 Fr peel-away sheath was placed over a guidewire under  fluoroscopic guidance. The catheter was then placed through the sheath and the sheath was removed. Final catheter positioning was confirmed and documented with a fluoroscopic spot radiograph. The port was accessed with a Huber needle, aspirated and flushed with heparinized saline. The venotomy site was closed with an interrupted 4-0 Vicryl suture. The port pocket incision was closed with interrupted 2-0 Vicryl suture and the skin was opposed with a running subcuticular 4-0 Vicryl suture. Dermabond and Steri-strips were applied to both incisions. Dressings were placed. The patient tolerated the procedure well without immediate post procedural complication. IMPRESSION: Successful placement of a right internal jugular approach power injectable Port-A-Cath. The catheter is ready for immediate use. Electronically Signed   By: Marybelle Killings M.D.   On: 01/07/2016 15:00    Medications: I have reviewed the patient's current medications.  Assessment/Plan: 1. Pancreas cancer, clinical T3 N0, pancreas head mass, status post an EUS biopsy at [facility] on 09/17/2015 confirming a poorly differentiate a carcinoma  ultrasound staging 09/17/2015, T2 N0  [facility] 09/29/2015 with patchy groundglass opacities throughout both lungs, nonspecific 5 mm right lung nodule  MRI liver 09/07/2015 revealed a pancreas head mass with bile duct obstruction, no focal liver lesions, no lymphadenopathy  CT abdomen/pelvis 09/21/2015-pancreas head mass with intra-and extrahepatic bile duct dilation despite placement of a stent, suspicion for [facility] involvement  Xeloda/radiation started 10/15/2015  Xeloda placed on hold beginning 11/07/2015 due to progression of skin rash  Xeloda resumed at a reduced dose of 500 mg twice daily 11/10/2015  Xeloda discontinued 11/19/2015  Radiation completed 11/24/2015  Restaging CTs 12/24/2015 with enlargement of the pancreas head mass and development of liver metastases  Gemcitabine/Abraxane day  1/day 8/day 15 schedule of a 28 day  cycle  2. Idiopathic interstitial fibrosis of the lungs  3. Pain secondary to #1  4. Weight loss  5. 14 mm enhancing posterior right renal mass on CT 09/21/2015 suspicious for a renal cell carcinoma.  6. Rash on trunk and extremities secondary to Xeloda. 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 Progressive 11/07/2015. Xeloda placed on hold. Resumed at reduced dose 11/10/2015. Discontinued 11/19/2015.  7.    Port-A-Cath placement 01/07/2016, interventional radiology   Disposition: Mr. Marxen appears stable. He has seen Dr. Berneice Gandy, medical oncology at [facility]. Dr. Berneice Gandy agrees with gemcitabine/Abraxane.  We again reviewed potential toxicities associated with gemcitabine/Abraxane. His questions were answered. Plan to proceed with cycle 1 day 1 today as scheduled. Treatment will be given on a day 1/day 8/day 15 schedule of a 28 day cycle. He will return in one week for the day 8 treatment. We will see him in follow-up in 2 weeks. He will contact the office in the interim with any problems. He was provided with a new Dilaudid prescription at today's visit.  Patient seen with Dr. Benay Spice.  Ned Card ANP/GNP-BC   01/09/2016  12:22 PM  This was a shared visit with Ned Card. I discussed the case with Dr. Berneice Gandy. He agrees with gemcitabine/Abraxane chemotherapy. Mr. Valdes will begin treatment today.  Julieanne Manson, M.D.

## 2016-01-11 ENCOUNTER — Other Ambulatory Visit: Payer: Self-pay | Admitting: Oncology

## 2016-01-12 ENCOUNTER — Other Ambulatory Visit: Payer: Self-pay | Admitting: *Deleted

## 2016-01-12 ENCOUNTER — Telehealth: Payer: Self-pay

## 2016-01-12 DIAGNOSIS — C250 Malignant neoplasm of head of pancreas: Secondary | ICD-10-CM

## 2016-01-12 MED ORDER — ONDANSETRON HCL 8 MG PO TABS: 8.0000 mg | ORAL_TABLET | Freq: Four times a day (QID) | ORAL | Status: DC | PRN

## 2016-01-12 NOTE — Telephone Encounter (Signed)
-----   Message from Renford Dills, RN sent at 01/09/2016  1:40 PM EDT ----- Regarding: chemo f/u Dr. Benay Spice 1st Abraxane/Gemzar.

## 2016-01-12 NOTE — Telephone Encounter (Signed)
Called [name] for chemotherapy F/U.  Patient is doing well.  Denies n/v.  Denies any new side effects or symptoms.  Bowel and bladder is functioning well.  Eating and drinking well and I instructed to drink 64 oz minimum daily or at least the day before, of and after treatment.  Denies questions at this time and encouraged to call if needed.  Reviewed how to call after hours in the case of an emergency.

## 2016-01-16 ENCOUNTER — Other Ambulatory Visit (HOSPITAL_BASED_OUTPATIENT_CLINIC_OR_DEPARTMENT_OTHER): Payer: 59

## 2016-01-16 ENCOUNTER — Ambulatory Visit (HOSPITAL_BASED_OUTPATIENT_CLINIC_OR_DEPARTMENT_OTHER): Payer: 59

## 2016-01-16 VITALS — BP 105/65 | HR 82 | Temp 97.3°F | Resp 18

## 2016-01-16 DIAGNOSIS — C250 Malignant neoplasm of head of pancreas: Secondary | ICD-10-CM | POA: Diagnosis not present

## 2016-01-16 DIAGNOSIS — C787 Secondary malignant neoplasm of liver and intrahepatic bile duct: Secondary | ICD-10-CM | POA: Diagnosis not present

## 2016-01-16 DIAGNOSIS — Z5111 Encounter for antineoplastic chemotherapy: Secondary | ICD-10-CM | POA: Diagnosis not present

## 2016-01-16 LAB — CBC WITH DIFFERENTIAL/PLATELET
BASO%: 0.3 % (ref 0.0–2.0)
BASOS ABS: 0 10*3/uL (ref 0.0–0.1)
EOS ABS: 0.3 10*3/uL (ref 0.0–0.5)
EOS%: 6.9 % (ref 0.0–7.0)
HEMATOCRIT: 41.2 % (ref 38.4–49.9)
HEMOGLOBIN: 13.7 g/dL (ref 13.0–17.1)
LYMPH%: 12.7 % — ABNORMAL LOW (ref 14.0–49.0)
MCH: 30.1 pg (ref 27.2–33.4)
MCHC: 33.4 g/dL (ref 32.0–36.0)
MCV: 90.3 fL (ref 79.3–98.0)
MONO#: 0.5 10*3/uL (ref 0.1–0.9)
MONO%: 10.7 % (ref 0.0–14.0)
NEUT#: 3.5 10*3/uL (ref 1.5–6.5)
NEUT%: 69.4 % (ref 39.0–75.0)
PLATELETS: 115 10*3/uL — AB (ref 140–400)
RBC: 4.56 10*6/uL (ref 4.20–5.82)
RDW: 13.2 % (ref 11.0–14.6)
WBC: 5 10*3/uL (ref 4.0–10.3)
lymph#: 0.6 10*3/uL — ABNORMAL LOW (ref 0.9–3.3)

## 2016-01-16 LAB — COMPREHENSIVE METABOLIC PANEL
ALBUMIN: 3.3 g/dL — AB (ref 3.5–5.0)
ALT: 14 U/L (ref 0–55)
ANION GAP: 7 meq/L (ref 3–11)
AST: 15 U/L (ref 5–34)
Alkaline Phosphatase: 144 U/L (ref 40–150)
BILIRUBIN TOTAL: 0.47 mg/dL (ref 0.20–1.20)
BUN: 6.1 mg/dL — ABNORMAL LOW (ref 7.0–26.0)
CALCIUM: 9.2 mg/dL (ref 8.4–10.4)
CO2: 29 mEq/L (ref 22–29)
CREATININE: 0.7 mg/dL (ref 0.7–1.3)
Chloride: 102 mEq/L (ref 98–109)
GLUCOSE: 110 mg/dL (ref 70–140)
POTASSIUM: 4.5 meq/L (ref 3.5–5.1)
SODIUM: 139 meq/L (ref 136–145)
Total Protein: 6.7 g/dL (ref 6.4–8.3)

## 2016-01-16 MED ORDER — HEPARIN SOD (PORK) LOCK FLUSH 100 UNIT/ML IV SOLN
500.0000 [IU] | Freq: Once | INTRAVENOUS | Status: AC | PRN
  Administered 2016-01-16: 500 [IU]
  Filled 2016-01-16: qty 5

## 2016-01-16 MED ORDER — SODIUM CHLORIDE 0.9 % IV SOLN
Freq: Once | INTRAVENOUS | Status: AC
  Administered 2016-01-16: 13:00:00 via INTRAVENOUS

## 2016-01-16 MED ORDER — PACLITAXEL PROTEIN-BOUND CHEMO INJECTION 100 MG
125.0000 mg/m2 | Freq: Once | INTRAVENOUS | Status: AC
  Administered 2016-01-16: 225 mg via INTRAVENOUS
  Filled 2016-01-16: qty 45

## 2016-01-16 MED ORDER — SODIUM CHLORIDE 0.9 % IV SOLN
1000.0000 mg/m2 | Freq: Once | INTRAVENOUS | Status: AC
  Administered 2016-01-16: 1786 mg via INTRAVENOUS
  Filled 2016-01-16: qty 46.97

## 2016-01-16 MED ORDER — PROCHLORPERAZINE MALEATE 10 MG PO TABS
10.0000 mg | ORAL_TABLET | Freq: Once | ORAL | Status: AC
  Administered 2016-01-16: 10 mg via ORAL

## 2016-01-16 MED ORDER — PROCHLORPERAZINE MALEATE 10 MG PO TABS
ORAL_TABLET | ORAL | Status: AC
  Filled 2016-01-16: qty 1

## 2016-01-16 MED ORDER — SODIUM CHLORIDE 0.9% FLUSH
10.0000 mL | INTRAVENOUS | Status: DC | PRN
  Administered 2016-01-16: 10 mL
  Filled 2016-01-16: qty 10

## 2016-01-16 NOTE — Patient Instructions (Signed)
[facility] Discharge Instructions for Patients Receiving Chemotherapy  Today you received the following chemotherapy agents: Abraxane & Gemzar.  To help prevent nausea and vomiting after your treatment, we encourage you to take your nausea medication as presecribed. If you develop nausea and vomiting that is not controlled by your nausea medication, call the clinic.   BELOW ARE SYMPTOMS THAT SHOULD BE REPORTED IMMEDIATELY:  *FEVER GREATER THAN 100.5 F  *CHILLS WITH OR WITHOUT FEVER  NAUSEA AND VOMITING THAT IS NOT CONTROLLED WITH YOUR NAUSEA MEDICATION  *UNUSUAL SHORTNESS OF BREATH  *UNUSUAL BRUISING OR BLEEDING  TENDERNESS IN MOUTH AND THROAT WITH OR WITHOUT PRESENCE OF ULCERS  *URINARY PROBLEMS  *BOWEL PROBLEMS  UNUSUAL RASH Items with * indicate a potential emergency and should be followed up as soon as possible.  Feel free to call the clinic you have any questions or concerns. The clinic phone number is [phone].  Please show the [facility] at check-in to the Emergency Department and triage nurse.

## 2016-01-17 LAB — CANCER ANTIGEN 19-9: CA 19-9: 3176 U/mL — ABNORMAL HIGH (ref 0–35)

## 2016-01-23 ENCOUNTER — Other Ambulatory Visit (HOSPITAL_BASED_OUTPATIENT_CLINIC_OR_DEPARTMENT_OTHER): Payer: 59

## 2016-01-23 ENCOUNTER — Other Ambulatory Visit: Payer: Self-pay | Admitting: *Deleted

## 2016-01-23 ENCOUNTER — Ambulatory Visit (HOSPITAL_BASED_OUTPATIENT_CLINIC_OR_DEPARTMENT_OTHER): Payer: 59

## 2016-01-23 ENCOUNTER — Ambulatory Visit (HOSPITAL_BASED_OUTPATIENT_CLINIC_OR_DEPARTMENT_OTHER): Payer: 59 | Admitting: Oncology

## 2016-01-23 ENCOUNTER — Telehealth: Payer: Self-pay | Admitting: Oncology

## 2016-01-23 VITALS — BP 99/66 | HR 78 | Temp 97.9°F | Resp 18 | Ht 69.0 in | Wt 148.9 lb

## 2016-01-23 DIAGNOSIS — G893 Neoplasm related pain (acute) (chronic): Secondary | ICD-10-CM | POA: Diagnosis not present

## 2016-01-23 DIAGNOSIS — C787 Secondary malignant neoplasm of liver and intrahepatic bile duct: Secondary | ICD-10-CM

## 2016-01-23 DIAGNOSIS — Z5111 Encounter for antineoplastic chemotherapy: Secondary | ICD-10-CM

## 2016-01-23 DIAGNOSIS — G609 Hereditary and idiopathic neuropathy, unspecified: Secondary | ICD-10-CM

## 2016-01-23 DIAGNOSIS — C250 Malignant neoplasm of head of pancreas: Secondary | ICD-10-CM

## 2016-01-23 DIAGNOSIS — R110 Nausea: Secondary | ICD-10-CM

## 2016-01-23 DIAGNOSIS — T451X5A Adverse effect of antineoplastic and immunosuppressive drugs, initial encounter: Secondary | ICD-10-CM

## 2016-01-23 LAB — COMPREHENSIVE METABOLIC PANEL
ALT: 15 U/L (ref 0–55)
AST: 13 U/L (ref 5–34)
Albumin: 3.1 g/dL — ABNORMAL LOW (ref 3.5–5.0)
Alkaline Phosphatase: 144 U/L (ref 40–150)
Anion Gap: 9 mEq/L (ref 3–11)
BUN: 5.5 mg/dL — AB (ref 7.0–26.0)
CALCIUM: 9 mg/dL (ref 8.4–10.4)
CHLORIDE: 102 meq/L (ref 98–109)
CO2: 28 meq/L (ref 22–29)
CREATININE: 0.7 mg/dL (ref 0.7–1.3)
EGFR: >90 mL/min/{1.73_m2} (ref >90)
Glucose: 136 mg/dl (ref 70–140)
Potassium: 4 mEq/L (ref 3.5–5.1)
Sodium: 140 mEq/L (ref 136–145)
Total Bilirubin: 0.41 mg/dL (ref 0.20–1.20)
Total Protein: 6.4 g/dL (ref 6.4–8.3)

## 2016-01-23 LAB — CBC WITH DIFFERENTIAL/PLATELET
BASO%: 0.1 % (ref 0.0–2.0)
BASOS ABS: 0 10*3/uL (ref 0.0–0.1)
EOS%: 5.3 % (ref 0.0–7.0)
Eosinophils Absolute: 0.2 10*3/uL (ref 0.0–0.5)
HEMATOCRIT: 37 % — AB (ref 38.4–49.9)
HGB: 12.3 g/dL — ABNORMAL LOW (ref 13.0–17.1)
LYMPH#: 0.4 10*3/uL — AB (ref 0.9–3.3)
LYMPH%: 12.3 % — ABNORMAL LOW (ref 14.0–49.0)
MCH: 30 pg (ref 27.2–33.4)
MCHC: 33.3 g/dL (ref 32.0–36.0)
MCV: 90.2 fL (ref 79.3–98.0)
MONO#: 0.4 10*3/uL (ref 0.1–0.9)
MONO%: 13.5 % (ref 0.0–14.0)
NEUT%: 68.8 % (ref 39.0–75.0)
NEUTROS ABS: 2.2 10*3/uL (ref 1.5–6.5)
Platelets: 116 10*3/uL — ABNORMAL LOW (ref 140–400)
RBC: 4.1 10*6/uL — AB (ref 4.20–5.82)
RDW: 12.9 % (ref 11.0–14.6)
WBC: 3.2 10*3/uL — ABNORMAL LOW (ref 4.0–10.3)

## 2016-01-23 MED ORDER — SODIUM CHLORIDE 0.9 % IV SOLN
Freq: Once | INTRAVENOUS | Status: AC
  Administered 2016-01-23: 10:00:00 via INTRAVENOUS

## 2016-01-23 MED ORDER — LORAZEPAM 0.5 MG PO TABS: 0.5000 mg | ORAL_TABLET | ORAL | Status: DC | PRN

## 2016-01-23 MED ORDER — PROCHLORPERAZINE MALEATE 10 MG PO TABS
ORAL_TABLET | ORAL | Status: AC
  Filled 2016-01-23: qty 1

## 2016-01-23 MED ORDER — SODIUM CHLORIDE 0.9% FLUSH
10.0000 mL | INTRAVENOUS | Status: DC | PRN
  Administered 2016-01-23: 10 mL
  Filled 2016-01-23: qty 10

## 2016-01-23 MED ORDER — HEPARIN SOD (PORK) LOCK FLUSH 100 UNIT/ML IV SOLN
500.0000 [IU] | Freq: Once | INTRAVENOUS | Status: AC | PRN
  Administered 2016-01-23: 500 [IU]
  Filled 2016-01-23: qty 5

## 2016-01-23 MED ORDER — SODIUM CHLORIDE 0.9 % IV SOLN
1000.0000 mg/m2 | Freq: Once | INTRAVENOUS | Status: AC
  Administered 2016-01-23: 1786 mg via INTRAVENOUS
  Filled 2016-01-23: qty 46.97

## 2016-01-23 MED ORDER — PACLITAXEL PROTEIN-BOUND CHEMO INJECTION 100 MG
125.0000 mg/m2 | Freq: Once | INTRAVENOUS | Status: AC
  Administered 2016-01-23: 225 mg via INTRAVENOUS
  Filled 2016-01-23: qty 45

## 2016-01-23 MED ORDER — PROCHLORPERAZINE MALEATE 10 MG PO TABS
10.0000 mg | ORAL_TABLET | Freq: Once | ORAL | Status: AC
  Administered 2016-01-23: 10 mg via ORAL

## 2016-01-23 NOTE — Progress Notes (Signed)
  Roy English OFFICE PROGRESS NOTE   Diagnosis: Pancreas cancer  INTERVAL HISTORY:   Roy English returns as scheduled. He began treatment with gemcitabine/Abraxane on 01/09/2016. No fever or rash with the chemotherapy. He reports mild tingling and numbness in the feet. No numbness in the hands. This does not interfere with activity. He has noted "yellow "urine. No urinary frequency or burning. His pain is controlled with Dilaudid every 6 hours. He has gained weight.  Objective:  Vital signs in last 24 hours:  Blood pressure 99/66, pulse 78, temperature 97.9 F (36.6 C), temperature source Oral, resp. rate 18, height 5\' 9"  (1.753 m), weight 148 lb 14.4 oz (67.541 kg), SpO2 100 %.    HEENT: No thrush or ulcers Resp: Coarse rales at the posterior basis, no respiratory distress Cardio: Regular rate and rhythm GI: Nontender, no hepatomegaly, no mass Vascular: No leg edema  Skin: Resolving rash over the trunk and extremities   Portacath/PICC-without erythema  Lab Results:  Lab Results  Component Value Date   WBC 3.2* 01/23/2016   HGB 12.3* 01/23/2016   HCT 37.0* 01/23/2016   MCV 90.2 01/23/2016   PLT 116* 01/23/2016   NEUTROABS 2.2 01/23/2016   CA 19-9 on 01/16/2016-3176  Medications: I have reviewed the patient's current medications.  Assessment/Plan: 1. Pancreas cancer, clinical T3 N0, pancreas head mass, status post an EUS biopsy at [facility] on 09/17/2015 confirming a poorly differentiate a carcinoma  ultrasound staging 09/17/2015, T2 N0  CT chest at [facility] 09/29/2015 with patchy groundglass opacities throughout both lungs, nonspecific 5 mm right lung nodule  MRI liver 09/07/2015 revealed a pancreas head mass with bile duct obstruction, no focal liver lesions, no lymphadenopathy  CT abdomen/pelvis 09/21/2015-pancreas head mass with intra-and extrahepatic bile duct dilation despite placement of a stent, suspicion for SMV involvement  Xeloda/radiation started  10/15/2015  Xeloda placed on hold beginning 11/07/2015 due to progression of skin rash  Xeloda resumed at a reduced dose of 500 mg twice daily 11/10/2015  Xeloda discontinued 11/19/2015  Radiation completed 11/24/2015  Restaging CTs 12/24/2015 with enlargement of the pancreas head mass and development of liver metastases  Gemcitabine/Abraxane day 1/day 8/day 15 schedule of a 28 day cycle, Started 01/09/2016  2. Idiopathic interstitial fibrosis of the lungs  3. Pain secondary to #1  4. Weight loss  5. 14 mm enhancing posterior right renal mass on CT 09/21/2015 suspicious for a renal cell carcinoma.  6. Rash on trunk and extremities secondary to Xeloda. 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 Progressive 11/07/2015. Xeloda placed on hold. Resumed at reduced dose 11/10/2015. Discontinued 11/19/2015.  7. Port-A-Cath placement 01/07/2016, interventional radiology   Disposition:  Roy English appears to be tolerating the gemcitabine/Abraxane well. He will complete cycle one today. He will return for an office visit and chemotherapy in 2 weeks.  Roy English will contact us for increased numbness/tingling in the feet. This would be an unusual early manifestation of Abraxane toxicity.  Betsy Coder, MD  01/23/2016  10:10 AM

## 2016-01-23 NOTE — Telephone Encounter (Signed)
Gave and printed appt sched and avs for pt for July  °

## 2016-01-23 NOTE — Patient Instructions (Signed)
[facility] Discharge Instructions for Patients Receiving Chemotherapy  Today you received the following chemotherapy agents:  Abraxane, Gemzar  To help prevent nausea and vomiting after your treatment, we encourage you to take your nausea medication as prescribed.   If you develop nausea and vomiting that is not controlled by your nausea medication, call the clinic.   BELOW ARE SYMPTOMS THAT SHOULD BE REPORTED IMMEDIATELY:  *FEVER GREATER THAN 100.5 F  *CHILLS WITH OR WITHOUT FEVER  NAUSEA AND VOMITING THAT IS NOT CONTROLLED WITH YOUR NAUSEA MEDICATION  *UNUSUAL SHORTNESS OF BREATH  *UNUSUAL BRUISING OR BLEEDING  TENDERNESS IN MOUTH AND THROAT WITH OR WITHOUT PRESENCE OF ULCERS  *URINARY PROBLEMS  *BOWEL PROBLEMS  UNUSUAL RASH Items with * indicate a potential emergency and should be followed up as soon as possible.  Feel free to call the clinic you have any questions or concerns. The clinic phone number is [phone].  Please show the CHEMO ALERT CARD at check-in to the Emergency Department and triage nurse.   

## 2016-01-27 NOTE — Progress Notes (Signed)
  Radiation Oncology         [phone] ________________________________  Name: Roy English MRN: [id number]  Date: 11/24/2015  DOB: May 22, 1951  End of Treatment Note  Diagnosis:   Pancreatic cancer     Indication for treatment::  curative       Radiation treatment dates:   10/15/2015 through 11/24/2015  Site/dose:   The patient was treated with a simultaneous integrated boost technique using an IMRT plan. The patient received 56 gray in 28 fractions to the high dose region and simultaneously received 50.4 gray to the lower dose region in 28 fractions. Concurrent chemotherapy was given.  Narrative: The patient tolerated radiation treatment relatively well.     Plan: The patient has completed radiation treatment. The patient will return to radiation oncology clinic for routine followup in one month. I advised the patient to call or return sooner if they have any questions or concerns related to their recovery or treatment. ________________________________  Jodelle Gross, M.D., Ph.D.

## 2016-02-06 ENCOUNTER — Other Ambulatory Visit: Payer: Self-pay | Admitting: Nurse Practitioner

## 2016-02-06 ENCOUNTER — Other Ambulatory Visit (HOSPITAL_BASED_OUTPATIENT_CLINIC_OR_DEPARTMENT_OTHER): Payer: 59

## 2016-02-06 ENCOUNTER — Other Ambulatory Visit: Payer: Self-pay | Admitting: Oncology

## 2016-02-06 ENCOUNTER — Ambulatory Visit (HOSPITAL_BASED_OUTPATIENT_CLINIC_OR_DEPARTMENT_OTHER): Payer: 59

## 2016-02-06 ENCOUNTER — Ambulatory Visit (HOSPITAL_BASED_OUTPATIENT_CLINIC_OR_DEPARTMENT_OTHER): Payer: 59 | Admitting: Nurse Practitioner

## 2016-02-06 ENCOUNTER — Telehealth: Payer: Self-pay | Admitting: Nurse Practitioner

## 2016-02-06 VITALS — BP 90/58 | HR 73 | Temp 97.9°F | Resp 18 | Ht 69.0 in | Wt 148.1 lb

## 2016-02-06 VITALS — BP 91/61 | HR 58

## 2016-02-06 DIAGNOSIS — I959 Hypotension, unspecified: Secondary | ICD-10-CM | POA: Diagnosis not present

## 2016-02-06 DIAGNOSIS — C250 Malignant neoplasm of head of pancreas: Secondary | ICD-10-CM | POA: Diagnosis not present

## 2016-02-06 DIAGNOSIS — Z5111 Encounter for antineoplastic chemotherapy: Secondary | ICD-10-CM | POA: Diagnosis not present

## 2016-02-06 DIAGNOSIS — G893 Neoplasm related pain (acute) (chronic): Secondary | ICD-10-CM | POA: Diagnosis not present

## 2016-02-06 LAB — COMPREHENSIVE METABOLIC PANEL
ALK PHOS: 218 U/L — AB (ref 40–150)
ALT: 11 U/L (ref 0–55)
ANION GAP: 9 meq/L (ref 3–11)
AST: 16 U/L (ref 5–34)
Albumin: 3 g/dL — ABNORMAL LOW (ref 3.5–5.0)
BILIRUBIN TOTAL: 0.38 mg/dL (ref 0.20–1.20)
BUN: 7.1 mg/dL (ref 7.0–26.0)
CALCIUM: 8.9 mg/dL (ref 8.4–10.4)
CO2: 28 mEq/L (ref 22–29)
CREATININE: 0.7 mg/dL (ref 0.7–1.3)
Chloride: 102 mEq/L (ref 98–109)
EGFR: >90 mL/min/{1.73_m2} (ref >90)
Glucose: 122 mg/dl (ref 70–140)
Potassium: 4.1 mEq/L (ref 3.5–5.1)
Sodium: 139 mEq/L (ref 136–145)
TOTAL PROTEIN: 6.4 g/dL (ref 6.4–8.3)

## 2016-02-06 LAB — CBC WITH DIFFERENTIAL/PLATELET
BASO%: 0.2 % (ref 0.0–2.0)
Basophils Absolute: 0 10*3/uL (ref 0.0–0.1)
EOS%: 4.9 % (ref 0.0–7.0)
Eosinophils Absolute: 0.2 10*3/uL (ref 0.0–0.5)
HCT: 34.8 % — ABNORMAL LOW (ref 38.4–49.9)
HEMOGLOBIN: 11.7 g/dL — AB (ref 13.0–17.1)
LYMPH%: 10.7 % — ABNORMAL LOW (ref 14.0–49.0)
MCH: 30.2 pg (ref 27.2–33.4)
MCHC: 33.6 g/dL (ref 32.0–36.0)
MCV: 89.9 fL (ref 79.3–98.0)
MONO#: 0.8 10*3/uL (ref 0.1–0.9)
MONO%: 18.6 % — AB (ref 0.0–14.0)
NEUT%: 65.6 % (ref 39.0–75.0)
NEUTROS ABS: 2.8 10*3/uL (ref 1.5–6.5)
Platelets: 307 10*3/uL (ref 140–400)
RBC: 3.87 10*6/uL — ABNORMAL LOW (ref 4.20–5.82)
RDW: 13.1 % (ref 11.0–14.6)
WBC: 4.3 10*3/uL (ref 4.0–10.3)
lymph#: 0.5 10*3/uL — ABNORMAL LOW (ref 0.9–3.3)

## 2016-02-06 MED ORDER — LIDOCAINE-PRILOCAINE 2.5-2.5 % EX CREA
TOPICAL_CREAM | CUTANEOUS | Status: AC
  Filled 2016-02-06: qty 5

## 2016-02-06 MED ORDER — PROCHLORPERAZINE MALEATE 10 MG PO TABS
10.0000 mg | ORAL_TABLET | Freq: Once | ORAL | Status: AC
  Administered 2016-02-06: 10 mg via ORAL

## 2016-02-06 MED ORDER — SODIUM CHLORIDE 0.9 % IV SOLN
Freq: Once | INTRAVENOUS | Status: AC
  Administered 2016-02-06: 14:00:00 via INTRAVENOUS

## 2016-02-06 MED ORDER — HYDROMORPHONE HCL 2 MG PO TABS: 2.0000 mg | ORAL_TABLET | ORAL | Status: DC | PRN

## 2016-02-06 MED ORDER — SODIUM CHLORIDE 0.9% FLUSH
10.0000 mL | INTRAVENOUS | Status: DC | PRN
  Administered 2016-02-06: 10 mL
  Filled 2016-02-06: qty 10

## 2016-02-06 MED ORDER — ONDANSETRON HCL 8 MG PO TABS
8.0000 mg | ORAL_TABLET | Freq: Once | ORAL | Status: AC
  Administered 2016-02-06: 8 mg via ORAL

## 2016-02-06 MED ORDER — PROCHLORPERAZINE MALEATE 10 MG PO TABS
ORAL_TABLET | ORAL | Status: AC
  Filled 2016-02-06: qty 1

## 2016-02-06 MED ORDER — HEPARIN SOD (PORK) LOCK FLUSH 100 UNIT/ML IV SOLN
500.0000 [IU] | Freq: Once | INTRAVENOUS | Status: AC | PRN
  Administered 2016-02-06: 500 [IU]
  Filled 2016-02-06: qty 5

## 2016-02-06 MED ORDER — PACLITAXEL PROTEIN-BOUND CHEMO INJECTION 100 MG
125.0000 mg/m2 | Freq: Once | INTRAVENOUS | Status: AC
  Administered 2016-02-06: 225 mg via INTRAVENOUS
  Filled 2016-02-06: qty 45

## 2016-02-06 MED ORDER — ONDANSETRON HCL 8 MG PO TABS
ORAL_TABLET | ORAL | Status: AC
  Filled 2016-02-06: qty 1

## 2016-02-06 MED ORDER — SODIUM CHLORIDE 0.9 % IV SOLN
1000.0000 mg/m2 | Freq: Once | INTRAVENOUS | Status: AC
  Administered 2016-02-06: 1786 mg via INTRAVENOUS
  Filled 2016-02-06: qty 46.97

## 2016-02-06 NOTE — Patient Instructions (Signed)
[facility] Discharge Instructions for Patients Receiving Chemotherapy  Today you received the following chemotherapy agents:  Abraxane and Gemzar.  To help prevent nausea and vomiting after your treatment, we encourage you to take your nausea medication as prescribed.   If you develop nausea and vomiting that is not controlled by your nausea medication, call the clinic.   BELOW ARE SYMPTOMS THAT SHOULD BE REPORTED IMMEDIATELY:  *FEVER GREATER THAN 100.5 F  *CHILLS WITH OR WITHOUT FEVER  NAUSEA AND VOMITING THAT IS NOT CONTROLLED WITH YOUR NAUSEA MEDICATION  *UNUSUAL SHORTNESS OF BREATH  *UNUSUAL BRUISING OR BLEEDING  TENDERNESS IN MOUTH AND THROAT WITH OR WITHOUT PRESENCE OF ULCERS  *URINARY PROBLEMS  *BOWEL PROBLEMS  UNUSUAL RASH Items with * indicate a potential emergency and should be followed up as soon as possible.  Feel free to call the clinic you have any questions or concerns. The clinic phone number is [phone].  Please show the CHEMO ALERT CARD at check-in to the Emergency Department and triage nurse.   

## 2016-02-06 NOTE — Progress Notes (Signed)
[place] OFFICE PROGRESS NOTE   Diagnosis:  Pancreas cancer  INTERVAL HISTORY:   Roy English returns as scheduled. He completed cycle 1 day 15 gemcitabine/Abraxane 01/23/2016. He noted a decrease in his energy level following treatment. He had "mild" diarrhea, 2 or 3 loose stools a day for a few days. He also reports a "low-grade fever" for a few days. He has mild intermittent nausea. No mouth sores. No rash. He reports good fluid intake and a good appetite. No lightheadedness or dizziness except occasionally with quick position change. He takes no blood pressure medication. He denies significant pain. He has continued to take Dilaudid regularly due to fear of recurrent pain. No shortness of breath. He has an occasional slight cough. He reports overall he feels well.  Objective:  Vital signs in last 24 hours:  Blood pressure 90/58, pulse 73, temperature 97.9 F (36.6 C), temperature source Oral, resp. rate 18, height 5\' 9"  (1.753 m), weight 148 lb 1.6 oz (67.178 kg), SpO2 99 %.    HEENT: No thrush or ulcers. Resp: Faint rales at both lung bases. No respiratory distress. Cardio: Regular rate and rhythm. GI: Abdomen soft and nontender. No hepatomegaly. No mass. Vascular: No leg edema. Neuro: Alert and oriented.  Skin: Resolving rash over the chart and extremities. Port-A-Cath without erythema.    Lab Results:  Lab Results  Component Value Date   WBC 4.3 02/06/2016   HGB 11.7* 02/06/2016   HCT 34.8* 02/06/2016   MCV 89.9 02/06/2016   PLT 307 02/06/2016   NEUTROABS 2.8 02/06/2016    Imaging:  No results found.  Medications: I have reviewed the patient's current medications.  Assessment/Plan: 1. Pancreas cancer, clinical T3 N0, pancreas head mass, status post an EUS biopsy at [facility] on 09/17/2015 confirming a poorly differentiate a carcinoma  ultrasound staging 09/17/2015, T2 N0  CT chest at [facility] 09/29/2015 with patchy groundglass opacities throughout both  lungs, nonspecific 5 mm right lung nodule  MRI liver 09/07/2015 revealed a pancreas head mass with bile duct obstruction, no focal liver lesions, no lymphadenopathy  CT abdomen/pelvis 09/21/2015-pancreas head mass with intra-and extrahepatic bile duct dilation despite placement of a stent, suspicion for SMV involvement  Xeloda/radiation started 10/15/2015  Xeloda placed on hold beginning 11/07/2015 due to progression of skin rash  Xeloda resumed at a reduced dose of 500 mg twice daily 11/10/2015  Xeloda discontinued 11/19/2015  Radiation completed 11/24/2015  Restaging CTs 12/24/2015 with enlargement of the pancreas head mass and development of liver metastases  Gemcitabine/Abraxane day 1/day 8/day 15 schedule of a 28 day cycle, Started 01/09/2016  Cycle 2 gemcitabine/Abraxane day one/day 8/day 15 schedule of a 28 day cycle, started 02/06/2016  2. Idiopathic interstitial fibrosis of the lungs  3. Pain secondary to #1  4. Weight loss  5. 14 mm enhancing posterior right renal mass on CT 09/21/2015 suspicious for a renal cell carcinoma.  6. Rash on trunk and extremities secondary to Xeloda. 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 Progressive 11/07/2015. Xeloda placed on hold. Resumed at reduced dose 11/10/2015. Discontinued 11/19/2015.  7. Port-A-Cath placement 01/07/2016, interventional radiology   Disposition: [name] appears stable. He has completed 1 cycle of gemcitabine/Abraxane on a day one/day 8/day 15 schedule of a 28 day cycle. Plan to proceed with cycle 2 day 1 today as scheduled.  He has no significant pain. We discussed taking the Dilaudid on an as-needed basis only. He is in agreement.  He was hypotensive on  arrival to the office. The hypotension may be related to pain medication he took a few hours ago. He is not ill appearing and does not appear dehydrated. We will repeat his blood pressure in the infusion area.  He will return in one  week for the cycle 2 day 8 treatment. We will see him in follow-up in 2 weeks. He will contact the office in the interim with any problems.  Plan reviewed with Dr. Benay Spice. 25 minutes were spent face-to-face at today's visit with the majority of that time involved in counseling/coordination of care.    Ned Card ANP/GNP-BC   02/06/2016  12:14 PM

## 2016-02-06 NOTE — Telephone Encounter (Signed)
per pof to sch pt appt-gave pt copy of avs °

## 2016-02-07 LAB — CANCER ANTIGEN 19-9

## 2016-02-10 ENCOUNTER — Telehealth: Payer: Self-pay | Admitting: *Deleted

## 2016-02-10 MED ORDER — OMEPRAZOLE 20 MG PO CPDR: 20.0000 mg | DELAYED_RELEASE_CAPSULE | Freq: Every day | ORAL | Status: DC

## 2016-02-10 NOTE — Telephone Encounter (Signed)
Message from pt's wife reporting reflux, indigestion and nausea. Returned call to pt, he reports indigestion. He is eating soft foods, which has helped. Mylanta has also helped. Asking if there is anything else he should take for this. He denies any mouth sores. Reviewed with Ned Card, NP: Have pt try Prilosec OTC 20 mg daily. Called pt with instructions. He voiced understanding.

## 2016-02-13 ENCOUNTER — Other Ambulatory Visit (HOSPITAL_BASED_OUTPATIENT_CLINIC_OR_DEPARTMENT_OTHER): Payer: 59

## 2016-02-13 ENCOUNTER — Ambulatory Visit (HOSPITAL_BASED_OUTPATIENT_CLINIC_OR_DEPARTMENT_OTHER): Payer: 59

## 2016-02-13 ENCOUNTER — Other Ambulatory Visit: Payer: Self-pay | Admitting: Oncology

## 2016-02-13 VITALS — BP 103/73 | HR 77 | Temp 98.0°F | Resp 18

## 2016-02-13 DIAGNOSIS — C250 Malignant neoplasm of head of pancreas: Secondary | ICD-10-CM | POA: Diagnosis not present

## 2016-02-13 DIAGNOSIS — Z5111 Encounter for antineoplastic chemotherapy: Secondary | ICD-10-CM | POA: Diagnosis not present

## 2016-02-13 LAB — CBC WITH DIFFERENTIAL/PLATELET
BASO%: 0.5 % (ref 0.0–2.0)
BASOS ABS: 0 10*3/uL (ref 0.0–0.1)
EOS ABS: 0.2 10*3/uL (ref 0.0–0.5)
EOS%: 5.4 % (ref 0.0–7.0)
HCT: 34.6 % — ABNORMAL LOW (ref 38.4–49.9)
HEMOGLOBIN: 11.5 g/dL — AB (ref 13.0–17.1)
LYMPH%: 10.7 % — AB (ref 14.0–49.0)
MCH: 29.7 pg (ref 27.2–33.4)
MCHC: 33.2 g/dL (ref 32.0–36.0)
MCV: 89.4 fL (ref 79.3–98.0)
MONO#: 0.5 10*3/uL (ref 0.1–0.9)
MONO%: 12.7 % (ref 0.0–14.0)
NEUT#: 3 10*3/uL (ref 1.5–6.5)
NEUT%: 70.7 % (ref 39.0–75.0)
Platelets: 189 10*3/uL (ref 140–400)
RBC: 3.87 10*6/uL — AB (ref 4.20–5.82)
RDW: 13.1 % (ref 11.0–14.6)
WBC: 4.3 10*3/uL (ref 4.0–10.3)
lymph#: 0.5 10*3/uL — ABNORMAL LOW (ref 0.9–3.3)

## 2016-02-13 LAB — COMPREHENSIVE METABOLIC PANEL
ALBUMIN: 3.1 g/dL — AB (ref 3.5–5.0)
ALK PHOS: 181 U/L — AB (ref 40–150)
ALT: 11 U/L (ref 0–55)
AST: 15 U/L (ref 5–34)
Anion Gap: 7 mEq/L (ref 3–11)
BUN: 7.2 mg/dL (ref 7.0–26.0)
CHLORIDE: 103 meq/L (ref 98–109)
CO2: 30 meq/L — AB (ref 22–29)
Calcium: 8.9 mg/dL (ref 8.4–10.4)
Creatinine: 0.7 mg/dL (ref 0.7–1.3)
GLUCOSE: 138 mg/dL (ref 70–140)
POTASSIUM: 4.4 meq/L (ref 3.5–5.1)
SODIUM: 140 meq/L (ref 136–145)
Total Bilirubin: 0.4 mg/dL (ref 0.20–1.20)
Total Protein: 6.3 g/dL — ABNORMAL LOW (ref 6.4–8.3)

## 2016-02-13 MED ORDER — HEPARIN SOD (PORK) LOCK FLUSH 100 UNIT/ML IV SOLN
500.0000 [IU] | Freq: Once | INTRAVENOUS | Status: AC | PRN
  Administered 2016-02-13: 500 [IU]
  Filled 2016-02-13: qty 5

## 2016-02-13 MED ORDER — SODIUM CHLORIDE 0.9 % IV SOLN
Freq: Once | INTRAVENOUS | Status: AC
  Administered 2016-02-13: 13:00:00 via INTRAVENOUS

## 2016-02-13 MED ORDER — SODIUM CHLORIDE 0.9% FLUSH
10.0000 mL | INTRAVENOUS | Status: DC | PRN
  Administered 2016-02-13: 10 mL
  Filled 2016-02-13: qty 10

## 2016-02-13 MED ORDER — PROCHLORPERAZINE MALEATE 10 MG PO TABS
10.0000 mg | ORAL_TABLET | Freq: Once | ORAL | Status: AC
  Administered 2016-02-13: 10 mg via ORAL

## 2016-02-13 MED ORDER — PACLITAXEL PROTEIN-BOUND CHEMO INJECTION 100 MG
125.0000 mg/m2 | Freq: Once | INTRAVENOUS | Status: AC
  Administered 2016-02-13: 225 mg via INTRAVENOUS
  Filled 2016-02-13: qty 45

## 2016-02-13 MED ORDER — PROCHLORPERAZINE MALEATE 10 MG PO TABS
ORAL_TABLET | ORAL | Status: AC
  Filled 2016-02-13: qty 1

## 2016-02-13 MED ORDER — GEMCITABINE HCL CHEMO INJECTION 1 GM/26.3ML
1000.0000 mg/m2 | Freq: Once | INTRAVENOUS | Status: AC
  Administered 2016-02-13: 1786 mg via INTRAVENOUS
  Filled 2016-02-13: qty 46.97

## 2016-02-13 NOTE — Patient Instructions (Signed)
[facility] Discharge Instructions for Patients Receiving Chemotherapy  Today you received the following chemotherapy agents Abraxane and Gemzar.  To help prevent nausea and vomiting after your treatment, we encourage you to take your nausea medication.   If you develop nausea and vomiting that is not controlled by your nausea medication, call the clinic.   BELOW ARE SYMPTOMS THAT SHOULD BE REPORTED IMMEDIATELY:  *FEVER GREATER THAN 100.5 F  *CHILLS WITH OR WITHOUT FEVER  NAUSEA AND VOMITING THAT IS NOT CONTROLLED WITH YOUR NAUSEA MEDICATION  *UNUSUAL SHORTNESS OF BREATH  *UNUSUAL BRUISING OR BLEEDING  TENDERNESS IN MOUTH AND THROAT WITH OR WITHOUT PRESENCE OF ULCERS  *URINARY PROBLEMS  *BOWEL PROBLEMS  UNUSUAL RASH Items with * indicate a potential emergency and should be followed up as soon as possible.  Feel free to call the clinic you have any questions or concerns. The clinic phone number is [phone].  Please show the CHEMO ALERT CARD at check-in to the Emergency Department and triage nurse.   

## 2016-02-14 ENCOUNTER — Other Ambulatory Visit: Payer: Self-pay | Admitting: Oncology

## 2016-02-17 ENCOUNTER — Other Ambulatory Visit: Payer: Self-pay | Admitting: Oncology

## 2016-02-20 ENCOUNTER — Telehealth: Payer: Self-pay | Admitting: Oncology

## 2016-02-20 ENCOUNTER — Other Ambulatory Visit (HOSPITAL_BASED_OUTPATIENT_CLINIC_OR_DEPARTMENT_OTHER): Payer: 59

## 2016-02-20 ENCOUNTER — Ambulatory Visit (HOSPITAL_BASED_OUTPATIENT_CLINIC_OR_DEPARTMENT_OTHER): Payer: 59 | Admitting: Nurse Practitioner

## 2016-02-20 ENCOUNTER — Encounter: Payer: Self-pay | Admitting: Nurse Practitioner

## 2016-02-20 ENCOUNTER — Ambulatory Visit (HOSPITAL_BASED_OUTPATIENT_CLINIC_OR_DEPARTMENT_OTHER): Payer: 59

## 2016-02-20 VITALS — BP 94/72 | HR 73 | Temp 97.9°F | Resp 16

## 2016-02-20 DIAGNOSIS — G893 Neoplasm related pain (acute) (chronic): Secondary | ICD-10-CM

## 2016-02-20 DIAGNOSIS — Z5111 Encounter for antineoplastic chemotherapy: Secondary | ICD-10-CM

## 2016-02-20 DIAGNOSIS — C787 Secondary malignant neoplasm of liver and intrahepatic bile duct: Secondary | ICD-10-CM | POA: Diagnosis not present

## 2016-02-20 DIAGNOSIS — D696 Thrombocytopenia, unspecified: Secondary | ICD-10-CM | POA: Diagnosis not present

## 2016-02-20 DIAGNOSIS — C250 Malignant neoplasm of head of pancreas: Secondary | ICD-10-CM | POA: Diagnosis not present

## 2016-02-20 DIAGNOSIS — C259 Malignant neoplasm of pancreas, unspecified: Secondary | ICD-10-CM

## 2016-02-20 LAB — CBC WITH DIFFERENTIAL/PLATELET
BASO%: 0.3 % (ref 0.0–2.0)
Basophils Absolute: 0 10*3/uL (ref 0.0–0.1)
EOS%: 9.3 % — AB (ref 0.0–7.0)
Eosinophils Absolute: 0.3 10*3/uL (ref 0.0–0.5)
HEMATOCRIT: 32.5 % — AB (ref 38.4–49.9)
HGB: 10.7 g/dL — ABNORMAL LOW (ref 13.0–17.1)
LYMPH#: 0.3 10*3/uL — AB (ref 0.9–3.3)
LYMPH%: 8.6 % — AB (ref 14.0–49.0)
MCH: 29.3 pg (ref 27.2–33.4)
MCHC: 33 g/dL (ref 32.0–36.0)
MCV: 88.9 fL (ref 79.3–98.0)
MONO#: 0.4 10*3/uL (ref 0.1–0.9)
MONO%: 10.6 % (ref 0.0–14.0)
NEUT%: 71.2 % (ref 39.0–75.0)
NEUTROS ABS: 2.6 10*3/uL (ref 1.5–6.5)
Platelets: 111 10*3/uL — ABNORMAL LOW (ref 140–400)
RBC: 3.65 10*6/uL — AB (ref 4.20–5.82)
RDW: 13.6 % (ref 11.0–14.6)
WBC: 3.6 10*3/uL — ABNORMAL LOW (ref 4.0–10.3)

## 2016-02-20 MED ORDER — SODIUM CHLORIDE 0.9 % IV SOLN
Freq: Once | INTRAVENOUS | Status: AC
  Administered 2016-02-20: 12:00:00 via INTRAVENOUS

## 2016-02-20 MED ORDER — PROCHLORPERAZINE MALEATE 10 MG PO TABS
10.0000 mg | ORAL_TABLET | Freq: Once | ORAL | Status: AC
  Administered 2016-02-20: 10 mg via ORAL

## 2016-02-20 MED ORDER — PACLITAXEL PROTEIN-BOUND CHEMO INJECTION 100 MG
125.0000 mg/m2 | Freq: Once | INTRAVENOUS | Status: AC
  Administered 2016-02-20: 225 mg via INTRAVENOUS
  Filled 2016-02-20: qty 45

## 2016-02-20 MED ORDER — PROCHLORPERAZINE MALEATE 10 MG PO TABS
ORAL_TABLET | ORAL | Status: AC
  Filled 2016-02-20: qty 1

## 2016-02-20 MED ORDER — SODIUM CHLORIDE 0.9 % IV SOLN
1000.0000 mg/m2 | Freq: Once | INTRAVENOUS | Status: AC
  Administered 2016-02-20: 1786 mg via INTRAVENOUS
  Filled 2016-02-20: qty 46.97

## 2016-02-20 MED ORDER — SODIUM CHLORIDE 0.9% FLUSH
10.0000 mL | INTRAVENOUS | Status: DC | PRN
  Administered 2016-02-20: 10 mL
  Filled 2016-02-20: qty 10

## 2016-02-20 MED ORDER — HEPARIN SOD (PORK) LOCK FLUSH 100 UNIT/ML IV SOLN
500.0000 [IU] | Freq: Once | INTRAVENOUS | Status: AC | PRN
  Administered 2016-02-20: 500 [IU]
  Filled 2016-02-20: qty 5

## 2016-02-20 NOTE — Progress Notes (Signed)
  [place] OFFICE PROGRESS NOTE   Diagnosis:  Pancreas cancer  INTERVAL HISTORY:   Roy English returns as scheduled. He completed cycle 2 day 8 gemcitabine/Abraxane 02/13/2016. He had some mild nausea and 2 episodes of vomiting. Compazine effective. He also had a few episodes of diarrhea. No mouth ulcers. He does note that the tip of his tongue is "sore". Abdominal pain continues to be improved. He states that he has "little to no pain". He continues taking scheduled Dilaudid.  Objective:  Vital signs in last 24 hours:  Temperature 97.9, heart rate 73, respirations 16, blood pressure 94/72    HEENT: Tip of tongue is erythematous. No ulcerations. Resp: Lungs clear bilaterally. Cardio: Regular rate and rhythm. GI: Abdomen soft and nontender. No hepatomegaly. No mass. Vascular: No leg edema. Skin: Resolving rash over the chest and extremities. Port-A-Cath without erythema.  Lab Results:  Lab Results  Component Value Date   WBC 3.6* 02/20/2016   HGB 10.7* 02/20/2016   HCT 32.5* 02/20/2016   MCV 88.9 02/20/2016   PLT 111* 02/20/2016   NEUTROABS 2.6 02/20/2016    Imaging:  No results found.  Medications: I have reviewed the patient's current medications.  Assessment/Plan: 1. Pancreas cancer, clinical T3 N0, pancreas head mass, status post an EUS biopsy at [facility] on 09/17/2015 confirming a poorly differentiate a carcinoma  ultrasound staging 09/17/2015, T2 N0  CT chest at [facility] 09/29/2015 with patchy groundglass opacities throughout both lungs, nonspecific 5 mm right lung nodule  MRI liver 09/07/2015 revealed a pancreas head mass with bile duct obstruction, no focal liver lesions, no lymphadenopathy  CT abdomen/pelvis 09/21/2015-pancreas head mass with intra-and extrahepatic bile duct dilation despite placement of a stent, suspicion for SMV involvement  Xeloda/radiation started 10/15/2015  Xeloda placed on hold beginning 11/07/2015 due to progression of skin  rash  Xeloda resumed at a reduced dose of 500 mg twice daily 11/10/2015  Xeloda discontinued 11/19/2015  Radiation completed 11/24/2015  Restaging CTs 12/24/2015 with enlargement of the pancreas head mass and development of liver metastases  Gemcitabine/Abraxane day 1/day 8/day 15 schedule of a 28 day cycle, Started 01/09/2016  Cycle 2 gemcitabine/Abraxane day one/day 8/day 15 schedule of a 28 day cycle, started 02/06/2016  2. Idiopathic interstitial fibrosis of the lungs  3. Pain secondary to #1  4. Weight loss  5. 14 mm enhancing posterior right renal mass on CT 09/21/2015 suspicious for a renal cell carcinoma.  6. Rash on trunk and extremities secondary to Xeloda. 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 Progressive 11/07/2015. Xeloda placed on hold. Resumed at reduced dose 11/10/2015. Discontinued 11/19/2015.  7. Port-A-Cath placement 01/07/2016, interventional radiology   Disposition: Mr. Poniatowski appears stable. The CA-19-9 was improved after completion of 1 cycle of gemcitabine/Abraxane. Plan to proceed with cycle 2 day 15 gemcitabine/Abraxane today as scheduled.  He has mild thrombocytopenia on labs today. He understands to contact the office with any bleeding.  His pain is significantly better. We again discussed decreasing use of pain medicine. He will try decreasing use of Dilaudid as tolerated based on pain.  He will return for a follow-up visit and cycle 3 day 1 gemcitabine/Abraxane 03/05/2016. He will contact the office in the interim as outlined above or with any other problems.    Ned Card ANP/GNP-BC   02/20/2016  12:15 PM

## 2016-02-20 NOTE — Patient Instructions (Signed)
[facility] Discharge Instructions for Patients Receiving Chemotherapy  Today you received the following chemotherapy agents: Abraxane and Gemzar   To help prevent nausea and vomiting after your treatment, we encourage you to take your nausea medication as directed.    If you develop nausea and vomiting that is not controlled by your nausea medication, call the clinic.   BELOW ARE SYMPTOMS THAT SHOULD BE REPORTED IMMEDIATELY:  *FEVER GREATER THAN 100.5 F  *CHILLS WITH OR WITHOUT FEVER  NAUSEA AND VOMITING THAT IS NOT CONTROLLED WITH YOUR NAUSEA MEDICATION  *UNUSUAL SHORTNESS OF BREATH  *UNUSUAL BRUISING OR BLEEDING  TENDERNESS IN MOUTH AND THROAT WITH OR WITHOUT PRESENCE OF ULCERS  *URINARY PROBLEMS  *BOWEL PROBLEMS  UNUSUAL RASH Items with * indicate a potential emergency and should be followed up as soon as possible.  Feel free to call the clinic you have any questions or concerns. The clinic phone number is [phone].  Please show the CHEMO ALERT CARD at check-in to the Emergency Department and triage nurse.   

## 2016-02-20 NOTE — Telephone Encounter (Signed)
Gave pt cal & avs, pt could not do 8/4 @ 8 as requested.. Pt needed later apt due to travel time

## 2016-02-22 ENCOUNTER — Emergency Department (HOSPITAL_COMMUNITY)
Admission: EM | Admit: 2016-02-22 | Discharge: 2016-02-22 | Disposition: A | Discharge status: Home / Self Care | Payer: 59 | Attending: Emergency Medicine | Admitting: Emergency Medicine

## 2016-02-22 ENCOUNTER — Encounter (HOSPITAL_COMMUNITY): Payer: Self-pay | Admitting: Emergency Medicine

## 2016-02-22 ENCOUNTER — Emergency Department (HOSPITAL_COMMUNITY): Payer: 59

## 2016-02-22 DIAGNOSIS — R509 Fever, unspecified: Secondary | ICD-10-CM | POA: Diagnosis present

## 2016-02-22 DIAGNOSIS — R05 Cough: Secondary | ICD-10-CM | POA: Insufficient documentation

## 2016-02-22 LAB — URINALYSIS, ROUTINE W REFLEX MICROSCOPIC
BILIRUBIN URINE: NEGATIVE
Glucose, UA: NEGATIVE mg/dL
Hgb urine dipstick: NEGATIVE
KETONES UR: NEGATIVE mg/dL
LEUKOCYTES UA: NEGATIVE
NITRITE: NEGATIVE
Protein, ur: NEGATIVE mg/dL
Specific Gravity, Urine: 1.005 (ref 1.005–1.030)
pH: 7 (ref 5.0–8.0)

## 2016-02-22 LAB — CBC WITH DIFFERENTIAL/PLATELET
Basophils Absolute: 0 10*3/uL (ref 0.0–0.1)
Basophils Relative: 0 %
EOS PCT: 10 %
Eosinophils Absolute: 0.4 10*3/uL (ref 0.0–0.7)
HCT: 31.7 % — ABNORMAL LOW (ref 39.0–52.0)
HEMOGLOBIN: 10.4 g/dL — AB (ref 13.0–17.0)
LYMPHS ABS: 0.2 10*3/uL — AB (ref 0.7–4.0)
LYMPHS PCT: 6 %
MCH: 29.6 pg (ref 26.0–34.0)
MCHC: 32.8 g/dL (ref 30.0–36.0)
MCV: 90.3 fL (ref 78.0–100.0)
MONOS PCT: 3 %
Monocytes Absolute: 0.1 10*3/uL (ref 0.1–1.0)
Neutro Abs: 3.1 10*3/uL (ref 1.7–7.7)
Neutrophils Relative %: 81 %
PLATELETS: 129 10*3/uL — AB (ref 150–400)
RBC: 3.51 MIL/uL — AB (ref 4.22–5.81)
RDW: 13.2 % (ref 11.5–15.5)
WBC: 3.8 10*3/uL — AB (ref 4.0–10.5)

## 2016-02-22 LAB — COMPREHENSIVE METABOLIC PANEL
ALBUMIN: 3.4 g/dL — AB (ref 3.5–5.0)
ALK PHOS: 132 U/L — AB (ref 38–126)
ALT: 15 U/L — ABNORMAL LOW (ref 17–63)
ANION GAP: 4 — AB (ref 5–15)
AST: 21 U/L (ref 15–41)
BUN: 5 mg/dL — ABNORMAL LOW (ref 6–20)
CALCIUM: 8.4 mg/dL — AB (ref 8.9–10.3)
CHLORIDE: 103 mmol/L (ref 101–111)
CO2: 29 mmol/L (ref 22–32)
Creatinine, Ser: 0.51 mg/dL — ABNORMAL LOW (ref 0.61–1.24)
GFR calc non Af Amer: >60 mL/min (ref >60)
GLUCOSE: 95 mg/dL (ref 65–99)
POTASSIUM: 3.8 mmol/L (ref 3.5–5.1)
SODIUM: 136 mmol/L (ref 135–145)
Total Bilirubin: 0.9 mg/dL (ref 0.3–1.2)
Total Protein: 6.2 g/dL — ABNORMAL LOW (ref 6.5–8.1)

## 2016-02-22 LAB — I-STAT CG4 LACTIC ACID, ED: Lactic Acid, Venous: 0.92 mmol/L (ref 0.5–1.9)

## 2016-02-22 MED ORDER — CIPROFLOXACIN HCL 500 MG PO TABS
500.0000 mg | ORAL_TABLET | Freq: Once | ORAL | Status: AC
  Administered 2016-02-22: 500 mg via ORAL
  Filled 2016-02-22: qty 1

## 2016-02-22 MED ORDER — METRONIDAZOLE 500 MG PO TABS: 500.0000 mg | ORAL_TABLET | Freq: Two times a day (BID) | ORAL | 0 refills | Status: DC

## 2016-02-22 MED ORDER — METRONIDAZOLE 500 MG PO TABS
500.0000 mg | ORAL_TABLET | Freq: Once | ORAL | Status: AC
  Administered 2016-02-22: 500 mg via ORAL
  Filled 2016-02-22: qty 1

## 2016-02-22 MED ORDER — SODIUM CHLORIDE 0.9 % IV BOLUS (SEPSIS)
1000.0000 mL | Freq: Once | INTRAVENOUS | Status: AC
  Administered 2016-02-22: 1000 mL via INTRAVENOUS

## 2016-02-22 MED ORDER — CIPROFLOXACIN HCL 500 MG PO TABS: 500.0000 mg | ORAL_TABLET | Freq: Two times a day (BID) | ORAL | 0 refills | Status: DC

## 2016-02-22 NOTE — ED Triage Notes (Addendum)
Pt states he had chemo for pancreatic CA on Friday and has had intermittent fever since.  Tylenol given but it has since stopped bringing the fever down. Told by Dr. Gearldine Shown office to come in. Alert and oriented.Last tylenol at 5:30pm.

## 2016-02-22 NOTE — ED Provider Notes (Signed)
[facility] DEPT Provider Note   CSN: [id number] Arrival date & time: 02/22/16  2003  First Provider Contact:  First MD Initiated Contact with Patient 02/22/16 2030        History   Chief Complaint Chief Complaint - fever  HPI Roy English is a 65 y.o. male.  The history is provided by the patient, the spouse and a relative.  Fever   The incident occurred 2 days ago. The pain is moderate. The pain has been intermittent since the injury. Associated symptoms include weakness. Pertinent negatives include no vomiting. He has tried acetaminophen for the symptoms. The treatment provided moderate relief.  Patient presents for fever He has h/o pancreatitic CA, currently on chemotherapy He reports since last treatment 2 days ago, he has had intermittent fever that responds to APAP No vomiting/diarrhea No cp/sob He has mild cough No new rash (reports small sacral wound) He has mild HA He does not have worsening abd pain No dysuria   Past Medical History:  Diagnosis Date  . Actinic keratosis   . Arthritis   . 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 Dilated cbd, acquired   . ED (erectile dysfunction)   . Elevated LFTs   . Environmental allergies   . Idiopathic interstitial fibrosis of lung syndrome ([place])   . S/P radiation therapy 11/24/15 completed    pancreas 50.4Gy    Patient Active Problem List   Diagnosis Date Noted  . 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 Malignant neoplasm of head of pancreas ([place]) 10/03/2015  . Biliary stent migration   . Acute abdominal pain 09/21/2015  . Hypokalemia 09/21/2015  . Common bile duct stricture   . Pancreatic mass   . Elevated liver enzymes 09/01/2015  . Interstitial lung disease ([place]) 06/22/2012    Past Surgical History:  Procedure Laterality Date  . [facility] Separation Surgery Bilateral   . BILIARY STENT PLACEMENT N/A 09/23/2015   Procedure: BILIARY STENT PLACEMENT;  Surgeon: Milus Banister, MD;  Location: [facility] ENDOSCOPY;  Service: Endoscopy;  Laterality: N/A;  . carpal tunnel surgery Bilateral   . ERCP N/A 09/08/2015   Procedure: ENDOSCOPIC RETROGRADE CHOLANGIOPANCREATOGRAPHY (ERCP);  Surgeon: Gatha Mayer, MD;  Location: [facility];  Service: Endoscopy;  Laterality: N/A;  . ERCP N/A 09/23/2015   Procedure: ENDOSCOPIC RETROGRADE CHOLANGIOPANCREATOGRAPHY (ERCP);  Surgeon: Milus Banister, MD;  Location: [facility];  Service: Endoscopy;  Laterality: N/A;  with stent exchange  . KNEE ARTHROSCOPY Right   . multiple surgeries     ribs,arms,nose,fingers  . skin cancer removal      face and chest       Home Medications    Prior to Admission medications   Medication Sig Start Date End Date Taking? Authorizing Provider  HYDROmorphone (DILAUDID) 2 MG tablet Take 1-2 tablets (2-4 mg total) by mouth every 4 (four) hours as needed for severe pain. 02/06/16   Owens Shark, NP  omeprazole (PRILOSEC) 20 MG capsule Take 1 capsule (20 mg total) by mouth daily. 02/10/16   Owens Shark, NP  ondansetron (ZOFRAN) 8 MG tablet Take 1 tablet (8 mg total) by mouth every 6 (six) hours as needed for nausea or vomiting. Patient not taking: Reported on 02/06/2016 01/12/16   Ladell Pier, MD  Pancrelipase, Lip-Prot-Amyl, 3000-9500 units CPEP Take 1 capsule by mouth 3 (three) times daily with meals.  09/29/15 09/28/16  Historical Provider, MD  polyethylene glycol (MIRALAX / GLYCOLAX) packet  Take 17 g by mouth daily. Reported on 02/06/2016    Historical Provider, MD  prochlorperazine (COMPAZINE) 10 MG tablet TAKE 1 TABLET BY MOUTH EVERY 6 HOURS AS NEEDED FOR NAUSEA OR VOMITING 02/17/16   Ladell Pier, MD  senna-docusate (SENNA S) 8.6-50 MG tablet Take 1 tablet by mouth 2 (two) times daily. Patient not taking: Reported on 02/06/2016 10/03/15   Ladell Pier, MD  sildenafil (VIAGRA) 100 MG tablet Take 1 tablet (100 mg total) by mouth daily as needed for erectile dysfunction. Patient not taking: Reported  on 02/06/2016 07/10/15   Guadalupe Maple, MD    Family History Family History  Problem Relation Age of Onset  . Heart disease Mother   . Rheum arthritis Mother   . Cancer      both sides of the family, type unknown    Social History Social History  Substance Use Topics  . Smoking status: Never Smoker  . Smokeless tobacco: Never Used  . Alcohol use 0.0 oz/week     Comment: 6 beer daily-heavy on the weekends     Allergies   Penicillins   Review of Systems Review of Systems  Constitutional: Positive for fatigue and fever.  Respiratory: Positive for cough.   Cardiovascular: Negative for chest pain.  Gastrointestinal: Negative for diarrhea and vomiting.  Genitourinary: Negative for dysuria.  Neurological: Positive for weakness.  All other systems reviewed and are negative.    Physical Exam Updated Vital Signs BP 112/80 (BP Location: Right Arm)   Pulse 99   Temp 99.1 F (37.3 C) (Oral)   Resp 18   SpO2 99%   Physical Exam  CONSTITUTIONAL: Chronically ill appearing HEAD: Normocephalic/atraumatic EYES: EOMI/PERRL ENMT: Mucous membranes dry NECK: supple no meningeal signs SPINE/BACK:entire spine nontender CV: S1/S2 noted, no murmurs/rubs/gallops noted LUNGS: crackles bilaterally, no apparent distress ABDOMEN: soft, nontender, no rebound or guarding, bowel sounds noted throughout abdomen GU:no cva tenderness NEURO: Pt is awake/alert/appropriate, moves all extremitiesx4.  No facial droop.   EXTREMITIES: pulses normal/equal, full ROM SKIN: warm, color normal, small area of erythema to sacrum, no abscess/drainage/cellulitis noted PSYCH: no abnormalities of mood noted, alert and oriented to situation  ED Treatments / Results  Labs (all labs ordered are listed, but only abnormal results are displayed) Labs Reviewed  COMPREHENSIVE METABOLIC PANEL - Abnormal; Notable for the following:       Result Value   BUN 5 (*)    Creatinine, Ser 0.51 (*)    Calcium 8.4 (*)      Total Protein 6.2 (*)    Albumin 3.4 (*)    ALT 15 (*)    Alkaline Phosphatase 132 (*)    Anion gap 4 (*)    All other components within normal limits  CBC WITH DIFFERENTIAL/PLATELET - Abnormal; Notable for the following:    WBC 3.8 (*)    RBC 3.51 (*)    Hemoglobin 10.4 (*)    HCT 31.7 (*)    Platelets 129 (*)    Lymphs Abs 0.2 (*)    All other components within normal limits  CULTURE, BLOOD (ROUTINE X 2)  CULTURE, BLOOD (ROUTINE X 2)  URINE CULTURE  URINALYSIS, ROUTINE W REFLEX MICROSCOPIC (NOT AT [facility])  I-STAT CG4 LACTIC ACID, ED  I-STAT CG4 LACTIC ACID, ED    EKG  EKG Interpretation None       Radiology Dg Chest 2 View  Result Date: 02/22/2016 CLINICAL DATA:  Recent chemotherapy for pancreatic cancer with intermittent fever EXAM:  CHEST  2 VIEW COMPARISON:  09/21/2015; chest CT - 12/24/2015 FINDINGS: Grossly unchanged cardiac silhouette and mediastinal contours. Interval placement of a right anterior chest wall internal jugular approach port a catheter with tip terminating over the superior cavoatrial junction. Ill-defined reticular opacities within in the right upper lung as well as the bilateral lung bases are grossly unchanged. No new focal airspace opacities. No pleural effusion or pneumothorax. No evidence of edema. No acute osseus abnormalities. A stent overlies expected location of the CBD. IMPRESSION: 1. No acute cardiopulmonary disease. Specifically, no new focal airspace opacities to suggest pneumonia. 2. Grossly unchanged ill-defined reticular opacities within the right upper lung and bilateral lung bases, similar to chest CT performed 12/24/2015 and likely indicative of fibrotic change. 3. Interval placement of right anterior chest wall internal jugular approach port a catheter with tip projected over the superior cavoatrial junction Electronically Signed   By: Sandi Mariscal M.D.   On: 02/22/2016 20:32   Procedures Procedures (including critical care  time)  Medications Ordered in ED Medications  sodium chloride 0.9 % bolus 1,000 mL (1,000 mLs Intravenous New Bag/Given 02/22/16 2239)  ciprofloxacin (CIPRO) tablet 500 mg (500 mg Oral Given 02/22/16 2303)  metroNIDAZOLE (FLAGYL) tablet 500 mg (500 mg Oral Given 02/22/16 2303)     Initial Impression / Assessment and Plan / ED Course  I have reviewed the triage vital signs and the nursing notes.  Pertinent labs & imaging results that were available during my care of the patient were reviewed by me and considered in my medical decision making (see chart for details).  Clinical Course  Value Comment By Time  CO2: 29 (Reviewed) Ripley Fraise, MD 07/23 2154   8:55 PM Pt with h/o pancreatic CA on chemo here with fever He is well appearing He is not toxic Workup pending at this time 11:18 PM  Labs reassuring I spoke to dr Alvy Bimler on call We reviewed history/exam and labs/imaging Pt is improved He is not septic appearing No signs of meningitis She recommends due to h/o CBD stent, recommend starting augmentn (pt with PCN allergy, will start cipro/flagyl)  Discussed return precautions Pt/family agreeable with plan Final Clinical Impressions(s) / ED Diagnoses   Final diagnoses:  Acute febrile illness    New Prescriptions New Prescriptions   CIPROFLOXACIN (CIPRO) 500 MG TABLET    Take 1 tablet (500 mg total) by mouth 2 (two) times daily. One po bid x 7 days   METRONIDAZOLE (FLAGYL) 500 MG TABLET    Take 1 tablet (500 mg total) by mouth 2 (two) times daily.     Ripley Fraise, MD 02/22/16 [phone]

## 2016-02-23 ENCOUNTER — Telehealth: Payer: Self-pay | Admitting: *Deleted

## 2016-02-23 NOTE — Telephone Encounter (Signed)
Call received from patients sister, Butch Penny to inform Dr. Benay Spice that pt was in the ER last night for a fever of 100.7.  Pt received IVF's and a prescription for Cipro and Flagyl.  Per Dr. Benay Spice, pt does not need to continue Cipro and Flagyl at this time if pt is not having fevers.  Pt.'s wife informed of MD instructions and verbalizes an understanding of information.  She states that pt is not running and fevers at this time and will notify this office if fevers continue.

## 2016-02-24 LAB — URINE CULTURE: Culture: <10000 — AB

## 2016-02-27 LAB — CULTURE, BLOOD (ROUTINE X 2)
CULTURE: NO GROWTH
CULTURE: NO GROWTH

## 2016-02-29 ENCOUNTER — Other Ambulatory Visit: Payer: Self-pay | Admitting: Oncology

## 2016-03-05 ENCOUNTER — Ambulatory Visit (HOSPITAL_BASED_OUTPATIENT_CLINIC_OR_DEPARTMENT_OTHER): Payer: 59

## 2016-03-05 ENCOUNTER — Telehealth: Payer: Self-pay | Admitting: Oncology

## 2016-03-05 ENCOUNTER — Other Ambulatory Visit (HOSPITAL_BASED_OUTPATIENT_CLINIC_OR_DEPARTMENT_OTHER): Payer: 59

## 2016-03-05 ENCOUNTER — Ambulatory Visit (HOSPITAL_BASED_OUTPATIENT_CLINIC_OR_DEPARTMENT_OTHER): Payer: 59 | Admitting: Nurse Practitioner

## 2016-03-05 VITALS — BP 113/68 | HR 78 | Resp 17 | Ht 69.0 in | Wt 146.5 lb

## 2016-03-05 DIAGNOSIS — C250 Malignant neoplasm of head of pancreas: Secondary | ICD-10-CM

## 2016-03-05 DIAGNOSIS — R634 Abnormal weight loss: Secondary | ICD-10-CM | POA: Diagnosis not present

## 2016-03-05 DIAGNOSIS — Z5111 Encounter for antineoplastic chemotherapy: Secondary | ICD-10-CM

## 2016-03-05 DIAGNOSIS — G893 Neoplasm related pain (acute) (chronic): Secondary | ICD-10-CM

## 2016-03-05 DIAGNOSIS — R509 Fever, unspecified: Secondary | ICD-10-CM

## 2016-03-05 DIAGNOSIS — C787 Secondary malignant neoplasm of liver and intrahepatic bile duct: Secondary | ICD-10-CM

## 2016-03-05 DIAGNOSIS — C259 Malignant neoplasm of pancreas, unspecified: Secondary | ICD-10-CM

## 2016-03-05 LAB — COMPREHENSIVE METABOLIC PANEL
ALT: 9 U/L (ref 0–55)
AST: 18 U/L (ref 5–34)
Albumin: 3.1 g/dL — ABNORMAL LOW (ref 3.5–5.0)
Alkaline Phosphatase: 195 U/L — ABNORMAL HIGH (ref 40–150)
Anion Gap: 8 mEq/L (ref 3–11)
BUN: 4.9 mg/dL — AB (ref 7.0–26.0)
CHLORIDE: 103 meq/L (ref 98–109)
CO2: 28 mEq/L (ref 22–29)
Calcium: 9 mg/dL (ref 8.4–10.4)
Creatinine: 0.7 mg/dL (ref 0.7–1.3)
GLUCOSE: 158 mg/dL — AB (ref 70–140)
POTASSIUM: 4 meq/L (ref 3.5–5.1)
SODIUM: 139 meq/L (ref 136–145)
Total Bilirubin: 0.41 mg/dL (ref 0.20–1.20)
Total Protein: 6.4 g/dL (ref 6.4–8.3)

## 2016-03-05 LAB — CBC WITH DIFFERENTIAL/PLATELET
BASO%: 0.4 % (ref 0.0–2.0)
Basophils Absolute: 0 10*3/uL (ref 0.0–0.1)
EOS%: 8.4 % — AB (ref 0.0–7.0)
Eosinophils Absolute: 0.3 10*3/uL (ref 0.0–0.5)
HCT: 36.3 % — ABNORMAL LOW (ref 38.4–49.9)
HGB: 11.7 g/dL — ABNORMAL LOW (ref 13.0–17.1)
LYMPH%: 11.3 % — AB (ref 14.0–49.0)
MCH: 29.4 pg (ref 27.2–33.4)
MCHC: 32.3 g/dL (ref 32.0–36.0)
MCV: 91.1 fL (ref 79.3–98.0)
MONO#: 0.7 10*3/uL (ref 0.1–0.9)
MONO%: 17.8 % — ABNORMAL HIGH (ref 0.0–14.0)
NEUT#: 2.5 10*3/uL (ref 1.5–6.5)
NEUT%: 62.1 % (ref 39.0–75.0)
Platelets: 356 10*3/uL (ref 140–400)
RBC: 3.99 10*6/uL — AB (ref 4.20–5.82)
RDW: 15.8 % — ABNORMAL HIGH (ref 11.0–14.6)
WBC: 4 10*3/uL (ref 4.0–10.3)
lymph#: 0.5 10*3/uL — ABNORMAL LOW (ref 0.9–3.3)

## 2016-03-05 MED ORDER — HYDROMORPHONE HCL 2 MG PO TABS: 2.0000 mg | ORAL_TABLET | Freq: Four times a day (QID) | ORAL | 0 refills | Status: DC | PRN

## 2016-03-05 MED ORDER — SODIUM CHLORIDE 0.9 % IV SOLN
Freq: Once | INTRAVENOUS | Status: AC
  Administered 2016-03-05: 16:00:00 via INTRAVENOUS

## 2016-03-05 MED ORDER — HYDROMORPHONE HCL 2 MG PO TABS: 2.0000 mg | ORAL_TABLET | ORAL | 0 refills | Status: DC | PRN

## 2016-03-05 MED ORDER — PROCHLORPERAZINE MALEATE 10 MG PO TABS
ORAL_TABLET | ORAL | Status: AC
  Filled 2016-03-05: qty 1

## 2016-03-05 MED ORDER — PROCHLORPERAZINE MALEATE 10 MG PO TABS
10.0000 mg | ORAL_TABLET | Freq: Once | ORAL | Status: AC
  Administered 2016-03-05: 10 mg via ORAL

## 2016-03-05 MED ORDER — SODIUM CHLORIDE 0.9 % IV SOLN: Freq: Once | INTRAVENOUS | Status: DC

## 2016-03-05 MED ORDER — PACLITAXEL PROTEIN-BOUND CHEMO INJECTION 100 MG
125.0000 mg/m2 | Freq: Once | INTRAVENOUS | Status: AC
  Administered 2016-03-05: 225 mg via INTRAVENOUS
  Filled 2016-03-05: qty 45

## 2016-03-05 MED ORDER — SODIUM CHLORIDE 0.9% FLUSH
10.0000 mL | INTRAVENOUS | Status: DC | PRN
  Filled 2016-03-05: qty 10

## 2016-03-05 MED ORDER — SODIUM CHLORIDE 0.9 % IV SOLN
1000.0000 mg/m2 | Freq: Once | INTRAVENOUS | Status: AC
  Administered 2016-03-05: 1786 mg via INTRAVENOUS
  Filled 2016-03-05: qty 46.97

## 2016-03-05 MED ORDER — HEPARIN SOD (PORK) LOCK FLUSH 100 UNIT/ML IV SOLN
500.0000 [IU] | Freq: Once | INTRAVENOUS | Status: DC | PRN
  Filled 2016-03-05: qty 5

## 2016-03-05 MED ORDER — SODIUM CHLORIDE 0.9 % IV SOLN
Freq: Once | INTRAVENOUS | Status: DC
  Filled 2016-03-05: qty 4

## 2016-03-05 NOTE — Patient Instructions (Signed)
Cancer Center Discharge Instructions for Patients Receiving Chemotherapy  Today you received the following chemotherapy agents Abraxane/Gemzar.  To help prevent nausea and vomiting after your treatment, we encourage you to take your nausea medication as directed.   If you develop nausea and vomiting that is not controlled by your nausea medication, call the clinic.   BELOW ARE SYMPTOMS THAT SHOULD BE REPORTED IMMEDIATELY:  *FEVER GREATER THAN 100.5 F  *CHILLS WITH OR WITHOUT FEVER  NAUSEA AND VOMITING THAT IS NOT CONTROLLED WITH YOUR NAUSEA MEDICATION  *UNUSUAL SHORTNESS OF BREATH  *UNUSUAL BRUISING OR BLEEDING  TENDERNESS IN MOUTH AND THROAT WITH OR WITHOUT PRESENCE OF ULCERS  *URINARY PROBLEMS  *BOWEL PROBLEMS  UNUSUAL RASH Items with * indicate a potential emergency and should be followed up as soon as possible.  Feel free to call the clinic you have any questions or concerns. The clinic phone number is [phone].  Please show the CHEMO ALERT CARD at check-in to the Emergency Department and triage nurse.    

## 2016-03-05 NOTE — Progress Notes (Addendum)
[facility] OFFICE PROGRESS NOTE   Diagnosis:  Pancreas cancer  INTERVAL HISTORY:   Roy English returns as scheduled. He completed cycle 2 day 15 Abraxane/gemcitabine 02/20/2016. He was seen in the emergency department 02/22/2016 for evaluation of a fever. Blood cultures and urine culture negative. Chest x-ray negative for pneumonia. He was started on a course of antibiotics. Dr. Benay Spice recommended discontinuation of the antibiotic the following day.  Roy English had no further fever after leaving the emergency department. No nausea or vomiting this week. No mouth sores. He had loose stools for one or 2 days last week. He notes persistent numbness in his toes and over the balls of his feet. No associated pain. No numbness in his fingertips. No difficulty with ambulation. He reports improvement in abdominal pain. He is taking Dilaudid as needed. He estimates taking 1 or maybe 2 doses a day. He was significantly fatigued following the week 3 treatment. He would like to delete the week 3 treatment from the regimen.  Objective:  Vital signs in last 24 hours:  Blood pressure 113/68, pulse 78, resp. rate 17, height 5\' 9"  (1.753 m), weight 146 lb 8 oz (66.5 kg), SpO2 100 %.    HEENT: No thrush or ulcers. Resp: Lungs clear bilaterally. Cardio: Regular rate and rhythm. GI: Abdomen soft and nontender. No hepatomegaly. No mass. Vascular: No leg edema. Neuro: Vibratory sense moderately decreased over the fingertips per tuning fork exam.  Skin: Resolving rash over the chest and extremities. Port-A-Cath without erythema.    Lab Results:  Lab Results  Component Value Date   WBC 4.0 03/05/2016   HGB 11.7 (L) 03/05/2016   HCT 36.3 (L) 03/05/2016   MCV 91.1 03/05/2016   PLT 356 03/05/2016   NEUTROABS 2.5 03/05/2016    Imaging:  No results found.  Medications: I have reviewed the patient's current medications.  Assessment/Plan: 1. Pancreas cancer, clinical T3 N0, pancreas head  mass, status post an EUS biopsy at [facility] on 09/17/2015 confirming a poorly differentiate a carcinoma  ultrasound staging 09/17/2015, T2 N0  CT chest at [facility] 09/29/2015 with patchy groundglass opacities throughout both lungs, nonspecific 5 mm right lung nodule  MRI liver 09/07/2015 revealed a pancreas head mass with bile duct obstruction, no focal liver lesions, no lymphadenopathy  CT abdomen/pelvis 09/21/2015-pancreas head mass with intra-and extrahepatic bile duct dilation despite placement of a stent, suspicion for SMV involvement  Xeloda/radiation started 10/15/2015  Xeloda placed on hold beginning 11/07/2015 due to progression of skin rash  Xeloda resumed at a reduced dose of 500 mg twice daily 11/10/2015  Xeloda discontinued 11/19/2015  Radiation completed 11/24/2015  Restaging CTs 12/24/2015 with enlargement of the pancreas head mass and development of liver metastases  Gemcitabine/Abraxane day 1/day 8/day 15 schedule of a 28 day cycle, Started 01/09/2016  Cycle 2 gemcitabine/Abraxane day one/day 8/day 15 schedule of a 28 day cycle (02/06/2016, 02/13/2016, 02/20/2016)  Gemcitabine/Abraxane schedule changed to every 2 weeks due to neuropathy and fatigue  2. Idiopathic interstitial fibrosis of the lungs  3. Pain secondary to #1  4. Weight loss  5. 14 mm enhancing posterior right renal mass on CT 09/21/2015 suspicious for a renal cell carcinoma.  6. Rash on trunk and extremities secondary to Xeloda. 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 Progressive 11/07/2015. Xeloda placed on hold. Resumed at reduced dose 11/10/2015. Discontinued 11/19/2015.  7. Port-A-Cath placement 01/07/2016, interventional radiology  8.    Fever 02/22/2016 status post evaluation in the  emergency department. Chest x-ray and cultures negative. Fever likely related to gemcitabine.   Disposition: Roy English appears stable. He has completed 2 cycles of gemcitabine/Abraxane  on a day one/day 8/day 15 schedule of a 28 day cycle. His performance status has improved. Pain is better. Plan to continue gemcitabine/Abraxane but will adjust the schedule to every 2 weeks beginning today due to neuropathy and fatigue. He will return for a follow-up visit and treatment in 2 weeks. He will contact the office in the interim with any problems.  Patient seen with Dr. Benay Spice.    Ned Card ANP/GNP-BC   03/05/2016  3:03 PM This was a shared visit with Ned Card. Roy English appears to be tolerating the gemcitabine/Abraxane well and his clinical status has improved. The plan is to begin a third cycle today. He will undergo a restaging CT evaluation after this cycle.  Julieanne Manson, M.D.

## 2016-03-05 NOTE — Telephone Encounter (Signed)
Gave pt cal & avs °

## 2016-03-06 LAB — CANCER ANTIGEN 19-9: CAN 19-9: 958 U/mL — AB (ref 0–35)

## 2016-03-08 ENCOUNTER — Telehealth: Payer: Self-pay | Admitting: *Deleted

## 2016-03-08 ENCOUNTER — Telehealth: Payer: Self-pay

## 2016-03-08 MED ORDER — ONDANSETRON HCL 40 MG/20ML IJ SOLN
Freq: Once | INTRAMUSCULAR | Status: AC
  Administered 2016-03-05: 18:00:00 via INTRAVENOUS

## 2016-03-08 NOTE — Addendum Note (Signed)
Addended by: Henreitta Leber E on: 03/08/2016 09:29 AM   Modules accepted: Orders

## 2016-03-08 NOTE — Telephone Encounter (Signed)
-----   Message from Owens Shark, NP sent at 03/08/2016  9:07 AM EDT ----- Please let him know CA-19-9 is better.

## 2016-03-08 NOTE — Telephone Encounter (Signed)
Call from pt's sister Butch Penny reporting schedule needs to be adjusted. Pt is to be treated every other week. Reviewed with Ned Card, confirmed 8/11 appts should be canceled. Same done. She also inquired about CT appt. Informed her CT will be ordered at next visit. She voiced understanding.

## 2016-03-08 NOTE — Telephone Encounter (Signed)
Called and informed pt of ca-19.9 results. Pt verbalized understanding and denies any questions or concerns at this time.

## 2016-03-12 ENCOUNTER — Other Ambulatory Visit: Payer: Self-pay | Admitting: Oncology

## 2016-03-12 ENCOUNTER — Ambulatory Visit: Payer: 59

## 2016-03-12 ENCOUNTER — Other Ambulatory Visit: Payer: 59

## 2016-03-12 DIAGNOSIS — C250 Malignant neoplasm of head of pancreas: Secondary | ICD-10-CM

## 2016-03-19 ENCOUNTER — Ambulatory Visit (HOSPITAL_BASED_OUTPATIENT_CLINIC_OR_DEPARTMENT_OTHER): Payer: 59

## 2016-03-19 ENCOUNTER — Telehealth: Payer: Self-pay | Admitting: Oncology

## 2016-03-19 ENCOUNTER — Other Ambulatory Visit (HOSPITAL_BASED_OUTPATIENT_CLINIC_OR_DEPARTMENT_OTHER): Payer: 59

## 2016-03-19 ENCOUNTER — Encounter: Payer: Self-pay | Admitting: Nurse Practitioner

## 2016-03-19 ENCOUNTER — Ambulatory Visit (HOSPITAL_BASED_OUTPATIENT_CLINIC_OR_DEPARTMENT_OTHER): Payer: 59 | Admitting: Nurse Practitioner

## 2016-03-19 VITALS — BP 97/63 | HR 82 | Temp 97.8°F | Resp 18 | Ht 69.0 in | Wt 145.8 lb

## 2016-03-19 DIAGNOSIS — C250 Malignant neoplasm of head of pancreas: Secondary | ICD-10-CM

## 2016-03-19 DIAGNOSIS — Z5111 Encounter for antineoplastic chemotherapy: Secondary | ICD-10-CM | POA: Diagnosis not present

## 2016-03-19 DIAGNOSIS — C787 Secondary malignant neoplasm of liver and intrahepatic bile duct: Secondary | ICD-10-CM | POA: Diagnosis not present

## 2016-03-19 DIAGNOSIS — C259 Malignant neoplasm of pancreas, unspecified: Secondary | ICD-10-CM

## 2016-03-19 DIAGNOSIS — R634 Abnormal weight loss: Secondary | ICD-10-CM

## 2016-03-19 LAB — COMPREHENSIVE METABOLIC PANEL
ALT: <9 U/L (ref 0–55)
ANION GAP: 6 meq/L (ref 3–11)
AST: 15 U/L (ref 5–34)
Albumin: 2.9 g/dL — ABNORMAL LOW (ref 3.5–5.0)
Alkaline Phosphatase: 175 U/L — ABNORMAL HIGH (ref 40–150)
BUN: 6.4 mg/dL — ABNORMAL LOW (ref 7.0–26.0)
CALCIUM: 8.8 mg/dL (ref 8.4–10.4)
CO2: 31 mEq/L — ABNORMAL HIGH (ref 22–29)
CREATININE: 0.7 mg/dL (ref 0.7–1.3)
Chloride: 103 mEq/L (ref 98–109)
Glucose: 108 mg/dl (ref 70–140)
POTASSIUM: 3.7 meq/L (ref 3.5–5.1)
Sodium: 140 mEq/L (ref 136–145)
Total Bilirubin: 0.33 mg/dL (ref 0.20–1.20)
Total Protein: 6.2 g/dL — ABNORMAL LOW (ref 6.4–8.3)

## 2016-03-19 LAB — CBC WITH DIFFERENTIAL/PLATELET
BASO%: 0.2 % (ref 0.0–2.0)
BASOS ABS: 0 10*3/uL (ref 0.0–0.1)
EOS%: 8.4 % — ABNORMAL HIGH (ref 0.0–7.0)
Eosinophils Absolute: 0.4 10*3/uL (ref 0.0–0.5)
HEMATOCRIT: 35.7 % — AB (ref 38.4–49.9)
HGB: 11.5 g/dL — ABNORMAL LOW (ref 13.0–17.1)
LYMPH%: 10.5 % — AB (ref 14.0–49.0)
MCH: 29.1 pg (ref 27.2–33.4)
MCHC: 32.1 g/dL (ref 32.0–36.0)
MCV: 90.5 fL (ref 79.3–98.0)
MONO#: 0.7 10*3/uL (ref 0.1–0.9)
MONO%: 15.6 % — ABNORMAL HIGH (ref 0.0–14.0)
NEUT#: 2.9 10*3/uL (ref 1.5–6.5)
NEUT%: 65.3 % (ref 39.0–75.0)
PLATELETS: 214 10*3/uL (ref 140–400)
RBC: 3.95 10*6/uL — AB (ref 4.20–5.82)
RDW: 15.9 % — ABNORMAL HIGH (ref 11.0–14.6)
WBC: 4.5 10*3/uL (ref 4.0–10.3)
lymph#: 0.5 10*3/uL — ABNORMAL LOW (ref 0.9–3.3)

## 2016-03-19 MED ORDER — SODIUM CHLORIDE 0.9 % IV SOLN
Freq: Once | INTRAVENOUS | Status: AC
  Administered 2016-03-19: 13:00:00 via INTRAVENOUS

## 2016-03-19 MED ORDER — SODIUM CHLORIDE 0.9% FLUSH
10.0000 mL | INTRAVENOUS | Status: DC | PRN
  Administered 2016-03-19: 10 mL
  Filled 2016-03-19: qty 10

## 2016-03-19 MED ORDER — PROCHLORPERAZINE MALEATE 10 MG PO TABS
ORAL_TABLET | ORAL | Status: AC
  Filled 2016-03-19: qty 1

## 2016-03-19 MED ORDER — PROCHLORPERAZINE MALEATE 10 MG PO TABS
10.0000 mg | ORAL_TABLET | Freq: Once | ORAL | Status: AC
  Administered 2016-03-19: 10 mg via ORAL

## 2016-03-19 MED ORDER — SODIUM CHLORIDE 0.9 % IV SOLN
1000.0000 mg/m2 | Freq: Once | INTRAVENOUS | Status: AC
  Administered 2016-03-19: 1786 mg via INTRAVENOUS
  Filled 2016-03-19: qty 46.97

## 2016-03-19 MED ORDER — PACLITAXEL PROTEIN-BOUND CHEMO INJECTION 100 MG
125.0000 mg/m2 | Freq: Once | INTRAVENOUS | Status: AC
  Administered 2016-03-19: 225 mg via INTRAVENOUS
  Filled 2016-03-19: qty 45

## 2016-03-19 MED ORDER — HEPARIN SOD (PORK) LOCK FLUSH 100 UNIT/ML IV SOLN
500.0000 [IU] | Freq: Once | INTRAVENOUS | Status: AC | PRN
  Administered 2016-03-19: 500 [IU]
  Filled 2016-03-19: qty 5

## 2016-03-19 NOTE — Progress Notes (Signed)
[facility] OFFICE PROGRESS NOTE   Diagnosis:  Pancreas cancer  INTERVAL HISTORY:   Mr. Unzicker returns as scheduled. He completed another cycle of gemcitabine/Abraxane 03/05/2016. He denies nausea/vomiting. He has had some constipation. He tried an enema with good results. He is taking miralax and dulcolax. No mouth sores. He notes increased numbness involving his feet. The numbness does not interfere with activity. He also notes mild numbness in the fingertips. No fever. Rash continues to improve. Abdominal pain continues to be improved.  Objective:  Vital signs in last 24 hours:  Blood pressure 97/63, pulse 82, temperature 97.8 F (36.6 C), temperature source Oral, resp. rate 18, height 5\' 9"  (1.753 m), weight 145 lb 12.8 oz (66.1 kg), SpO2 99 %.    HEENT: No thrush or ulcers. Resp: Lungs clear bilaterally. Cardio: Regular rate and rhythm.  GI: Abdomen soft and nontender. No pallor megaly. No mass. Vascular: No leg edema. Neuro: Vibratory sense moderately decreased over the fingertips per tuning fork exam.  Skin: Resolving rash over the chest and extremities. Port-A-Cath without erythema.    Lab Results:  Lab Results  Component Value Date   WBC 4.5 03/19/2016   HGB 11.5 (L) 03/19/2016   HCT 35.7 (L) 03/19/2016   MCV 90.5 03/19/2016   PLT 214 03/19/2016   NEUTROABS 2.9 03/19/2016    Imaging:  No results found.  Medications: I have reviewed the patient's current medications.  Assessment/Plan: 1. Pancreas cancer, clinical T3 N0, pancreas head mass, status post an EUS biopsy at [facility] on 09/17/2015 confirming a poorly differentiate a carcinoma  ultrasound staging 09/17/2015, T2 N0  CT chest at [facility] 09/29/2015 with patchy groundglass opacities throughout both lungs, nonspecific 5 mm right lung nodule  MRI liver 09/07/2015 revealed a pancreas head mass with bile duct obstruction, no focal liver lesions, no lymphadenopathy  CT abdomen/pelvis  09/21/2015-pancreas head mass with intra-and extrahepatic bile duct dilation despite placement of a stent, suspicion for SMV involvement  Xeloda/radiation started 10/15/2015  Xeloda placed on hold beginning 11/07/2015 due to progression of skin rash  Xeloda resumed at a reduced dose of 500 mg twice daily 11/10/2015  Xeloda discontinued 11/19/2015  Radiation completed 11/24/2015  Restaging CTs 12/24/2015 with enlargement of the pancreas head mass and development of liver metastases  Gemcitabine/Abraxane day 1/day 8/day 15 schedule of a 28 day cycle, Started 01/09/2016  Cycle 2 gemcitabine/Abraxane day one/day 8/day 15 schedule of a 28 day cycle (02/06/2016, 02/13/2016, 02/20/2016)  Gemcitabine/Abraxane schedule changed to every 2 weeks due to neuropathy and fatigue  CA-19-9 improved 03/05/2016  2. Idiopathic interstitial fibrosis of the lungs  3. Pain secondary to #1  4. Weight loss  5. 14 mm enhancing posterior right renal mass on CT 09/21/2015 suspicious for a renal cell carcinoma.  6. Rash on trunk and extremities secondary to Xeloda. 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 Progressive 11/07/2015. Xeloda placed on hold. Resumed at reduced dose 11/10/2015. Discontinued 11/19/2015.  7. Port-A-Cath placement 01/07/2016, interventional radiology  8.    Fever 02/22/2016 status post evaluation in the emergency department. Chest x-ray and cultures negative. Fever likely related to gemcitabine.  9.    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   Disposition: Mr. Torrez appears stable. His pain continues to be improved. Plan to continue gemcitabine/Abraxane every 2 weeks.   He has persistent numbness in the fingertips and feet. This is likely related to Abraxane. The numbness does not interfere with activity. We  will continue to monitor.   We will obtain restaging CT scans 03/30/2016. He will return for a follow-up  visit on 04/02/2016.  Plan reviewed with Dr. Benay Spice.  Ned Card ANP/GNP-BC   03/19/2016  12:02 PM

## 2016-03-19 NOTE — Patient Instructions (Signed)
Cancer Center Discharge Instructions for Patients Receiving Chemotherapy  Today you received the following chemotherapy agents: Abraxane and Gemzar   To help prevent nausea and vomiting after your treatment, we encourage you to take your nausea medication as directed.    If you develop nausea and vomiting that is not controlled by your nausea medication, call the clinic.   BELOW ARE SYMPTOMS THAT SHOULD BE REPORTED IMMEDIATELY:  *FEVER GREATER THAN 100.5 F  *CHILLS WITH OR WITHOUT FEVER  NAUSEA AND VOMITING THAT IS NOT CONTROLLED WITH YOUR NAUSEA MEDICATION  *UNUSUAL SHORTNESS OF BREATH  *UNUSUAL BRUISING OR BLEEDING  TENDERNESS IN MOUTH AND THROAT WITH OR WITHOUT PRESENCE OF ULCERS  *URINARY PROBLEMS  *BOWEL PROBLEMS  UNUSUAL RASH Items with * indicate a potential emergency and should be followed up as soon as possible.  Feel free to call the clinic you have any questions or concerns. The clinic phone number is [phone].  Please show the CHEMO ALERT CARD at check-in to the Emergency Department and triage nurse.   

## 2016-03-19 NOTE — Telephone Encounter (Signed)
Gave pt cal & avs °

## 2016-03-20 LAB — CANCER ANTIGEN 19-9: CAN 19-9: 955 U/mL — AB (ref 0–35)

## 2016-03-28 ENCOUNTER — Other Ambulatory Visit: Payer: Self-pay | Admitting: Oncology

## 2016-03-29 ENCOUNTER — Telehealth: Payer: Self-pay | Admitting: *Deleted

## 2016-03-29 NOTE — Telephone Encounter (Signed)
Message received from pt.'s sister regarding CT scan appt.  Phone call and email sent to [facility] inquiring about approval for CT.  Return call placed to pt.'s wife to inform her of information.  Pt.'s wife appreciative of call.

## 2016-03-30 ENCOUNTER — Telehealth: Payer: Self-pay | Admitting: *Deleted

## 2016-03-30 NOTE — Telephone Encounter (Signed)
Spouse called asking "why scans cannot be scheduled through [facility].  I've called insurance.  [employer] says there is no need for authorization.  Something's gone awry and he needs this scan before Friday to determine what chemotherapy he'll need to receive."  Return number [phone]. Called [place] for Help and will notify them of results. Return call from [place].  "This does require authorization, has been submitted and pending review so I will check tomorrow."  Will notify provider.  Called spouse also with this information.

## 2016-04-02 ENCOUNTER — Ambulatory Visit (HOSPITAL_COMMUNITY)
Admission: RE | Admit: 2016-04-02 | Discharge: 2016-04-02 | Disposition: A | Discharge status: Home / Self Care | Payer: 59 | Source: Ambulatory Visit | Attending: Nurse Practitioner | Admitting: Nurse Practitioner

## 2016-04-02 ENCOUNTER — Ambulatory Visit (HOSPITAL_BASED_OUTPATIENT_CLINIC_OR_DEPARTMENT_OTHER): Payer: 59 | Admitting: Oncology

## 2016-04-02 ENCOUNTER — Telehealth: Payer: Self-pay | Admitting: *Deleted

## 2016-04-02 ENCOUNTER — Encounter (HOSPITAL_COMMUNITY): Payer: Self-pay

## 2016-04-02 ENCOUNTER — Ambulatory Visit (HOSPITAL_BASED_OUTPATIENT_CLINIC_OR_DEPARTMENT_OTHER): Payer: 59

## 2016-04-02 ENCOUNTER — Other Ambulatory Visit (HOSPITAL_BASED_OUTPATIENT_CLINIC_OR_DEPARTMENT_OTHER): Payer: 59

## 2016-04-02 ENCOUNTER — Telehealth: Payer: Self-pay | Admitting: Oncology

## 2016-04-02 VITALS — BP 92/66 | HR 73 | Temp 97.7°F | Resp 16 | Ht 69.0 in | Wt 145.8 lb

## 2016-04-02 DIAGNOSIS — C250 Malignant neoplasm of head of pancreas: Secondary | ICD-10-CM | POA: Insufficient documentation

## 2016-04-02 DIAGNOSIS — G893 Neoplasm related pain (acute) (chronic): Secondary | ICD-10-CM

## 2016-04-02 DIAGNOSIS — C787 Secondary malignant neoplasm of liver and intrahepatic bile duct: Secondary | ICD-10-CM

## 2016-04-02 DIAGNOSIS — R634 Abnormal weight loss: Secondary | ICD-10-CM

## 2016-04-02 DIAGNOSIS — I700 Atherosclerosis of aorta: Secondary | ICD-10-CM | POA: Diagnosis not present

## 2016-04-02 DIAGNOSIS — C259 Malignant neoplasm of pancreas, unspecified: Secondary | ICD-10-CM

## 2016-04-02 DIAGNOSIS — R188 Other ascites: Secondary | ICD-10-CM | POA: Diagnosis not present

## 2016-04-02 DIAGNOSIS — N289 Disorder of kidney and ureter, unspecified: Secondary | ICD-10-CM | POA: Insufficient documentation

## 2016-04-02 DIAGNOSIS — Z5111 Encounter for antineoplastic chemotherapy: Secondary | ICD-10-CM | POA: Diagnosis not present

## 2016-04-02 DIAGNOSIS — I2510 Atherosclerotic heart disease of native coronary artery without angina pectoris: Secondary | ICD-10-CM | POA: Insufficient documentation

## 2016-04-02 DIAGNOSIS — R509 Fever, unspecified: Secondary | ICD-10-CM

## 2016-04-02 LAB — COMPREHENSIVE METABOLIC PANEL
ALT: <9 U/L (ref 0–55)
ANION GAP: 8 meq/L (ref 3–11)
AST: 16 U/L (ref 5–34)
Albumin: 2.9 g/dL — ABNORMAL LOW (ref 3.5–5.0)
Alkaline Phosphatase: 192 U/L — ABNORMAL HIGH (ref 40–150)
BUN: 6 mg/dL — ABNORMAL LOW (ref 7.0–26.0)
CALCIUM: 8.4 mg/dL (ref 8.4–10.4)
CHLORIDE: 102 meq/L (ref 98–109)
CO2: 30 meq/L — AB (ref 22–29)
Creatinine: 0.7 mg/dL (ref 0.7–1.3)
EGFR: >90 mL/min/{1.73_m2} (ref >90)
Glucose: 119 mg/dl (ref 70–140)
POTASSIUM: 3.8 meq/L (ref 3.5–5.1)
Sodium: 140 mEq/L (ref 136–145)
Total Bilirubin: 0.35 mg/dL (ref 0.20–1.20)
Total Protein: 6.1 g/dL — ABNORMAL LOW (ref 6.4–8.3)

## 2016-04-02 LAB — CBC WITH DIFFERENTIAL/PLATELET
BASO%: 0 % (ref 0.0–2.0)
BASOS ABS: 0 10*3/uL (ref 0.0–0.1)
EOS%: 3.9 % (ref 0.0–7.0)
Eosinophils Absolute: 0.2 10*3/uL (ref 0.0–0.5)
HEMATOCRIT: 34.9 % — AB (ref 38.4–49.9)
HGB: 11.2 g/dL — ABNORMAL LOW (ref 13.0–17.1)
LYMPH#: 0.4 10*3/uL — AB (ref 0.9–3.3)
LYMPH%: 10.9 % — AB (ref 14.0–49.0)
MCH: 29.4 pg (ref 27.2–33.4)
MCHC: 32.1 g/dL (ref 32.0–36.0)
MCV: 91.6 fL (ref 79.3–98.0)
MONO#: 0.5 10*3/uL (ref 0.1–0.9)
MONO%: 11.7 % (ref 0.0–14.0)
NEUT#: 2.8 10*3/uL (ref 1.5–6.5)
NEUT%: 73.5 % (ref 39.0–75.0)
PLATELETS: 200 10*3/uL (ref 140–400)
RBC: 3.81 10*6/uL — AB (ref 4.20–5.82)
RDW: 15.2 % — ABNORMAL HIGH (ref 11.0–14.6)
WBC: 3.8 10*3/uL — ABNORMAL LOW (ref 4.0–10.3)

## 2016-04-02 MED ORDER — HEPARIN SOD (PORK) LOCK FLUSH 100 UNIT/ML IV SOLN
500.0000 [IU] | Freq: Once | INTRAVENOUS | Status: DC | PRN
  Filled 2016-04-02: qty 5

## 2016-04-02 MED ORDER — SODIUM CHLORIDE 0.9 % IV SOLN
Freq: Once | INTRAVENOUS | Status: AC
  Administered 2016-04-02: 12:00:00 via INTRAVENOUS

## 2016-04-02 MED ORDER — PROCHLORPERAZINE MALEATE 10 MG PO TABS: 10.0000 mg | ORAL_TABLET | Freq: Once | ORAL | Status: DC

## 2016-04-02 MED ORDER — SODIUM CHLORIDE 0.9 % IV SOLN
1000.0000 mg/m2 | Freq: Once | INTRAVENOUS | Status: AC
  Administered 2016-04-02: 1786 mg via INTRAVENOUS
  Filled 2016-04-02: qty 46.97

## 2016-04-02 MED ORDER — PROCHLORPERAZINE MALEATE 10 MG PO TABS
ORAL_TABLET | ORAL | Status: AC
  Filled 2016-04-02: qty 1

## 2016-04-02 MED ORDER — IOPAMIDOL (ISOVUE-300) INJECTION 61%
100.0000 mL | Freq: Once | INTRAVENOUS | Status: AC | PRN
  Administered 2016-04-02: 100 mL via INTRAVENOUS

## 2016-04-02 MED ORDER — HYDROMORPHONE HCL 2 MG PO TABS: 2.0000 mg | ORAL_TABLET | ORAL | 0 refills | Status: DC | PRN

## 2016-04-02 MED ORDER — SODIUM CHLORIDE 0.9% FLUSH
10.0000 mL | INTRAVENOUS | Status: DC | PRN
  Filled 2016-04-02: qty 10

## 2016-04-02 MED ORDER — LORAZEPAM 0.5 MG PO TABS: ORAL_TABLET | ORAL | 0 refills | Status: DC

## 2016-04-02 NOTE — Progress Notes (Signed)
Pt did not have port de-access at infusion center, he has an Abd CT appt after chemo and they will need IV access for contrast, writer spoke with [place] in [place], and he can be de accessed in [place]. Pt and wife verbalized understanding.

## 2016-04-02 NOTE — Telephone Encounter (Signed)
GAVE RELATIVE AVS REPORT AND APPOINTMENTS FOR September  °

## 2016-04-02 NOTE — Patient Instructions (Signed)
[facility] Discharge Instructions for Patients Receiving Chemotherapy  Today you received the following chemotherapy agents Gemzar  To help prevent nausea and vomiting after your treatment, we encourage you to take your nausea medication as directed.    If you develop nausea and vomiting that is not controlled by your nausea medication, call the clinic.   BELOW ARE SYMPTOMS THAT SHOULD BE REPORTED IMMEDIATELY:  *FEVER GREATER THAN 100.5 F  *CHILLS WITH OR WITHOUT FEVER  NAUSEA AND VOMITING THAT IS NOT CONTROLLED WITH YOUR NAUSEA MEDICATION  *UNUSUAL SHORTNESS OF BREATH  *UNUSUAL BRUISING OR BLEEDING  TENDERNESS IN MOUTH AND THROAT WITH OR WITHOUT PRESENCE OF ULCERS  *URINARY PROBLEMS  *BOWEL PROBLEMS  UNUSUAL RASH Items with * indicate a potential emergency and should be followed up as soon as possible.  Feel free to call the clinic you have any questions or concerns. The clinic phone number is [phone].  Please show the CHEMO ALERT CARD at check-in to the Emergency Department and triage nurse.   

## 2016-04-02 NOTE — Progress Notes (Signed)
[place] OFFICE PROGRESS NOTE   Diagnosis:   Pancreas cancer  INTERVAL HISTORY: Mr. Jervis returns as scheduled. He completed another treatment with gemcitabine/Abraxane 03/19/2016. He reports a low-grade fever and malaise for a few days following chemotherapy. The numbness in the feet has progressed. His feet feel "asleep ". Stable numbness at the fingers. Good appetite and energy level. His pain has improved. He reports nausea on 03/30/2016 and 03/31/2016. He has abdominal pain after eating. No emesis. His bowels are moving.  Objective:  Vital signs in last 24 hours:  Blood pressure 92/66, pulse 73, temperature 97.7 F (36.5 C), temperature source Oral, resp. rate 16, height 5\' 9"  (1.753 m), weight 145 lb 12.8 oz (66.1 kg), SpO2 100 %.    HEENT: No thrush or ulcers Resp: Lungs clear bilaterally Cardio: Regular rate and rhythm GI: No hepatosplenomegaly, no mass, nontender, no apparent ascites Vascular: No leg edema Neuro: Moderate loss of vibratory sense at the fingertips bilaterally     Portacath/PICC-without erythema  Lab Results:  Lab Results  Component Value Date   WBC 3.8 (L) 04/02/2016   HGB 11.2 (L) 04/02/2016   HCT 34.9 (L) 04/02/2016   MCV 91.6 04/02/2016   PLT 200 04/02/2016   NEUTROABS 2.8 04/02/2016   CA 19-9 on 03/19/2016: 955   Imaging:  Ct Abdomen Pelvis W Contrast  Result Date: 04/02/2016 CLINICAL DATA:  65 year old male with history of pancreatic cancer with metastatic disease to the liver. Chemotherapy ongoing. Radiation therapy completed in April 2017. Chronic cough, shortness of breath and nausea. EXAM: CT ABDOMEN AND PELVIS WITH CONTRAST TECHNIQUE: Multidetector CT imaging of the abdomen and pelvis was performed using the standard protocol following bolus administration of intravenous contrast. CONTRAST:  128mL ISOVUE-300 IOPAMIDOL (ISOVUE-300) INJECTION 61% COMPARISON:  CT the abdomen and pelvis 12/24/2015. FINDINGS: Lower chest:  Areas of ground-glass attenuation, septal thickening and mild cylindrical bronchiectasis are noted in the visualize lung bases, very similar to the prior study, compatible with underlying interstitial lung disease. Atherosclerotic calcifications in the left anterior descending, left circumflex and right coronary arteries. Hepatobiliary: There are again multiple hypovascular hepatic metastases which appear very similar in size and number to the prior examination. Index lesion in the central aspect of segment 8 adjacent to the dome of the liver (image 9 of series 4) measuring 2.8 x 2.3 cm (previously 2.5 x 2.1 cm). The other index lesion in the inferior aspect of segment 6 of the liver currently measures 2.6 x 2.3 cm (image 24 of series 4), previously 2.7 x 2.2 cm. The other hepatic lesions also generally appear similar in size on today's examination, although there is a notable exception in the periphery of segment 8 where there is an enlarging 3.3 x 2.4 cm lesion (image 11 of series 4) which previously measured only 2.2 x 1.8 cm. No definite new hepatic lesions are noted. No intrahepatic biliary ductal dilatation. Pneumobilia again noted in the right lobe of the liver related to prior sphincterotomy and indwelling common bile duct stent. Gallbladder is normal in appearance. Pancreas: Previously noted hypovascular mass in the head of the pancreas appears slightly smaller than the prior study, currently measuring only 2.4 x 3.2 cm (image 28 of series 4), previously up to 3.4 x 3.1 cm on 12/24/2015. This mass again makes contact with the posterior and lateral aspect of the superior mesenteric vein and the undersurface of the splenoportal confluence. The previously noted contact with the superior mesenteric artery is no longer in noted.  This mass also partially encases the distal common bile duct where there is an indwelling stent. The body and tail of the pancreas are atrophic, and there is mild pancreatic ductal  prominence, without frank pancreatic ductal dilatation. Spleen: Unremarkable. Adrenals/Urinary Tract: Previously noted enhancing mass in the posterior aspect of the interpolar region of the right kidney appears stable to prior examinations measuring 1.2 x 1.6 cm. Left kidney is normal in appearance. Bilateral adrenal glands are normal in appearance. No hydroureteronephrosis. Urinary bladder is normal in appearance. Stomach/Bowel: Normal appearance of the stomach. No pathologic dilatation of small bowel or colon. Normal appendix. Vascular/Lymphatic: Aortic atherosclerosis, without evidence of aneurysm or dissection in the abdominal or pelvic vasculature. As mentioned above, the pancreatic lesion comes in direct contact with the superior mesenteric vein, and the undersurface of the splenoportal confluence, but no longer appears to contact the superior mesenteric artery. Splenic vein, superior mesenteric vein and portal vein are all patent at this time. No lymphadenopathy noted in the abdomen or pelvis. Reproductive: Prostate gland seminal vesicles are unremarkable in appearance. Other: Small volume of ascites.  No pneumoperitoneum. Musculoskeletal: There are no aggressive appearing lytic or blastic lesions noted in the visualized portions of the skeleton. IMPRESSION: 1. Overall, today's study demonstrates roughly stable disease. There is a slight mixed response to therapy. While the majority of the metastatic lesions in the liver appear stable compared to the prior study, one lesion appears slightly larger than prior examinations, and the primary pancreatic mass appears slightly smaller than the prior examination. No new sites of metastatic disease are noted in the abdomen or pelvis. 2. Interval development of a small volume of ascites. 3. Small enhancing lesion in the posterior aspect of the interpolar region of the right kidney is unchanged, and remain suspicious for a small renal cell carcinoma. 4. Aortic  atherosclerosis, in addition to at least 3 vessel coronary artery disease. 5. Additional incidental findings, as above. Electronically Signed   By: Vinnie Langton M.D.   On: 04/02/2016 15:21    Medications: I have reviewed the patient's current medications.  Assessment/Plan: 1. Pancreas cancer, clinical T3 N0, pancreas head mass, status post an EUS biopsy at [facility] on 09/17/2015 confirming a poorly differentiate a carcinoma  ultrasound staging 09/17/2015, T2 N0  CT chest at [facility] 09/29/2015 with patchy groundglass opacities throughout both lungs, nonspecific 5 mm right lung nodule  MRI liver 09/07/2015 revealed a pancreas head mass with bile duct obstruction, no focal liver lesions, no lymphadenopathy  CT abdomen/pelvis 09/21/2015-pancreas head mass with intra-and extrahepatic bile duct dilation despite placement of a stent, suspicion for SMV involvement  Xeloda/radiation started 10/15/2015  Xeloda placed on hold beginning 11/07/2015 due to progression of skin rash  Xeloda resumed at a reduced dose of 500 mg twice daily 11/10/2015  Xeloda discontinued 11/19/2015  Radiation completed 11/24/2015  Restaging CTs 12/24/2015 with enlargement of the pancreas head mass and development of liver metastases  Gemcitabine/Abraxane day 1/day 8/day 15 schedule of a 28 day cycle, Started 01/09/2016  Cycle 2 gemcitabine/Abraxane day one/day 8/day 15 schedule of a 28 day cycle (02/06/2016, 02/13/2016, 02/20/2016)  Gemcitabine/Abraxane schedule changed to every 2 weeks due to neuropathy and fatigue  CA-19-9 improved 03/05/2016  Restaging CT 04/02/2016 with decreased size of the pancreas mass, overall stable size of liver metastases with enlargement of one lesion  Abraxane held from the chemotherapy regimen on 04/02/2016  2. Idiopathic interstitial fibrosis of the lungs  3. Pain secondary to #1  4. Weight loss  5. 14 mm enhancing posterior right renal mass on CT 09/21/2015  suspicious for a renal cell carcinoma.  6. Rash on trunk and extremities secondary to Xeloda. 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 Progressive 11/07/2015. Xeloda placed on hold. Resumed at reduced dose 11/10/2015. Discontinued 11/19/2015.  7. Port-A-Cath placement 01/07/2016, interventional radiology  8. Fever 02/22/2016 status post evaluation in the emergency department. Chest x-ray and cultures negative. Fever likely related to gemcitabine.  9.    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     Disposition:  Mr. Balthaser has been treated with gemcitabine/Abraxane for the past 3 months. His performance status and pain have improved. The CA 19-9 is lower. The restaging CT reveals essentially stable disease. The plan is to continue chemotherapy. Abraxane will be held until the neuropathy symptoms improved.  He will complete treatment with gemcitabine today. Mr. Sollie will return for an office visit and chemotherapy in 2 weeks.  Betsy Coder, MD  04/02/2016  3:46 PM

## 2016-04-02 NOTE — Telephone Encounter (Signed)
MD unable to reach pt by phone to discuss CT results. Called pt with CT result: Report shows mixed response. Overall it looks OK, plan to push ahead with treatment- per Dr. Benay Spice. Pt voiced understanding. Requested tumor marker result- informed him it hasn't resulted yet. Will call with result next week.

## 2016-04-03 LAB — CANCER ANTIGEN 19-9: CA 19-9: 779 U/mL — ABNORMAL HIGH (ref 0–35)

## 2016-04-08 ENCOUNTER — Other Ambulatory Visit: Payer: Self-pay | Admitting: *Deleted

## 2016-04-08 DIAGNOSIS — C250 Malignant neoplasm of head of pancreas: Secondary | ICD-10-CM

## 2016-04-08 MED ORDER — PANCRELIPASE (LIP-PROT-AMYL) 3000-9500 UNITS PO CPEP: 3.0000 | ORAL_CAPSULE | Freq: Three times a day (TID) | ORAL | 0 refills | Status: DC

## 2016-04-08 NOTE — Telephone Encounter (Signed)
Call received from pt.'s wife requesting refill for Creon.  Dr. Benay Spice notified and order received.

## 2016-04-15 ENCOUNTER — Ambulatory Visit (HOSPITAL_BASED_OUTPATIENT_CLINIC_OR_DEPARTMENT_OTHER): Payer: 59 | Admitting: Nurse Practitioner

## 2016-04-15 ENCOUNTER — Telehealth: Payer: Self-pay | Admitting: Nurse Practitioner

## 2016-04-15 ENCOUNTER — Other Ambulatory Visit (HOSPITAL_BASED_OUTPATIENT_CLINIC_OR_DEPARTMENT_OTHER): Payer: 59

## 2016-04-15 ENCOUNTER — Other Ambulatory Visit: Payer: Self-pay

## 2016-04-15 ENCOUNTER — Ambulatory Visit: Payer: 59

## 2016-04-15 VITALS — BP 99/73 | HR 91 | Temp 97.4°F | Resp 17 | Ht 69.0 in | Wt 141.4 lb

## 2016-04-15 DIAGNOSIS — C250 Malignant neoplasm of head of pancreas: Secondary | ICD-10-CM | POA: Diagnosis not present

## 2016-04-15 DIAGNOSIS — G893 Neoplasm related pain (acute) (chronic): Secondary | ICD-10-CM

## 2016-04-15 DIAGNOSIS — R21 Rash and other nonspecific skin eruption: Secondary | ICD-10-CM | POA: Diagnosis not present

## 2016-04-15 DIAGNOSIS — C787 Secondary malignant neoplasm of liver and intrahepatic bile duct: Principal | ICD-10-CM

## 2016-04-15 DIAGNOSIS — C259 Malignant neoplasm of pancreas, unspecified: Secondary | ICD-10-CM

## 2016-04-15 LAB — COMPREHENSIVE METABOLIC PANEL
ALT: 18 U/L (ref 0–55)
AST: 19 U/L (ref 5–34)
Albumin: 2.8 g/dL — ABNORMAL LOW (ref 3.5–5.0)
Alkaline Phosphatase: 399 U/L — ABNORMAL HIGH (ref 40–150)
Anion Gap: 10 mEq/L (ref 3–11)
BILIRUBIN TOTAL: 0.55 mg/dL (ref 0.20–1.20)
BUN: 7.5 mg/dL (ref 7.0–26.0)
CHLORIDE: 101 meq/L (ref 98–109)
CO2: 29 meq/L (ref 22–29)
Calcium: 9.1 mg/dL (ref 8.4–10.4)
Creatinine: 0.7 mg/dL (ref 0.7–1.3)
GLUCOSE: 154 mg/dL — AB (ref 70–140)
Potassium: 3.7 mEq/L (ref 3.5–5.1)
SODIUM: 139 meq/L (ref 136–145)
TOTAL PROTEIN: 6.7 g/dL (ref 6.4–8.3)

## 2016-04-15 LAB — CBC WITH DIFFERENTIAL/PLATELET
BASO%: 0.3 % (ref 0.0–2.0)
Basophils Absolute: 0 10*3/uL (ref 0.0–0.1)
EOS ABS: 0.2 10*3/uL (ref 0.0–0.5)
EOS%: 3.8 % (ref 0.0–7.0)
HCT: 36.5 % — ABNORMAL LOW (ref 38.4–49.9)
HGB: 11.8 g/dL — ABNORMAL LOW (ref 13.0–17.1)
LYMPH%: 8.7 % — AB (ref 14.0–49.0)
MCH: 29 pg (ref 27.2–33.4)
MCHC: 32.3 g/dL (ref 32.0–36.0)
MCV: 89.7 fL (ref 79.3–98.0)
MONO#: 0.6 10*3/uL (ref 0.1–0.9)
MONO%: 15.3 % — AB (ref 0.0–14.0)
NEUT%: 71.9 % (ref 39.0–75.0)
NEUTROS ABS: 2.8 10*3/uL (ref 1.5–6.5)
Platelets: 198 10*3/uL (ref 140–400)
RBC: 4.07 10*6/uL — AB (ref 4.20–5.82)
RDW: 14.6 % (ref 11.0–14.6)
WBC: 3.9 10*3/uL — AB (ref 4.0–10.3)
lymph#: 0.3 10*3/uL — ABNORMAL LOW (ref 0.9–3.3)

## 2016-04-15 NOTE — Progress Notes (Addendum)
[place] OFFICE PROGRESS NOTE   Diagnosis:  Pancreas cancer  INTERVAL HISTORY:   [name] returns as scheduled. He completed another treatment with gemcitabine on 04/02/2016. Abraxane was held due to neuropathy symptoms. He has not felt well over the past 2 weeks. He is having intermittent nausea. He notes increased abdominal pain. He is taking 4 Dilaudid a day. Appetite has decreased. He is losing weight. He has had increased shortness of breath over the past week. He was able to walk into the office today with no shortness of breath. No chest pain. About a week ago he had a temperature of 100.4. No further fever. He reports a persistent cough which is unchanged over many months. He has persistent numbness in the hands and feet.  Objective:  Vital signs in last 24 hours:  Blood pressure 99/73, pulse 91, temperature 97.4 F (36.3 C), temperature source Oral, resp. rate 17, height 5\' 9"  (1.753 m), weight 141 lb 6.4 oz (64.1 kg), SpO2 98 %.    HEENT: No thrush or ulcers. Resp: Faint rales left lung base. No respiratory distress. Cardio: Distant heart sounds, irregular. GI: No hepatomegaly. No apparent ascites. Vascular: No leg edema.  Port-A-Cath without erythema.  Lab Results:  Lab Results  Component Value Date   WBC 3.9 (L) 04/15/2016   HGB 11.8 (L) 04/15/2016   HCT 36.5 (L) 04/15/2016   MCV 89.7 04/15/2016   PLT 198 04/15/2016   NEUTROABS 2.8 04/15/2016  EKG-sinus rhythm with PVCs  Imaging:  No results found.  Medications: I have reviewed the patient's current medications.  Assessment/Plan: 1. Pancreas cancer, clinical T3 N0, pancreas head mass, status post an EUS biopsy at [facility] on 09/17/2015 confirming a poorly differentiate a carcinoma  ultrasound staging 09/17/2015, T2 N0  CT chest at [facility] 09/29/2015 with patchy groundglass opacities throughout both lungs, nonspecific 5 mm right lung nodule  MRI liver 09/07/2015 revealed a pancreas head mass  with bile duct obstruction, no focal liver lesions, no lymphadenopathy  CT abdomen/pelvis 09/21/2015-pancreas head mass with intra-and extrahepatic bile duct dilation despite placement of a stent, suspicion for SMV involvement  Xeloda/radiation started 10/15/2015  Xeloda placed on hold beginning 11/07/2015 due to progression of skin rash  Xeloda resumed at a reduced dose of 500 mg twice daily 11/10/2015  Xeloda discontinued 11/19/2015  Radiation completed 11/24/2015  Restaging CTs 12/24/2015 with enlargement of the pancreas head mass and development of liver metastases  Gemcitabine/Abraxane day 1/day 8/day 15 schedule of a 28 day cycle, Started 01/09/2016  Cycle 2 gemcitabine/Abraxane day one/day 8/day 15 schedule of a 28 day cycle (02/06/2016, 02/13/2016, 02/20/2016)  Gemcitabine/Abraxane schedule changed to every 2 weeks due to neuropathy and fatigue  CA-19-9 improved 03/05/2016  Restaging CT 04/02/2016 with decreased size of the pancreas mass, overall stable size of liver metastases with enlargement of one lesion  Abraxane held from the chemotherapy regimen on 04/02/2016  2. Idiopathic interstitial fibrosis of the lungs  3. Pain secondary to #1  4. Weight loss  5. 14 mm enhancing posterior right renal mass on CT 09/21/2015 suspicious for a renal cell carcinoma.  6. Rash on trunk and extremities secondary to Xeloda. 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 Progressive 11/07/2015. Xeloda placed on hold. Resumed at reduced dose 11/10/2015. Discontinued 11/19/2015.  7. Port-A-Cath placement 01/07/2016, interventional radiology  8. Fever 02/22/2016 status post evaluation in the emergency department. Chest x-ray and cultures negative. Fever likely related to gemcitabine.  9. Neuropathy involving the  fingertips and feet. 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   Disposition: Roy English performance status has declined over the past 2 weeks. He is  having increased abdominal pain, nausea and anorexia/weight loss. Symptoms are concerning for progression of the cancer or less likely toxicity from treatment. We are holding today's treatment and will see him back in one week to reevaluate. We will follow-up on the CA-19-9 from today.  He will contact the office prior to his next visit with further problems.  Patient seen with Dr. Benay Spice. 25 minutes were spent face-to-face at today's visit with the majority of that time involved in counseling/coordination of care.    Ned Card ANP/GNP-BC   04/15/2016  11:37 AM This was a shared visit with Ned Card. Roy English was interviewed and examined. His performance status has declined. We are concerned his symptoms are related to progression of the pancreas cancer. We placed chemotherapy on hold. He will return for an office visit next week. We will consider Hospice care versus salvage chemotherapy if there is clear evidence of disease progression.  Julieanne Manson, M.D.

## 2016-04-15 NOTE — Telephone Encounter (Signed)
Avs report and schedule given to patient, per 04/15/16 los. °

## 2016-04-16 ENCOUNTER — Telehealth: Payer: Self-pay

## 2016-04-16 LAB — CANCER ANTIGEN 19-9: CA 19-9: 724 U/mL — ABNORMAL HIGH (ref 0–35)

## 2016-04-16 NOTE — Telephone Encounter (Signed)
-----   Message from Ladell Pier, MD sent at 04/16/2016  8:40 AM EDT ----- Please call patient, ca19-9 is slightly better

## 2016-04-16 NOTE — Telephone Encounter (Signed)
Called and informed pt of ca19-9 results. Pt verbalized understanding and appreciative of call. Pt denies any questions or concerns at this time.

## 2016-04-23 ENCOUNTER — Ambulatory Visit (HOSPITAL_BASED_OUTPATIENT_CLINIC_OR_DEPARTMENT_OTHER): Payer: 59 | Admitting: Nurse Practitioner

## 2016-04-23 ENCOUNTER — Telehealth: Payer: Self-pay | Admitting: *Deleted

## 2016-04-23 ENCOUNTER — Encounter: Payer: Self-pay | Admitting: Nurse Practitioner

## 2016-04-23 ENCOUNTER — Telehealth: Payer: Self-pay | Admitting: Nurse Practitioner

## 2016-04-23 ENCOUNTER — Ambulatory Visit (HOSPITAL_BASED_OUTPATIENT_CLINIC_OR_DEPARTMENT_OTHER): Payer: 59

## 2016-04-23 DIAGNOSIS — Z5111 Encounter for antineoplastic chemotherapy: Secondary | ICD-10-CM | POA: Diagnosis not present

## 2016-04-23 DIAGNOSIS — G893 Neoplasm related pain (acute) (chronic): Secondary | ICD-10-CM

## 2016-04-23 DIAGNOSIS — C787 Secondary malignant neoplasm of liver and intrahepatic bile duct: Secondary | ICD-10-CM

## 2016-04-23 DIAGNOSIS — C250 Malignant neoplasm of head of pancreas: Secondary | ICD-10-CM

## 2016-04-23 DIAGNOSIS — G620 Drug-induced polyneuropathy: Secondary | ICD-10-CM

## 2016-04-23 LAB — COMPREHENSIVE METABOLIC PANEL
ALBUMIN: 2.9 g/dL — AB (ref 3.5–5.0)
ALK PHOS: 284 U/L — AB (ref 40–150)
ALT: 11 U/L (ref 0–55)
AST: 19 U/L (ref 5–34)
Anion Gap: 8 mEq/L (ref 3–11)
BUN: 7.2 mg/dL (ref 7.0–26.0)
CHLORIDE: 102 meq/L (ref 98–109)
CO2: 30 meq/L — AB (ref 22–29)
Calcium: 8.8 mg/dL (ref 8.4–10.4)
Creatinine: 0.7 mg/dL (ref 0.7–1.3)
GLUCOSE: 135 mg/dL (ref 70–140)
POTASSIUM: 4 meq/L (ref 3.5–5.1)
SODIUM: 139 meq/L (ref 136–145)
Total Bilirubin: 0.34 mg/dL (ref 0.20–1.20)
Total Protein: 6.6 g/dL (ref 6.4–8.3)

## 2016-04-23 LAB — CBC WITH DIFFERENTIAL/PLATELET
BASO%: 0.2 % (ref 0.0–2.0)
BASOS ABS: 0 10*3/uL (ref 0.0–0.1)
EOS%: 4.7 % (ref 0.0–7.0)
Eosinophils Absolute: 0.2 10*3/uL (ref 0.0–0.5)
HCT: 35.3 % — ABNORMAL LOW (ref 38.4–49.9)
HEMOGLOBIN: 11.3 g/dL — AB (ref 13.0–17.1)
LYMPH%: 14.2 % (ref 14.0–49.0)
MCH: 29 pg (ref 27.2–33.4)
MCHC: 32 g/dL (ref 32.0–36.0)
MCV: 90.5 fL (ref 79.3–98.0)
MONO#: 0.5 10*3/uL (ref 0.1–0.9)
MONO%: 11.8 % (ref 0.0–14.0)
NEUT#: 2.9 10*3/uL (ref 1.5–6.5)
NEUT%: 69.1 % (ref 39.0–75.0)
Platelets: 226 10*3/uL (ref 140–400)
RBC: 3.9 10*6/uL — AB (ref 4.20–5.82)
RDW: 15 % — AB (ref 11.0–14.6)
WBC: 4.2 10*3/uL (ref 4.0–10.3)
lymph#: 0.6 10*3/uL — ABNORMAL LOW (ref 0.9–3.3)

## 2016-04-23 MED ORDER — LORAZEPAM 0.5 MG PO TABS: ORAL_TABLET | ORAL | 0 refills | Status: DC

## 2016-04-23 MED ORDER — HYDROMORPHONE HCL 2 MG PO TABS: 2.0000 mg | ORAL_TABLET | ORAL | 0 refills | Status: DC | PRN

## 2016-04-23 MED ORDER — SODIUM CHLORIDE 0.9 % IV SOLN
1000.0000 mg/m2 | Freq: Once | INTRAVENOUS | Status: AC
  Administered 2016-04-23: 1786 mg via INTRAVENOUS
  Filled 2016-04-23: qty 46.97

## 2016-04-23 MED ORDER — SODIUM CHLORIDE 0.9 % IV SOLN
Freq: Once | INTRAVENOUS | Status: AC
  Administered 2016-04-23: 13:00:00 via INTRAVENOUS

## 2016-04-23 MED ORDER — SODIUM CHLORIDE 0.9% FLUSH
10.0000 mL | INTRAVENOUS | Status: DC | PRN
  Administered 2016-04-23: 10 mL
  Filled 2016-04-23: qty 10

## 2016-04-23 MED ORDER — HEPARIN SOD (PORK) LOCK FLUSH 100 UNIT/ML IV SOLN
500.0000 [IU] | Freq: Once | INTRAVENOUS | Status: AC | PRN
  Administered 2016-04-23: 500 [IU]
  Filled 2016-04-23: qty 5

## 2016-04-23 MED ORDER — PROCHLORPERAZINE MALEATE 10 MG PO TABS
10.0000 mg | ORAL_TABLET | Freq: Once | ORAL | Status: AC
  Administered 2016-04-23: 10 mg via ORAL

## 2016-04-23 MED ORDER — MIRTAZAPINE 15 MG PO TABS: 15.0000 mg | ORAL_TABLET | Freq: Every day | ORAL | 2 refills | Status: AC

## 2016-04-23 NOTE — Progress Notes (Addendum)
[place] OFFICE PROGRESS NOTE   Diagnosis:  Pancreas cancer  INTERVAL HISTORY:   Roy English returns as scheduled. Chemotherapy was held last week due to a decline in his performance status. He is seen today for reevaluation.  He continues to have intermittent abdominal pain. Pain is worse on some days than others. He continues Dilaudid 4 times a day. He continues to have periodic nausea. He takes Zofran as needed. Bowels are moving. He has noted some improvement in his energy level. Persistent numbness in his feet. Mild numbness in the fingertips. Overall appetite is better.  Objective:  Vital signs in last 24 hours:  Blood pressure (!) 96/56, pulse 83, temperature 97.8 F (36.6 C), temperature source Oral, resp. rate 17, height 5\' 9"  (1.753 m), weight 141 lb 9.6 oz (64.2 kg), SpO2 97 %.    HEENT: No thrush or ulcers. Lymphatics: No palpable cervical or supra-clavicular lymph nodes. Resp: Lungs clear bilaterally. Cardio: Regular rate and rhythm. GI: Abdomen is soft. No hepatomegaly. No apparent ascites. Vascular: No leg edema. Port-A-Cath without erythema.   Lab Results:  Lab Results  Component Value Date   WBC 3.9 (L) 04/15/2016   HGB 11.8 (L) 04/15/2016   HCT 36.5 (L) 04/15/2016   MCV 89.7 04/15/2016   PLT 198 04/15/2016   NEUTROABS 2.8 04/15/2016  04/15/2016 CA-19-9 724  Imaging:  No results found.  Medications: I have reviewed the patient's current medications.  Assessment/Plan: 1. Pancreas cancer, clinical T3 N0, pancreas head mass, status post an EUS biopsy at [facility] on 09/17/2015 confirming a poorly differentiate a carcinoma  ultrasound staging 09/17/2015, T2 N0  CT chest at [facility] 09/29/2015 with patchy groundglass opacities throughout both lungs, nonspecific 5 mm right lung nodule  MRI liver 09/07/2015 revealed a pancreas head mass with bile duct obstruction, no focal liver lesions, no lymphadenopathy  CT abdomen/pelvis 09/21/2015-pancreas  head mass with intra-and extrahepatic bile duct dilation despite placement of a stent, suspicion for SMV involvement  Xeloda/radiation started 10/15/2015  Xeloda placed on hold beginning 11/07/2015 due to progression of skin rash  Xeloda resumed at a reduced dose of 500 mg twice daily 11/10/2015  Xeloda discontinued 11/19/2015  Radiation completed 11/24/2015  Restaging CTs 12/24/2015 with enlargement of the pancreas head mass and development of liver metastases  Gemcitabine/Abraxane day 1/day 8/day 15 schedule of a 28 day cycle, Started 01/09/2016  Cycle 2 gemcitabine/Abraxane day one/day 8/day 15 schedule of a 28 day cycle (02/06/2016, 02/13/2016, 02/20/2016)  Gemcitabine/Abraxane schedule changed to every 2 weeks due to neuropathy and fatigue  CA-19-9 improved 03/05/2016  Restaging CT 04/02/2016 with decreased size of the pancreas mass, overall stable size of liver metastases with enlargement of one lesion  Abraxane held from the chemotherapy regimen beginning 04/02/2016  2. Idiopathic interstitial fibrosis of the lungs  3. Pain secondary to #1  4. Weight loss  5. 14 mm enhancing posterior right renal mass on CT 09/21/2015 suspicious for a renal cell carcinoma.  6. Rash on trunk and extremities secondary to Xeloda. 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 Progressive 11/07/2015. Xeloda placed on hold. Resumed at reduced dose 11/10/2015. Discontinued 11/19/2015.  7. Port-A-Cath placement 01/07/2016, interventional radiology  8. Fever 02/22/2016 status post evaluation in the emergency department. Chest x-ray and cultures negative. Fever likely related to gemcitabine.  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   Disposition: Roy English appears stable. Chemotherapy was held last week due to a decline in his  performance status. The CA-19-9 was further improved last week. His performance status may be  slightly better. We decided to resume treatment today with gemcitabine, continue to hold Abraxane.  His family feels he is depressed. He is interested in an antidepressant. We decided to try Remeron 15 mg at bedtime.  He continues Dilaudid for abdominal pain. He was provided with a new prescription today.  He will return for a follow-up visit and gemcitabine in 2 weeks. He will contact the office in the interim with any problems.  Patient seen with Dr. Benay Spice.  Ned Card ANP/GNP-BC   04/23/2016  12:23 PM  This was a shared visit with Ned Card. Roy English appears stable. We discussed treatment options with Roy English and his family. We decided to continue single agent gemcitabine today. He will return for an office visit in 2 weeks. He will begin an antidepressant.  Julieanne Manson, M.D.

## 2016-04-23 NOTE — Telephone Encounter (Signed)
Per LOS I have scheduled appts and notified the scheduler 

## 2016-04-23 NOTE — Telephone Encounter (Signed)
Message sent to chemo scheduler to add infusion. Lab added for today. Avs report and appointment schedule given to patient, per 04/23/16 los.

## 2016-04-23 NOTE — Patient Instructions (Signed)
Cancer Center Discharge Instructions for Patients Receiving Chemotherapy  Today you received the following chemotherapy agents Gemzar  To help prevent nausea and vomiting after your treatment, we encourage you to take your nausea medication as directed.    If you develop nausea and vomiting that is not controlled by your nausea medication, call the clinic.   BELOW ARE SYMPTOMS THAT SHOULD BE REPORTED IMMEDIATELY:  *FEVER GREATER THAN 100.5 F  *CHILLS WITH OR WITHOUT FEVER  NAUSEA AND VOMITING THAT IS NOT CONTROLLED WITH YOUR NAUSEA MEDICATION  *UNUSUAL SHORTNESS OF BREATH  *UNUSUAL BRUISING OR BLEEDING  TENDERNESS IN MOUTH AND THROAT WITH OR WITHOUT PRESENCE OF ULCERS  *URINARY PROBLEMS  *BOWEL PROBLEMS  UNUSUAL RASH Items with * indicate a potential emergency and should be followed up as soon as possible.  Feel free to call the clinic you have any questions or concerns. The clinic phone number is [phone].  Please show the CHEMO ALERT CARD at check-in to the Emergency Department and triage nurse.   

## 2016-04-24 LAB — CANCER ANTIGEN 19-9: CA 19-9: 591 U/mL — ABNORMAL HIGH (ref 0–35)

## 2016-05-07 ENCOUNTER — Ambulatory Visit (HOSPITAL_BASED_OUTPATIENT_CLINIC_OR_DEPARTMENT_OTHER): Payer: 59

## 2016-05-07 ENCOUNTER — Telehealth: Payer: Self-pay

## 2016-05-07 ENCOUNTER — Ambulatory Visit (HOSPITAL_BASED_OUTPATIENT_CLINIC_OR_DEPARTMENT_OTHER): Payer: 59 | Admitting: Nurse Practitioner

## 2016-05-07 ENCOUNTER — Other Ambulatory Visit (HOSPITAL_BASED_OUTPATIENT_CLINIC_OR_DEPARTMENT_OTHER): Payer: 59

## 2016-05-07 DIAGNOSIS — R634 Abnormal weight loss: Secondary | ICD-10-CM | POA: Diagnosis not present

## 2016-05-07 DIAGNOSIS — C250 Malignant neoplasm of head of pancreas: Secondary | ICD-10-CM

## 2016-05-07 DIAGNOSIS — Z5111 Encounter for antineoplastic chemotherapy: Secondary | ICD-10-CM | POA: Diagnosis not present

## 2016-05-07 DIAGNOSIS — C787 Secondary malignant neoplasm of liver and intrahepatic bile duct: Secondary | ICD-10-CM | POA: Diagnosis not present

## 2016-05-07 DIAGNOSIS — G893 Neoplasm related pain (acute) (chronic): Secondary | ICD-10-CM

## 2016-05-07 DIAGNOSIS — Z23 Encounter for immunization: Secondary | ICD-10-CM | POA: Diagnosis not present

## 2016-05-07 LAB — CBC WITH DIFFERENTIAL/PLATELET
BASO%: 0 % (ref 0.0–2.0)
BASOS ABS: 0 10*3/uL (ref 0.0–0.1)
EOS%: 4.4 % (ref 0.0–7.0)
Eosinophils Absolute: 0.2 10*3/uL (ref 0.0–0.5)
HCT: 35.3 % — ABNORMAL LOW (ref 38.4–49.9)
HEMOGLOBIN: 11.3 g/dL — AB (ref 13.0–17.1)
LYMPH%: 14.7 % (ref 14.0–49.0)
MCH: 28.6 pg (ref 27.2–33.4)
MCHC: 32 g/dL (ref 32.0–36.0)
MCV: 89.4 fL (ref 79.3–98.0)
MONO#: 0.6 10*3/uL (ref 0.1–0.9)
MONO%: 15 % — ABNORMAL HIGH (ref 0.0–14.0)
NEUT#: 2.4 10*3/uL (ref 1.5–6.5)
NEUT%: 65.9 % (ref 39.0–75.0)
PLATELETS: 177 10*3/uL (ref 140–400)
RBC: 3.95 10*6/uL — ABNORMAL LOW (ref 4.20–5.82)
RDW: 14.9 % — AB (ref 11.0–14.6)
WBC: 3.7 10*3/uL — ABNORMAL LOW (ref 4.0–10.3)
lymph#: 0.5 10*3/uL — ABNORMAL LOW (ref 0.9–3.3)

## 2016-05-07 LAB — COMPREHENSIVE METABOLIC PANEL
ALBUMIN: 2.9 g/dL — AB (ref 3.5–5.0)
ALK PHOS: 219 U/L — AB (ref 40–150)
AST: 18 U/L (ref 5–34)
Anion Gap: 7 mEq/L (ref 3–11)
BILIRUBIN TOTAL: 0.3 mg/dL (ref 0.20–1.20)
BUN: 7.1 mg/dL (ref 7.0–26.0)
CO2: 30 mEq/L — ABNORMAL HIGH (ref 22–29)
CREATININE: 0.7 mg/dL (ref 0.7–1.3)
Calcium: 8.7 mg/dL (ref 8.4–10.4)
Chloride: 102 mEq/L (ref 98–109)
EGFR: >90 mL/min/{1.73_m2} (ref >90)
Glucose: 115 mg/dl (ref 70–140)
Potassium: 3.9 mEq/L (ref 3.5–5.1)
Sodium: 139 mEq/L (ref 136–145)
Total Protein: 6.5 g/dL (ref 6.4–8.3)

## 2016-05-07 MED ORDER — PROCHLORPERAZINE MALEATE 10 MG PO TABS: 10.0000 mg | ORAL_TABLET | Freq: Once | ORAL | Status: DC

## 2016-05-07 MED ORDER — HYDROMORPHONE HCL 2 MG PO TABS: 2.0000 mg | ORAL_TABLET | ORAL | 0 refills | Status: DC | PRN

## 2016-05-07 MED ORDER — SODIUM CHLORIDE 0.9% FLUSH
10.0000 mL | INTRAVENOUS | Status: DC | PRN
  Administered 2016-05-07: 10 mL
  Filled 2016-05-07: qty 10

## 2016-05-07 MED ORDER — HEPARIN SOD (PORK) LOCK FLUSH 100 UNIT/ML IV SOLN
500.0000 [IU] | Freq: Once | INTRAVENOUS | Status: AC | PRN
  Administered 2016-05-07: 500 [IU]
  Filled 2016-05-07: qty 5

## 2016-05-07 MED ORDER — SODIUM CHLORIDE 0.9 % IV SOLN
Freq: Once | INTRAVENOUS | Status: AC
  Administered 2016-05-07: 14:00:00 via INTRAVENOUS

## 2016-05-07 MED ORDER — SODIUM CHLORIDE 0.9 % IV SOLN
1000.0000 mg/m2 | Freq: Once | INTRAVENOUS | Status: AC
  Administered 2016-05-07: 1786 mg via INTRAVENOUS
  Filled 2016-05-07: qty 46.97

## 2016-05-07 MED ORDER — INFLUENZA VAC SPLIT QUAD 0.5 ML IM SUSY
0.5000 mL | PREFILLED_SYRINGE | Freq: Once | INTRAMUSCULAR | Status: AC
  Administered 2016-05-07: 0.5 mL via INTRAMUSCULAR
  Filled 2016-05-07: qty 0.5

## 2016-05-07 NOTE — Addendum Note (Signed)
Addended by: Betsy Coder B on: 05/07/2016 01:19 PM   Modules accepted: Orders

## 2016-05-07 NOTE — Progress Notes (Signed)
[place] OFFICE PROGRESS NOTE   Diagnosis:  Pancreas cancer  INTERVAL HISTORY:   Roy English returns as scheduled. He completed another cycle of gemcitabine 04/23/2016. Abraxane was held due to neuropathy symptoms.  Overall he is feeling better. Energy level was decreased for about a week after treatment. Better now. He had a single low-grade fever following treatment. He has been taking Tylenol about every 6 hours for the first few days. Since beginning Remeron he notes he is sleeping better and his appetite has improved. Minimal nausea with treatment. No vomiting. No diarrhea. Abdominal pain seems improved. He is taking less Dilaudid. Neuropathy symptoms are unchanged.  Objective:  Vital signs in last 24 hours:  Blood pressure 94/67, pulse 70, temperature 98.3 F (36.8 C), temperature source Oral, resp. rate 16, height 5\' 9"  (1.753 m), weight 142 lb 9.6 oz (64.7 kg), SpO2 97 %.    HEENT: No thrush or ulcers. Resp: Lungs clear bilaterally. Cardio: Regular rate and rhythm. GI: Abdomen is soft. No hepatomegaly. No apparent ascites. Vascular: No leg edema. Neuro: Vibratory sense mildly decreased over the fingertips per tuning fork exam.  Port-A-Cath without erythema.    Lab Results:  Lab Results  Component Value Date   WBC 3.7 (L) 05/07/2016   HGB 11.3 (L) 05/07/2016   HCT 35.3 (L) 05/07/2016   MCV 89.4 05/07/2016   PLT 177 05/07/2016   NEUTROABS 2.4 05/07/2016    Imaging:  No results found.  Medications: I have reviewed the patient's current medications.  Assessment/Plan: 1. Pancreas cancer, clinical T3 N0, pancreas head mass, status post an EUS biopsy at [facility] on 09/17/2015 confirming a poorly differentiate a carcinoma  ultrasound staging 09/17/2015, T2 N0  CT chest at [facility] 09/29/2015 with patchy groundglass opacities throughout both lungs, nonspecific 5 mm right lung nodule  MRI liver 09/07/2015 revealed a pancreas head mass with bile duct  obstruction, no focal liver lesions, no lymphadenopathy  CT abdomen/pelvis 09/21/2015-pancreas head mass with intra-and extrahepatic bile duct dilation despite placement of a stent, suspicion for SMV involvement  Xeloda/radiation started 10/15/2015  Xeloda placed on hold beginning 11/07/2015 due to progression of skin rash  Xeloda resumed at a reduced dose of 500 mg twice daily 11/10/2015  Xeloda discontinued 11/19/2015  Radiation completed 11/24/2015  Restaging CTs 12/24/2015 with enlargement of the pancreas head mass and development of liver metastases  Gemcitabine/Abraxane day 1/day 8/day 15 schedule of a 28 day cycle, Started 01/09/2016  Cycle 2 gemcitabine/Abraxane day one/day 8/day 15 schedule of a 28 day cycle (02/06/2016, 02/13/2016, 02/20/2016)  Gemcitabine/Abraxane schedule changed to every 2 weeks due to neuropathy and fatigue  CA-19-9 improved 03/05/2016  Restaging CT 04/02/2016 with decreased size of the pancreas mass, overall stable size of liver metastases with enlargement of one lesion  Abraxane held from the chemotherapy regimen beginning 04/02/2016  2. Idiopathic interstitial fibrosis of the lungs  3. Pain secondary to #1  4. Weight loss  5. 14 mm enhancing posterior right renal mass on CT 09/21/2015 suspicious for a renal cell carcinoma.  6. Rash on trunk and extremities secondary to Xeloda. 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 Progressive 11/07/2015. Xeloda placed on hold. Resumed at reduced dose 11/10/2015. Discontinued 11/19/2015.  7. Port-A-Cath placement 01/07/2016, interventional radiology  8. Fever 02/22/2016 status post evaluation in the emergency department. Chest x-ray and cultures negative. Fever likely related to gemcitabine.  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   Disposition: Roy English  appears stable. His performance status has improved. CA-19-9 tumor marker  was further improved 2 weeks ago. Plan to continue gemcitabine every 2 weeks. Abraxane remains on hold.  He will receive the influenza vaccine today and the pneumococcal 13 valent vaccine in 2 weeks.  He will return for a follow-up visit and gemcitabine on 05/21/2016. He will contact the office in the interim with any problems.  Plan reviewed with Dr. Benay Spice.   Ned Card ANP/GNP-BC   05/07/2016  12:12 PM

## 2016-05-07 NOTE — Patient Instructions (Signed)
[facility] Discharge Instructions for Patients Receiving Chemotherapy  Today you received the following chemotherapy agents:  Abraxane, Gemzar  To help prevent nausea and vomiting after your treatment, we encourage you to take your nausea medication as prescribed.   If you develop nausea and vomiting that is not controlled by your nausea medication, call the clinic.   BELOW ARE SYMPTOMS THAT SHOULD BE REPORTED IMMEDIATELY:  *FEVER GREATER THAN 100.5 F  *CHILLS WITH OR WITHOUT FEVER  NAUSEA AND VOMITING THAT IS NOT CONTROLLED WITH YOUR NAUSEA MEDICATION  *UNUSUAL SHORTNESS OF BREATH  *UNUSUAL BRUISING OR BLEEDING  TENDERNESS IN MOUTH AND THROAT WITH OR WITHOUT PRESENCE OF ULCERS  *URINARY PROBLEMS  *BOWEL PROBLEMS  UNUSUAL RASH Items with * indicate a potential emergency and should be followed up as soon as possible.  Feel free to call the clinic you have any questions or concerns. The clinic phone number is [phone].  Please show the CHEMO ALERT CARD at check-in to the Emergency Department and triage nurse.   

## 2016-05-07 NOTE — Telephone Encounter (Signed)
Appointments made,avs and calendar printed

## 2016-05-08 LAB — CANCER ANTIGEN 19-9: CA 19-9: 713 U/mL — ABNORMAL HIGH (ref 0–35)

## 2016-05-13 ENCOUNTER — Other Ambulatory Visit: Payer: Self-pay | Admitting: Nurse Practitioner

## 2016-05-13 DIAGNOSIS — C250 Malignant neoplasm of head of pancreas: Secondary | ICD-10-CM

## 2016-05-17 ENCOUNTER — Other Ambulatory Visit: Payer: Self-pay | Admitting: *Deleted

## 2016-05-17 DIAGNOSIS — C250 Malignant neoplasm of head of pancreas: Secondary | ICD-10-CM

## 2016-05-17 MED ORDER — LORAZEPAM 0.5 MG PO TABS: ORAL_TABLET | ORAL | 0 refills | Status: AC

## 2016-05-21 ENCOUNTER — Other Ambulatory Visit (HOSPITAL_BASED_OUTPATIENT_CLINIC_OR_DEPARTMENT_OTHER): Payer: 59

## 2016-05-21 ENCOUNTER — Telehealth: Payer: Self-pay | Admitting: Oncology

## 2016-05-21 ENCOUNTER — Ambulatory Visit (HOSPITAL_BASED_OUTPATIENT_CLINIC_OR_DEPARTMENT_OTHER): Payer: 59 | Admitting: Nurse Practitioner

## 2016-05-21 ENCOUNTER — Ambulatory Visit (HOSPITAL_BASED_OUTPATIENT_CLINIC_OR_DEPARTMENT_OTHER): Payer: 59

## 2016-05-21 VITALS — BP 91/62 | HR 45 | Temp 97.9°F | Resp 18 | Ht 69.0 in | Wt 145.4 lb

## 2016-05-21 DIAGNOSIS — Z5111 Encounter for antineoplastic chemotherapy: Secondary | ICD-10-CM

## 2016-05-21 DIAGNOSIS — C250 Malignant neoplasm of head of pancreas: Secondary | ICD-10-CM

## 2016-05-21 DIAGNOSIS — C787 Secondary malignant neoplasm of liver and intrahepatic bile duct: Secondary | ICD-10-CM

## 2016-05-21 DIAGNOSIS — R634 Abnormal weight loss: Secondary | ICD-10-CM

## 2016-05-21 DIAGNOSIS — G893 Neoplasm related pain (acute) (chronic): Secondary | ICD-10-CM | POA: Diagnosis not present

## 2016-05-21 DIAGNOSIS — C259 Malignant neoplasm of pancreas, unspecified: Secondary | ICD-10-CM

## 2016-05-21 DIAGNOSIS — Z23 Encounter for immunization: Secondary | ICD-10-CM

## 2016-05-21 DIAGNOSIS — R509 Fever, unspecified: Secondary | ICD-10-CM

## 2016-05-21 LAB — COMPREHENSIVE METABOLIC PANEL
ALT: 7 U/L (ref 0–55)
ANION GAP: 8 meq/L (ref 3–11)
AST: 18 U/L (ref 5–34)
Albumin: 3 g/dL — ABNORMAL LOW (ref 3.5–5.0)
Alkaline Phosphatase: 223 U/L — ABNORMAL HIGH (ref 40–150)
BILIRUBIN TOTAL: 0.22 mg/dL (ref 0.20–1.20)
BUN: 9.6 mg/dL (ref 7.0–26.0)
CHLORIDE: 104 meq/L (ref 98–109)
CO2: 29 meq/L (ref 22–29)
Calcium: 8.8 mg/dL (ref 8.4–10.4)
Creatinine: 0.8 mg/dL (ref 0.7–1.3)
Glucose: 133 mg/dl (ref 70–140)
Potassium: 4 mEq/L (ref 3.5–5.1)
Sodium: 141 mEq/L (ref 136–145)
TOTAL PROTEIN: 6.6 g/dL (ref 6.4–8.3)

## 2016-05-21 LAB — CBC WITH DIFFERENTIAL/PLATELET
BASO%: 0.3 % (ref 0.0–2.0)
BASOS ABS: 0 10*3/uL (ref 0.0–0.1)
EOS ABS: 0.1 10*3/uL (ref 0.0–0.5)
EOS%: 2.9 % (ref 0.0–7.0)
HCT: 36.9 % — ABNORMAL LOW (ref 38.4–49.9)
HGB: 11.8 g/dL — ABNORMAL LOW (ref 13.0–17.1)
LYMPH%: 11.3 % — AB (ref 14.0–49.0)
MCH: 28.5 pg (ref 27.2–33.4)
MCHC: 32 g/dL (ref 32.0–36.0)
MCV: 89.1 fL (ref 79.3–98.0)
MONO#: 0.4 10*3/uL (ref 0.1–0.9)
MONO%: 11.7 % (ref 0.0–14.0)
NEUT#: 2.4 10*3/uL (ref 1.5–6.5)
NEUT%: 73.8 % (ref 39.0–75.0)
PLATELETS: 178 10*3/uL (ref 140–400)
RBC: 4.14 10*6/uL — AB (ref 4.20–5.82)
RDW: 16.3 % — ABNORMAL HIGH (ref 11.0–14.6)
WBC: 3.2 10*3/uL — ABNORMAL LOW (ref 4.0–10.3)
lymph#: 0.4 10*3/uL — ABNORMAL LOW (ref 0.9–3.3)

## 2016-05-21 MED ORDER — HEPARIN SOD (PORK) LOCK FLUSH 100 UNIT/ML IV SOLN
500.0000 [IU] | Freq: Once | INTRAVENOUS | Status: AC | PRN
  Administered 2016-05-21: 500 [IU]
  Filled 2016-05-21: qty 5

## 2016-05-21 MED ORDER — PROCHLORPERAZINE MALEATE 10 MG PO TABS: 10.0000 mg | ORAL_TABLET | Freq: Once | ORAL | Status: DC

## 2016-05-21 MED ORDER — PNEUMOCOCCAL 13-VAL CONJ VACC IM SUSP
0.5000 mL | Freq: Once | INTRAMUSCULAR | Status: AC
  Administered 2016-05-21: 0.5 mL via INTRAMUSCULAR
  Filled 2016-05-21: qty 0.5

## 2016-05-21 MED ORDER — SODIUM CHLORIDE 0.9 % IV SOLN
Freq: Once | INTRAVENOUS | Status: AC
  Administered 2016-05-21: 12:00:00 via INTRAVENOUS

## 2016-05-21 MED ORDER — SODIUM CHLORIDE 0.9 % IV SOLN
1000.0000 mg/m2 | Freq: Once | INTRAVENOUS | Status: AC
  Administered 2016-05-21: 1786 mg via INTRAVENOUS
  Filled 2016-05-21: qty 46.97

## 2016-05-21 MED ORDER — SODIUM CHLORIDE 0.9% FLUSH
10.0000 mL | INTRAVENOUS | Status: DC | PRN
  Administered 2016-05-21: 10 mL
  Filled 2016-05-21: qty 10

## 2016-05-21 NOTE — Patient Instructions (Addendum)
[facility] Discharge Instructions for Patients Receiving Chemotherapy  Today you received the following chemotherapy agents: Gemzar.  To help prevent nausea and vomiting after your treatment, we encourage you to take your nausea medication as directed.  If you develop nausea and vomiting that is not controlled by your nausea medication, call the clinic.   BELOW ARE SYMPTOMS THAT SHOULD BE REPORTED IMMEDIATELY:  *FEVER GREATER THAN 100.5 F  *CHILLS WITH OR WITHOUT FEVER  NAUSEA AND VOMITING THAT IS NOT CONTROLLED WITH YOUR NAUSEA MEDICATION  *UNUSUAL SHORTNESS OF BREATH  *UNUSUAL BRUISING OR BLEEDING  TENDERNESS IN MOUTH AND THROAT WITH OR WITHOUT PRESENCE OF ULCERS  *URINARY PROBLEMS  *BOWEL PROBLEMS  UNUSUAL RASH Items with * indicate a potential emergency and should be followed up as soon as possible.  Feel free to call the clinic you have any questions or concerns. The clinic phone number is [phone].  Please show the [place] at check-in to the Emergency Department and triage nurse.  Pneumococcal Vaccine, Polyvalent suspension for injection What is this medicine? PNEUMOCOCCAL VACCINE (NEU mo KOK al vak SEEN) is a vaccine used to prevent pneumococcus bacterial infections. These bacteria can cause serious infections like pneumonia, meningitis, and blood infections. This vaccine will lower your chance of getting pneumonia. If you do get pneumonia, it can make your symptoms milder and your illness shorter. This vaccine will not treat an infection and will not cause infection. This vaccine is recommended for infants and young children, adults with certain medical conditions, and adults 42 years or older. This medicine may be used for other purposes; ask your health care provider or pharmacist if you have questions. What should I tell my health care provider before I take this medicine? They need to know if you have any of these conditions: -bleeding  problems -fever -immune system problems -an unusual or allergic reaction to pneumococcal vaccine, diphtheria toxoid, other vaccines, latex, other medicines, foods, dyes, or preservatives -pregnant or trying to get pregnant -breast-feeding How should I use this medicine? This vaccine is for injection into a muscle. It is given by a health care professional. A copy of Vaccine Information Statements will be given before each vaccination. Read this sheet carefully each time. The sheet may change frequently. Talk to your pediatrician regarding the use of this medicine in children. While this drug may be prescribed for children as young as 27 weeks old for selected conditions, precautions do apply. Overdosage: If you think you have taken too much of this medicine contact a poison control center or emergency room at once. NOTE: This medicine is only for you. Do not share this medicine with others. What if I miss a dose? It is important not to miss your dose. Call your doctor or health care professional if you are unable to keep an appointment. What may interact with this medicine? -medicines for cancer chemotherapy -medicines that suppress your immune function -steroid medicines like prednisone or cortisone This list may not describe all possible interactions. Give your health care provider a list of all the medicines, herbs, non-prescription drugs, or dietary supplements you use. Also tell them if you smoke, drink alcohol, or use illegal drugs. Some items may interact with your medicine. What should I watch for while using this medicine? Mild fever and pain should go away in 3 days or less. Report any unusual symptoms to your doctor or health care professional. What side effects may I notice from receiving this medicine? Side effects  that you should report to your doctor or health care professional as soon as possible: -allergic reactions like skin rash, itching or hives, swelling of the face, lips,  or tongue -breathing problems -confused -fast or irregular heartbeat -fever over 102 degrees F -seizures -unusual bleeding or bruising -unusual muscle weakness Side effects that usually do not require medical attention (report to your doctor or health care professional if they continue or are bothersome): -aches and pains -diarrhea -fever of 102 degrees F or less -headache -irritable -loss of appetite -pain, tender at site where injected -trouble sleeping This list may not describe all possible side effects. Call your doctor for medical advice about side effects. You may report side effects to FDA at 1-800-FDA-1088. Where should I keep my medicine? This does not apply. This vaccine is given in a clinic, pharmacy, doctor's office, or other health care setting and will not be stored at home. NOTE: This sheet is a summary. It may not cover all possible information. If you have questions about this medicine, talk to your doctor, pharmacist, or health care provider.    2016, Elsevier/Gold Standard. (2014-04-25 10:27:27)

## 2016-05-21 NOTE — Progress Notes (Signed)
[place] OFFICE PROGRESS NOTE   Diagnosis:  Pancreas cancer  INTERVAL HISTORY:   Roy English returns as scheduled. He completed another cycle of gemcitabine 05/07/2016. Abraxane has been on hold due to neuropathy symptoms. He overall is feeling well. No nausea or vomiting. No mouth sores. No diarrhea. No fever following the recent chemotherapy. He took Tylenol for 48 hours. Stable numbness in the feet. No numbness in the fingertips. Pain is well-controlled. Appetite is good. He is gaining weight.  Objective:  Vital signs in last 24 hours:  Blood pressure 91/62, pulse (!) 45, temperature 97.9 F (36.6 C), temperature source Oral, resp. rate 18, height 5\' 9"  (1.753 m), weight 145 lb 6.4 oz (66 kg), SpO2 99 %.    HEENT: No thrush or ulcers. Resp: Lungs clear bilaterally. Cardio: Regular rate and rhythm. GI: Abdomen soft and nontender. No hepatomegaly. No apparent ascites. Vascular: No leg edema. Port-A-Cath without erythema.   Lab Results:  Lab Results  Component Value Date   WBC 3.2 (L) 05/21/2016   HGB 11.8 (L) 05/21/2016   HCT 36.9 (L) 05/21/2016   MCV 89.1 05/21/2016   PLT 178 05/21/2016   NEUTROABS 2.4 05/21/2016    Imaging:  No results found.  Medications: I have reviewed the patient's current medications.  Assessment/Plan: 1. Pancreas cancer, clinical T3 N0, pancreas head mass, status post an EUS biopsy at [facility] on 09/17/2015 confirming a poorly differentiate a carcinoma  ultrasound staging 09/17/2015, T2 N0  CT chest at [facility] 09/29/2015 with patchy groundglass opacities throughout both lungs, nonspecific 5 mm right lung nodule  MRI liver 09/07/2015 revealed a pancreas head mass with bile duct obstruction, no focal liver lesions, no lymphadenopathy  CT abdomen/pelvis 09/21/2015-pancreas head mass with intra-and extrahepatic bile duct dilation despite placement of a stent, suspicion for SMV involvement  Xeloda/radiation started  10/15/2015  Xeloda placed on hold beginning 11/07/2015 due to progression of skin rash  Xeloda resumed at a reduced dose of 500 mg twice daily 11/10/2015  Xeloda discontinued 11/19/2015  Radiation completed 11/24/2015  Restaging CTs 12/24/2015 with enlargement of the pancreas head mass and development of liver metastases  Gemcitabine/Abraxane day 1/day 8/day 15 schedule of a 28 day cycle, Started 01/09/2016  Cycle 2 gemcitabine/Abraxane day one/day 8/day 15 schedule of a 28 day cycle (02/06/2016, 02/13/2016, 02/20/2016)  Gemcitabine/Abraxane schedule changed to every 2 weeks due to neuropathy and fatigue  CA-19-9 improved 03/05/2016  Restaging CT 04/02/2016 with decreased size of the pancreas mass, overall stable size of liver metastases with enlargement of one lesion  Abraxane held from the chemotherapy regimen beginning 04/02/2016  2. Idiopathic interstitial fibrosis of the lungs  3. Pain secondary to #1  4. Weight loss  5. 14 mm enhancing posterior right renal mass on CT 09/21/2015 suspicious for a renal cell carcinoma.  6. Rash on trunk and extremities secondary to Xeloda. 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 Progressive 11/07/2015. Xeloda placed on hold. Resumed at reduced dose 11/10/2015. Discontinued 11/19/2015.  7. Port-A-Cath placement 01/07/2016, interventional radiology  8. Fever 02/22/2016 status post evaluation in the emergency department. Chest x-ray and cultures negative. Fever likely related to gemcitabine. He is taking Tylenol prophylactically for a few days following each treatment.  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   Disposition: Roy English appears stable. Performance status continues to be improved. Plan to continue every 2 week gemcitabine. We will follow-up on the CA-19-9 from today.  He will receive  the pneumococcal 13 Valent vaccine today.  He will return for a  follow-up visit and gemcitabine in 2 weeks. He will contact the office in the interim with any problems.  Plan reviewed with Dr. Benay Spice.    Ned Card ANP/GNP-BC   05/21/2016  11:24 AM

## 2016-05-21 NOTE — Telephone Encounter (Signed)
11/6 and 11/17 appointments scheduled per 10/20 LOS. The patient was given calendars of future scheduled appointments and AVS.

## 2016-05-22 LAB — CANCER ANTIGEN 19-9: CAN 19-9: 754 U/mL — AB (ref 0–35)

## 2016-05-24 ENCOUNTER — Telehealth: Payer: Self-pay | Admitting: *Deleted

## 2016-05-24 NOTE — Telephone Encounter (Signed)
Per LOS I have scheduled appts and notified the scheduler 

## 2016-06-07 ENCOUNTER — Other Ambulatory Visit (HOSPITAL_BASED_OUTPATIENT_CLINIC_OR_DEPARTMENT_OTHER): Payer: 59

## 2016-06-07 ENCOUNTER — Telehealth: Payer: Self-pay | Admitting: Oncology

## 2016-06-07 ENCOUNTER — Telehealth: Payer: Self-pay | Admitting: *Deleted

## 2016-06-07 ENCOUNTER — Ambulatory Visit (HOSPITAL_BASED_OUTPATIENT_CLINIC_OR_DEPARTMENT_OTHER): Payer: 59

## 2016-06-07 ENCOUNTER — Ambulatory Visit (HOSPITAL_BASED_OUTPATIENT_CLINIC_OR_DEPARTMENT_OTHER): Payer: 59 | Admitting: Nurse Practitioner

## 2016-06-07 ENCOUNTER — Encounter: Payer: Self-pay | Admitting: *Deleted

## 2016-06-07 DIAGNOSIS — C259 Malignant neoplasm of pancreas, unspecified: Secondary | ICD-10-CM

## 2016-06-07 DIAGNOSIS — G893 Neoplasm related pain (acute) (chronic): Secondary | ICD-10-CM | POA: Diagnosis not present

## 2016-06-07 DIAGNOSIS — Z5111 Encounter for antineoplastic chemotherapy: Secondary | ICD-10-CM | POA: Diagnosis not present

## 2016-06-07 DIAGNOSIS — C250 Malignant neoplasm of head of pancreas: Secondary | ICD-10-CM

## 2016-06-07 DIAGNOSIS — C787 Secondary malignant neoplasm of liver and intrahepatic bile duct: Secondary | ICD-10-CM | POA: Diagnosis not present

## 2016-06-07 DIAGNOSIS — R634 Abnormal weight loss: Secondary | ICD-10-CM | POA: Diagnosis not present

## 2016-06-07 LAB — CBC WITH DIFFERENTIAL/PLATELET
BASO%: 0.3 % (ref 0.0–2.0)
BASOS ABS: 0 10*3/uL (ref 0.0–0.1)
EOS ABS: 0.1 10*3/uL (ref 0.0–0.5)
EOS%: 3.9 % (ref 0.0–7.0)
HCT: 35.8 % — ABNORMAL LOW (ref 38.4–49.9)
HGB: 11.5 g/dL — ABNORMAL LOW (ref 13.0–17.1)
LYMPH%: 17 % (ref 14.0–49.0)
MCH: 28.5 pg (ref 27.2–33.4)
MCHC: 32.1 g/dL (ref 32.0–36.0)
MCV: 88.8 fL (ref 79.3–98.0)
MONO#: 0.5 10*3/uL (ref 0.1–0.9)
MONO%: 12.5 % (ref 0.0–14.0)
NEUT#: 2.4 10*3/uL (ref 1.5–6.5)
NEUT%: 66.3 % (ref 39.0–75.0)
PLATELETS: 224 10*3/uL (ref 140–400)
RBC: 4.03 10*6/uL — AB (ref 4.20–5.82)
RDW: 15.6 % — ABNORMAL HIGH (ref 11.0–14.6)
WBC: 3.6 10*3/uL — ABNORMAL LOW (ref 4.0–10.3)
lymph#: 0.6 10*3/uL — ABNORMAL LOW (ref 0.9–3.3)
nRBC: 0 % (ref 0–0)

## 2016-06-07 LAB — COMPREHENSIVE METABOLIC PANEL
ALBUMIN: 2.8 g/dL — AB (ref 3.5–5.0)
ALK PHOS: 215 U/L — AB (ref 40–150)
ALT: 9 U/L (ref 0–55)
ANION GAP: 7 meq/L (ref 3–11)
AST: 21 U/L (ref 5–34)
BUN: 7.7 mg/dL (ref 7.0–26.0)
CO2: 29 meq/L (ref 22–29)
Calcium: 8.5 mg/dL (ref 8.4–10.4)
Chloride: 104 mEq/L (ref 98–109)
Creatinine: 0.7 mg/dL (ref 0.7–1.3)
GLUCOSE: 82 mg/dL (ref 70–140)
POTASSIUM: 3.9 meq/L (ref 3.5–5.1)
SODIUM: 140 meq/L (ref 136–145)
Total Protein: 6.5 g/dL (ref 6.4–8.3)

## 2016-06-07 MED ORDER — SODIUM CHLORIDE 0.9 % IV SOLN
1000.0000 mg/m2 | Freq: Once | INTRAVENOUS | Status: AC
  Administered 2016-06-07: 1786 mg via INTRAVENOUS
  Filled 2016-06-07: qty 46.97

## 2016-06-07 MED ORDER — HYDROMORPHONE HCL 2 MG PO TABS: 2.0000 mg | ORAL_TABLET | ORAL | 0 refills | Status: AC | PRN

## 2016-06-07 MED ORDER — SODIUM CHLORIDE 0.9 % IV SOLN
Freq: Once | INTRAVENOUS | Status: AC
  Administered 2016-06-07: 14:00:00 via INTRAVENOUS

## 2016-06-07 MED ORDER — PROCHLORPERAZINE MALEATE 10 MG PO TABS: 10.0000 mg | ORAL_TABLET | Freq: Once | ORAL | Status: DC

## 2016-06-07 MED ORDER — HEPARIN SOD (PORK) LOCK FLUSH 100 UNIT/ML IV SOLN
500.0000 [IU] | Freq: Once | INTRAVENOUS | Status: AC | PRN
  Administered 2016-06-07: 500 [IU]
  Filled 2016-06-07: qty 5

## 2016-06-07 MED ORDER — SODIUM CHLORIDE 0.9% FLUSH
10.0000 mL | INTRAVENOUS | Status: DC | PRN
  Administered 2016-06-07: 10 mL
  Filled 2016-06-07: qty 10

## 2016-06-07 NOTE — Patient Instructions (Signed)
[facility] Discharge Instructions for Patients Receiving Chemotherapy  Today you received the following chemotherapy agents Gemzar  To help prevent nausea and vomiting after your treatment, we encourage you to take your nausea medication as prescribed   If you develop nausea and vomiting that is not controlled by your nausea medication, call the clinic.   BELOW ARE SYMPTOMS THAT SHOULD BE REPORTED IMMEDIATELY:  *FEVER GREATER THAN 100.5 F  *CHILLS WITH OR WITHOUT FEVER  NAUSEA AND VOMITING THAT IS NOT CONTROLLED WITH YOUR NAUSEA MEDICATION  *UNUSUAL SHORTNESS OF BREATH  *UNUSUAL BRUISING OR BLEEDING  TENDERNESS IN MOUTH AND THROAT WITH OR WITHOUT PRESENCE OF ULCERS  *URINARY PROBLEMS  *BOWEL PROBLEMS  UNUSUAL RASH Items with * indicate a potential emergency and should be followed up as soon as possible.  Feel free to call the clinic you have any questions or concerns. The clinic phone number is [phone].  Please show the CHEMO ALERT CARD at check-in to the Emergency Department and triage nurse.   

## 2016-06-07 NOTE — Progress Notes (Signed)
Oncology Nurse Navigator Documentation  Oncology Nurse Navigator Flowsheets 06/07/2016  Navigator Location [facility]  Referral date to RadOnc/MedOnc -  Navigator Encounter Type Treatment  Telephone -  Abnormal Finding Date -  Confirmed Diagnosis Date -  Patient Visit Type MedOnc  Treatment Phase Active Tx--Gemzar every 2 weeks: cycle 6 day 1  Barriers/Navigation Needs No barriers at this time;No Questions;No Needs  Education -  Interventions None required  Coordination of Care -  Education Method -  Support Groups/Services Other--Declines information on support groups. Feels he is doing well. Wife agrees that he is eating well and feels well.  Acuity Level 1  Time Spent with Patient 15

## 2016-06-07 NOTE — Progress Notes (Addendum)
[place] OFFICE PROGRESS NOTE   Diagnosis:  Pancreas cancer  INTERVAL HISTORY:   Mr. Ryker returns as scheduled. He completed another cycle of gemcitabine 05/21/2016. He denies nausea/vomiting. No mouth sores. No diarrhea. He continues to be fatigued. He has persistent numbness in the feet, less so in the hands. Abdominal pain is well-controlled with Dilaudid 3 times a day. He reports progressive "arthritis" involving multiple fingers. There is no significant pain. He mainly notes "stiffness". He is taking Aleve twice a day.  Objective:  Vital signs in last 24 hours:  Blood pressure 94/60, pulse (!) 54, temperature 97.6 F (36.4 C), temperature source Oral, resp. rate 16, height 5\' 9"  (1.753 m), weight 147 lb 14.4 oz (67.1 kg), SpO2 100 %.    HEENT: No thrush or ulcers. Resp: Lungs clear bilaterally. Cardio: Regular rate and rhythm. GI: Abdomen soft and nontender. No hepatomegaly. Vascular: No leg edema. Musculoskeletal: No significant finger joint erythema/edema. Dupuytren's contracture bilateral palm. Port-A-Cath without erythema.  Lab Results:  Lab Results  Component Value Date   WBC 3.6 (L) 06/07/2016   HGB 11.5 (L) 06/07/2016   HCT 35.8 (L) 06/07/2016   MCV 88.8 06/07/2016   PLT 224 06/07/2016   NEUTROABS 2.4 06/07/2016    Imaging:  No results found.  Medications: I have reviewed the patient's current medications.  Assessment/Plan: 1. Pancreas cancer, clinical T3 N0, pancreas head mass, status post an EUS biopsy at [facility] on 09/17/2015 confirming a poorly differentiate a carcinoma  ultrasound staging 09/17/2015, T2 N0  CT chest at [facility] 09/29/2015 with patchy groundglass opacities throughout both lungs, nonspecific 5 mm right lung nodule  MRI liver 09/07/2015 revealed a pancreas head mass with bile duct obstruction, no focal liver lesions, no lymphadenopathy  CT abdomen/pelvis 09/21/2015-pancreas head mass with intra-and extrahepatic bile duct  dilation despite placement of a stent, suspicion for SMV involvement  Xeloda/radiation started 10/15/2015  Xeloda placed on hold beginning 11/07/2015 due to progression of skin rash  Xeloda resumed at a reduced dose of 500 mg twice daily 11/10/2015  Xeloda discontinued 11/19/2015  Radiation completed 11/24/2015  Restaging CTs 12/24/2015 with enlargement of the pancreas head mass and development of liver metastases  Gemcitabine/Abraxane day 1/day 8/day 15 schedule of a 28 day cycle, Started 01/09/2016  Cycle 2 gemcitabine/Abraxane day one/day 8/day 15 schedule of a 28 day cycle (02/06/2016, 02/13/2016, 02/20/2016)  Gemcitabine/Abraxane schedule changed to every 2 weeks due to neuropathy and fatigue  CA-19-9 improved 03/05/2016  Restaging CT 04/02/2016 with decreased size of the pancreas mass, overall stable size of liver metastases with enlargement of one lesion  Abraxane held from the chemotherapy regimen beginning 04/02/2016  2. Idiopathic interstitial fibrosis of the lungs  3. Pain secondary to #1  4. Weight loss  5. 14 mm enhancing posterior right renal mass on CT 09/21/2015 suspicious for a renal cell carcinoma.  6. Rash on trunk and extremities secondary to Xeloda. 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 Progressive 11/07/2015. Xeloda placed on hold. Resumed at reduced dose 11/10/2015. Discontinued 11/19/2015.  7. Port-A-Cath placement 01/07/2016, interventional radiology  8. Fever 02/22/2016 status post evaluation in the emergency department. Chest x-ray and cultures negative. Fever likely related to gemcitabine. He is taking Tylenol prophylactically for a few days following each treatment.  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   Disposition: Mr. Markum appears stable. Plan to continue gemcitabine every 2 weeks. We will follow-up on the CA-19-9 from today. The  plan is for restaging CT scans  in approximately one month.  The finger joint "stiffness" is not likely related to gemcitabine. He will continue an over-the-counter nonsteroidal anti-inflammatory as needed.  He will return for a follow-up visit and gemcitabine in 2 weeks. He will contact the office in the interim with any problems.  Patient seen with Dr. Benay Spice.  Ned Card ANP/GNP-BC   06/07/2016  12:27 PM  This was a shared visit with Ned Card. Mr. Demarest was interviewed and examined. He appears to be tolerating the chemotherapy well. I doubt the finger stiffness is related to chemotherapy. He will return for an office visit and chemotherapy in 2 weeks.  Julieanne Manson, M.D.

## 2016-06-07 NOTE — Telephone Encounter (Signed)
Per LOS I have scheduled appt and notified the scheduler 

## 2016-06-07 NOTE — Telephone Encounter (Signed)
Patient refused [place] appointment. Stated that he does not need it. Message sent to chemo scheduler to add chemo per 06/07/16 los. Appointments scheduled per 06/07/16 los. AVS report and appointment schedule given to patient, per 06/07/16 los.

## 2016-06-08 LAB — CANCER ANTIGEN 19-9: CA 19-9: 701 U/mL — ABNORMAL HIGH (ref 0–35)

## 2016-06-10 ENCOUNTER — Other Ambulatory Visit: Payer: Self-pay | Admitting: Nurse Practitioner

## 2016-06-10 DIAGNOSIS — C250 Malignant neoplasm of head of pancreas: Secondary | ICD-10-CM

## 2016-06-12 ENCOUNTER — Encounter (HOSPITAL_COMMUNITY): Payer: Self-pay | Admitting: Emergency Medicine

## 2016-06-12 ENCOUNTER — Inpatient Hospital Stay (HOSPITAL_COMMUNITY)
Admission: EM | Admit: 2016-06-12 | Discharge: 2016-06-21 | DRG: 872 | Disposition: A | Discharge status: Hospice | Payer: 59 | Attending: Family Medicine | Admitting: Family Medicine

## 2016-06-12 ENCOUNTER — Emergency Department (HOSPITAL_COMMUNITY): Payer: 59

## 2016-06-12 DIAGNOSIS — Z7189 Other specified counseling: Secondary | ICD-10-CM

## 2016-06-12 DIAGNOSIS — A419 Sepsis, unspecified organism: Secondary | ICD-10-CM | POA: Diagnosis not present

## 2016-06-12 DIAGNOSIS — G629 Polyneuropathy, unspecified: Secondary | ICD-10-CM | POA: Diagnosis present

## 2016-06-12 DIAGNOSIS — R64 Cachexia: Secondary | ICD-10-CM | POA: Diagnosis present

## 2016-06-12 DIAGNOSIS — R5081 Fever presenting with conditions classified elsewhere: Secondary | ICD-10-CM | POA: Diagnosis present

## 2016-06-12 DIAGNOSIS — K56609 Unspecified intestinal obstruction, unspecified as to partial versus complete obstruction: Secondary | ICD-10-CM

## 2016-06-12 DIAGNOSIS — K56600 Partial intestinal obstruction, unspecified as to cause: Secondary | ICD-10-CM | POA: Diagnosis present

## 2016-06-12 DIAGNOSIS — G4700 Insomnia, unspecified: Secondary | ICD-10-CM | POA: Diagnosis not present

## 2016-06-12 DIAGNOSIS — Z6822 Body mass index (BMI) 22.0-22.9, adult: Secondary | ICD-10-CM

## 2016-06-12 DIAGNOSIS — E861 Hypovolemia: Secondary | ICD-10-CM | POA: Diagnosis present

## 2016-06-12 DIAGNOSIS — J84112 Idiopathic pulmonary fibrosis: Secondary | ICD-10-CM | POA: Diagnosis present

## 2016-06-12 DIAGNOSIS — C259 Malignant neoplasm of pancreas, unspecified: Secondary | ICD-10-CM | POA: Diagnosis present

## 2016-06-12 DIAGNOSIS — I4891 Unspecified atrial fibrillation: Secondary | ICD-10-CM | POA: Diagnosis not present

## 2016-06-12 DIAGNOSIS — E872 Acidosis: Secondary | ICD-10-CM | POA: Diagnosis present

## 2016-06-12 DIAGNOSIS — Z515 Encounter for palliative care: Secondary | ICD-10-CM | POA: Diagnosis present

## 2016-06-12 DIAGNOSIS — T451X5A Adverse effect of antineoplastic and immunosuppressive drugs, initial encounter: Secondary | ICD-10-CM | POA: Diagnosis present

## 2016-06-12 DIAGNOSIS — Z66 Do not resuscitate: Secondary | ICD-10-CM | POA: Diagnosis present

## 2016-06-12 DIAGNOSIS — Z9221 Personal history of antineoplastic chemotherapy: Secondary | ICD-10-CM

## 2016-06-12 DIAGNOSIS — R188 Other ascites: Secondary | ICD-10-CM

## 2016-06-12 DIAGNOSIS — R338 Other retention of urine: Secondary | ICD-10-CM | POA: Diagnosis not present

## 2016-06-12 DIAGNOSIS — E876 Hypokalemia: Secondary | ICD-10-CM | POA: Diagnosis present

## 2016-06-12 DIAGNOSIS — N179 Acute kidney failure, unspecified: Secondary | ICD-10-CM | POA: Diagnosis present

## 2016-06-12 DIAGNOSIS — Z8249 Family history of ischemic heart disease and other diseases of the circulatory system: Secondary | ICD-10-CM

## 2016-06-12 DIAGNOSIS — L598 Other specified disorders of the skin and subcutaneous tissue related to radiation: Secondary | ICD-10-CM | POA: Diagnosis present

## 2016-06-12 DIAGNOSIS — R21 Rash and other nonspecific skin eruption: Secondary | ICD-10-CM | POA: Diagnosis present

## 2016-06-12 DIAGNOSIS — Z8261 Family history of arthritis: Secondary | ICD-10-CM

## 2016-06-12 DIAGNOSIS — Z8507 Personal history of malignant neoplasm of pancreas: Secondary | ICD-10-CM

## 2016-06-12 DIAGNOSIS — C787 Secondary malignant neoplasm of liver and intrahepatic bile duct: Secondary | ICD-10-CM | POA: Diagnosis present

## 2016-06-12 DIAGNOSIS — N2889 Other specified disorders of kidney and ureter: Secondary | ICD-10-CM | POA: Diagnosis present

## 2016-06-12 DIAGNOSIS — Z9981 Dependence on supplemental oxygen: Secondary | ICD-10-CM

## 2016-06-12 DIAGNOSIS — J849 Interstitial pulmonary disease, unspecified: Secondary | ICD-10-CM | POA: Diagnosis present

## 2016-06-12 DIAGNOSIS — I493 Ventricular premature depolarization: Secondary | ICD-10-CM | POA: Diagnosis not present

## 2016-06-12 DIAGNOSIS — D709 Neutropenia, unspecified: Secondary | ICD-10-CM | POA: Diagnosis present

## 2016-06-12 DIAGNOSIS — R509 Fever, unspecified: Secondary | ICD-10-CM | POA: Diagnosis not present

## 2016-06-12 DIAGNOSIS — Z923 Personal history of irradiation: Secondary | ICD-10-CM

## 2016-06-12 DIAGNOSIS — R109 Unspecified abdominal pain: Secondary | ICD-10-CM

## 2016-06-12 LAB — CBC WITH DIFFERENTIAL/PLATELET
BASOS PCT: 0 %
Basophils Absolute: 0 10*3/uL (ref 0.0–0.1)
EOS ABS: 0 10*3/uL (ref 0.0–0.7)
Eosinophils Relative: 0 %
HCT: 37.7 % — ABNORMAL LOW (ref 39.0–52.0)
HEMOGLOBIN: 12.4 g/dL — AB (ref 13.0–17.0)
Lymphocytes Relative: 11 %
Lymphs Abs: 0.2 10*3/uL — ABNORMAL LOW (ref 0.7–4.0)
MCH: 28.8 pg (ref 26.0–34.0)
MCHC: 32.9 g/dL (ref 30.0–36.0)
MCV: 87.7 fL (ref 78.0–100.0)
Monocytes Absolute: 0.2 10*3/uL (ref 0.1–1.0)
Monocytes Relative: 10 %
NEUTROS ABS: 1.5 10*3/uL — AB (ref 1.7–7.7)
NEUTROS PCT: 79 %
Platelets: 167 10*3/uL (ref 150–400)
RBC: 4.3 MIL/uL (ref 4.22–5.81)
RDW: 15.7 % — ABNORMAL HIGH (ref 11.5–15.5)
WBC: 1.9 10*3/uL — AB (ref 4.0–10.5)

## 2016-06-12 LAB — COMPREHENSIVE METABOLIC PANEL
ALK PHOS: 189 U/L — AB (ref 38–126)
ALT: 18 U/L (ref 17–63)
ANION GAP: 8 (ref 5–15)
AST: 40 U/L (ref 15–41)
Albumin: 3.3 g/dL — ABNORMAL LOW (ref 3.5–5.0)
BILIRUBIN TOTAL: 0.7 mg/dL (ref 0.3–1.2)
BUN: 13 mg/dL (ref 6–20)
CALCIUM: 8.4 mg/dL — AB (ref 8.9–10.3)
CO2: 26 mmol/L (ref 22–32)
Chloride: 103 mmol/L (ref 101–111)
Creatinine, Ser: 0.94 mg/dL (ref 0.61–1.24)
GFR calc non Af Amer: >60 mL/min (ref >60)
Glucose, Bld: 129 mg/dL — ABNORMAL HIGH (ref 65–99)
POTASSIUM: 3.4 mmol/L — AB (ref 3.5–5.1)
SODIUM: 137 mmol/L (ref 135–145)
TOTAL PROTEIN: 6.6 g/dL (ref 6.5–8.1)

## 2016-06-12 LAB — LIPASE, BLOOD: Lipase: 12 U/L (ref 11–51)

## 2016-06-12 MED ORDER — ONDANSETRON HCL 4 MG/2ML IJ SOLN
4.0000 mg | Freq: Once | INTRAMUSCULAR | Status: AC
  Administered 2016-06-12: 4 mg via INTRAVENOUS
  Filled 2016-06-12: qty 2

## 2016-06-12 MED ORDER — SODIUM CHLORIDE 0.9 % IV BOLUS (SEPSIS)
1000.0000 mL | Freq: Once | INTRAVENOUS | Status: AC
  Administered 2016-06-12: 1000 mL via INTRAVENOUS

## 2016-06-12 MED ORDER — IOPAMIDOL (ISOVUE-300) INJECTION 61%
100.0000 mL | Freq: Once | INTRAVENOUS | Status: AC | PRN
  Administered 2016-06-12: 100 mL via INTRAVENOUS

## 2016-06-12 MED ORDER — HYDROMORPHONE HCL 1 MG/ML IJ SOLN
1.0000 mg | Freq: Once | INTRAMUSCULAR | Status: AC
  Administered 2016-06-12: 1 mg via INTRAVENOUS
  Filled 2016-06-12: qty 1

## 2016-06-12 NOTE — ED Provider Notes (Signed)
[facility] DEPT Provider Note   CSN: [id number] Arrival date & time: 06/12/16  2133     History   Chief Complaint Chief Complaint  Patient presents with  . Fever  . Emesis    HPI Roy English is a 65 y.o. male.  Patient is a 65 year old male with past medical history of pancreatic cancer diagnosed in February 2017. He is currently undergoing chemotherapy under the direction of Dr. Benay Spice. He presents today with complaints of abdominal pain since earlier today. He reports running a fever up to 103 at home and also having loose stool and vomiting. He denies any urinary complaints. He denies any chest pain or productive cough.   The history is provided by the patient.  Fever   This is a new problem. Episode onset: This morning. The problem occurs constantly. The problem has been gradually worsening. The maximum temperature noted was 102 to 102.9 F. Associated symptoms include diarrhea and vomiting. Pertinent negatives include no chest pain. He has tried nothing for the symptoms. The treatment provided no relief.  Emesis   Associated symptoms include diarrhea and a fever.    Past Medical History:  Diagnosis Date  . Actinic keratosis   . Arthritis   . 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 Dilated cbd, acquired   . ED (erectile dysfunction)   . Elevated LFTs   . Environmental allergies   . Idiopathic interstitial fibrosis of lung syndrome ([place])   . S/P radiation therapy 11/24/15 completed    pancreas 50.4Gy    Patient Active Problem List   Diagnosis Date Noted  . 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 Malignant neoplasm of head of pancreas ([place]) 10/03/2015  . Biliary stent migration   . Acute abdominal pain 09/21/2015  . Hypokalemia 09/21/2015  . Common bile duct stricture   . Pancreatic mass   . Elevated liver enzymes 09/01/2015  . Interstitial lung disease ([place]) 06/22/2012    Past Surgical History:  Procedure  Laterality Date  . [facility] Separation Surgery Bilateral   . BILIARY STENT PLACEMENT N/A 09/23/2015   Procedure: BILIARY STENT PLACEMENT;  Surgeon: Milus Banister, MD;  Location: [facility] ENDOSCOPY;  Service: Endoscopy;  Laterality: N/A;  . carpal tunnel surgery Bilateral   . ERCP N/A 09/08/2015   Procedure: ENDOSCOPIC RETROGRADE CHOLANGIOPANCREATOGRAPHY (ERCP);  Surgeon: Gatha Mayer, MD;  Location: [facility];  Service: Endoscopy;  Laterality: N/A;  . ERCP N/A 09/23/2015   Procedure: ENDOSCOPIC RETROGRADE CHOLANGIOPANCREATOGRAPHY (ERCP);  Surgeon: Milus Banister, MD;  Location: [facility];  Service: Endoscopy;  Laterality: N/A;  with stent exchange  . KNEE ARTHROSCOPY Right   . multiple surgeries     ribs,arms,nose,fingers  . skin cancer removal      face and chest       Home Medications    Prior to Admission medications   Medication Sig Start Date End Date Taking? Authorizing Provider  acetaminophen (TYLENOL) 500 MG tablet Take 1,000 mg by mouth every 6 (six) hours as needed for mild pain, moderate pain or headache.    Historical Provider, MD  Bisacodyl (DULCOLAX PO) Take by mouth.    Historical Provider, MD  HYDROmorphone (DILAUDID) 2 MG tablet Take 1-2 tablets (2-4 mg total) by mouth every 4 (four) hours as needed for severe pain. 06/07/16   Owens Shark, NP  lactose free nutrition (BOOST) LIQD Take 237 mLs by mouth 2 (two) times daily between meals.    Historical  Provider, MD  lidocaine-prilocaine (EMLA) cream Apply 1 application topically as needed (prior to accessing port).    Historical Provider, MD  LORazepam (ATIVAN) 0.5 MG tablet TAKE 1 TABLET BY MOUTH EVERY 4 HOURS AS NEEDED FOR ANXIETY/NAUSEA 05/17/16   Owens Shark, NP  mirtazapine (REMERON) 15 MG tablet Take 1 tablet (15 mg total) by mouth at bedtime. 04/23/16   Owens Shark, NP  omeprazole (PRILOSEC) 20 MG capsule Take 20 mg by mouth at bedtime.    Historical Provider, MD  ondansetron (ZOFRAN) 8 MG tablet Take 1 tablet (8 mg  total) by mouth every 6 (six) hours as needed for nausea or vomiting. 01/12/16   Ladell Pier, MD  Pancrelipase, Lip-Prot-Amyl, (CREON) 3000-9500 units CPEP Take 3 capsules by mouth 3 (three) times daily with meals. 04/08/16   Historical Provider, MD  polyethylene glycol (MIRALAX / GLYCOLAX) packet Take 17 g by mouth daily.     Historical Provider, MD  prochlorperazine (COMPAZINE) 10 MG tablet Take 10 mg by mouth every 6 (six) hours as needed for nausea or vomiting.    Historical Provider, MD    Family History Family History  Problem Relation Age of Onset  . Heart disease Mother   . Rheum arthritis Mother   . Cancer      both sides of the family, type unknown    Social History Social History  Substance Use Topics  . Smoking status: Never Smoker  . Smokeless tobacco: Never Used  . Alcohol use 0.0 oz/week     Comment: 6 beer daily-heavy on the weekends     Allergies   Penicillins   Review of Systems Review of Systems  Constitutional: Positive for fever.  Cardiovascular: Negative for chest pain.  Gastrointestinal: Positive for diarrhea and vomiting.  All other systems reviewed and are negative.    Physical Exam Updated Vital Signs BP 90/63 (BP Location: Left Arm)   Pulse 95   Temp 97.9 F (36.6 C) (Oral)   Resp 20   Ht 5\' 9"  (1.753 m)   Wt 140 lb (63.5 kg)   SpO2 95%   BMI 20.67 kg/m   Physical Exam  Constitutional: He is oriented to person, place, and time. He appears well-developed and well-nourished. No distress.  HENT:  Head: Normocephalic and atraumatic.  Mouth/Throat: Oropharynx is clear and moist.  Neck: Normal range of motion. Neck supple.  Cardiovascular: Normal rate and regular rhythm.  Exam reveals no friction rub.   No murmur heard. Pulmonary/Chest: Effort normal and breath sounds normal. No respiratory distress. He has no wheezes. He has no rales.  Abdominal: Soft. Bowel sounds are normal. He exhibits no distension. There is tenderness. There is  no rebound and no guarding.  There is tenderness to palpation in all 4 quadrants, most notably in the epigastric region.  Musculoskeletal: Normal range of motion. He exhibits no edema.  Neurological: He is alert and oriented to person, place, and time. Coordination normal.  Skin: Skin is warm and dry. He is not diaphoretic.  Nursing note and vitals reviewed.    ED Treatments / Results  Labs (all labs ordered are listed, but only abnormal results are displayed) Labs Reviewed  COMPREHENSIVE METABOLIC PANEL  LIPASE, BLOOD  CBC WITH DIFFERENTIAL/PLATELET    EKG  EKG Interpretation None       Radiology No results found.  Procedures Procedures (including critical care time)  Medications Ordered in ED Medications  sodium chloride 0.9 % bolus 1,000 mL (not administered)  ondansetron (ZOFRAN) injection 4 mg (not administered)  HYDROmorphone (DILAUDID) injection 1 mg (not administered)     Initial Impression / Assessment and Plan / ED Course  I have reviewed the triage vital signs and the nursing notes.  Pertinent labs & imaging results that were available during my care of the patient were reviewed by me and considered in my medical decision making (see chart for details).  Clinical Course     Patient presents with fever and upper abdominal pain. He has a history of pancreatic cancer and is currently undergoing chemotherapy. Today's workup reveals a white count of 1.9 and CT scan shows what appears to be edema of a section of the small bowel consistent with an enteritis.  I've discussed these findings with Dr. Alvy Bimler from oncology who is recommending broad spectrum antibiotics and admission. Dr. Olevia Bowens agrees to admit.  Final Clinical Impressions(s) / ED Diagnoses   Final diagnoses:  None    New Prescriptions New Prescriptions   No medications on file     Veryl Speak, MD 06/13/16 [phone]

## 2016-06-12 NOTE — ED Triage Notes (Signed)
Pt reports having fever along with nausea and vomiting along with diarrhea that started today. Pt currently active chemo pt for pancreatic cancer.

## 2016-06-12 NOTE — ED Notes (Signed)
ED Provider at bedside. 

## 2016-06-13 ENCOUNTER — Encounter (HOSPITAL_COMMUNITY): Payer: Self-pay | Admitting: Internal Medicine

## 2016-06-13 DIAGNOSIS — K56600 Partial intestinal obstruction, unspecified as to cause: Secondary | ICD-10-CM | POA: Diagnosis present

## 2016-06-13 DIAGNOSIS — A419 Sepsis, unspecified organism: Secondary | ICD-10-CM | POA: Diagnosis present

## 2016-06-13 DIAGNOSIS — R197 Diarrhea, unspecified: Secondary | ICD-10-CM | POA: Diagnosis not present

## 2016-06-13 DIAGNOSIS — Z8261 Family history of arthritis: Secondary | ICD-10-CM | POA: Diagnosis not present

## 2016-06-13 DIAGNOSIS — J849 Interstitial pulmonary disease, unspecified: Secondary | ICD-10-CM | POA: Diagnosis not present

## 2016-06-13 DIAGNOSIS — R64 Cachexia: Secondary | ICD-10-CM | POA: Diagnosis present

## 2016-06-13 DIAGNOSIS — D709 Neutropenia, unspecified: Secondary | ICD-10-CM | POA: Diagnosis present

## 2016-06-13 DIAGNOSIS — G4700 Insomnia, unspecified: Secondary | ICD-10-CM | POA: Diagnosis not present

## 2016-06-13 DIAGNOSIS — Z9221 Personal history of antineoplastic chemotherapy: Secondary | ICD-10-CM | POA: Diagnosis not present

## 2016-06-13 DIAGNOSIS — C787 Secondary malignant neoplasm of liver and intrahepatic bile duct: Secondary | ICD-10-CM | POA: Diagnosis present

## 2016-06-13 DIAGNOSIS — N179 Acute kidney failure, unspecified: Secondary | ICD-10-CM | POA: Diagnosis present

## 2016-06-13 DIAGNOSIS — E876 Hypokalemia: Secondary | ICD-10-CM | POA: Diagnosis present

## 2016-06-13 DIAGNOSIS — R109 Unspecified abdominal pain: Secondary | ICD-10-CM

## 2016-06-13 DIAGNOSIS — Z8249 Family history of ischemic heart disease and other diseases of the circulatory system: Secondary | ICD-10-CM | POA: Diagnosis not present

## 2016-06-13 DIAGNOSIS — Z7189 Other specified counseling: Secondary | ICD-10-CM | POA: Diagnosis not present

## 2016-06-13 DIAGNOSIS — T451X5A Adverse effect of antineoplastic and immunosuppressive drugs, initial encounter: Secondary | ICD-10-CM | POA: Diagnosis present

## 2016-06-13 DIAGNOSIS — Z923 Personal history of irradiation: Secondary | ICD-10-CM | POA: Diagnosis not present

## 2016-06-13 DIAGNOSIS — C259 Malignant neoplasm of pancreas, unspecified: Secondary | ICD-10-CM

## 2016-06-13 DIAGNOSIS — Z9981 Dependence on supplemental oxygen: Secondary | ICD-10-CM | POA: Diagnosis not present

## 2016-06-13 DIAGNOSIS — I4891 Unspecified atrial fibrillation: Secondary | ICD-10-CM | POA: Diagnosis not present

## 2016-06-13 DIAGNOSIS — Z6822 Body mass index (BMI) 22.0-22.9, adult: Secondary | ICD-10-CM | POA: Diagnosis not present

## 2016-06-13 DIAGNOSIS — Z515 Encounter for palliative care: Secondary | ICD-10-CM | POA: Diagnosis not present

## 2016-06-13 DIAGNOSIS — R338 Other retention of urine: Secondary | ICD-10-CM | POA: Diagnosis not present

## 2016-06-13 DIAGNOSIS — R509 Fever, unspecified: Secondary | ICD-10-CM | POA: Diagnosis present

## 2016-06-13 DIAGNOSIS — E872 Acidosis: Secondary | ICD-10-CM | POA: Diagnosis present

## 2016-06-13 DIAGNOSIS — R21 Rash and other nonspecific skin eruption: Secondary | ICD-10-CM | POA: Diagnosis present

## 2016-06-13 DIAGNOSIS — J84112 Idiopathic pulmonary fibrosis: Secondary | ICD-10-CM | POA: Diagnosis present

## 2016-06-13 DIAGNOSIS — R5081 Fever presenting with conditions classified elsewhere: Secondary | ICD-10-CM | POA: Diagnosis present

## 2016-06-13 DIAGNOSIS — G629 Polyneuropathy, unspecified: Secondary | ICD-10-CM | POA: Diagnosis present

## 2016-06-13 DIAGNOSIS — L598 Other specified disorders of the skin and subcutaneous tissue related to radiation: Secondary | ICD-10-CM | POA: Diagnosis present

## 2016-06-13 LAB — CBC WITH DIFFERENTIAL/PLATELET
BASOS ABS: 0 10*3/uL (ref 0.0–0.1)
BASOS PCT: 0 %
Eosinophils Absolute: 0 10*3/uL (ref 0.0–0.7)
Eosinophils Relative: 0 %
HEMATOCRIT: 29.3 % — AB (ref 39.0–52.0)
HEMOGLOBIN: 9.6 g/dL — AB (ref 13.0–17.0)
Lymphocytes Relative: 10 %
Lymphs Abs: 0.3 10*3/uL — ABNORMAL LOW (ref 0.7–4.0)
MCH: 28.7 pg (ref 26.0–34.0)
MCHC: 32.8 g/dL (ref 30.0–36.0)
MCV: 87.5 fL (ref 78.0–100.0)
MONOS PCT: 6 %
Monocytes Absolute: 0.2 10*3/uL (ref 0.1–1.0)
NEUTROS ABS: 2.4 10*3/uL (ref 1.7–7.7)
NEUTROS PCT: 84 %
Platelets: 123 10*3/uL — ABNORMAL LOW (ref 150–400)
RBC: 3.35 MIL/uL — ABNORMAL LOW (ref 4.22–5.81)
RDW: 15.6 % — ABNORMAL HIGH (ref 11.5–15.5)
WBC: 2.8 10*3/uL — ABNORMAL LOW (ref 4.0–10.5)

## 2016-06-13 LAB — COMPREHENSIVE METABOLIC PANEL
ALBUMIN: 2.4 g/dL — AB (ref 3.5–5.0)
ALK PHOS: 128 U/L — AB (ref 38–126)
ALT: 13 U/L — AB (ref 17–63)
AST: 28 U/L (ref 15–41)
Anion gap: 4 — ABNORMAL LOW (ref 5–15)
BILIRUBIN TOTAL: 0.4 mg/dL (ref 0.3–1.2)
BUN: 11 mg/dL (ref 6–20)
CALCIUM: 7.4 mg/dL — AB (ref 8.9–10.3)
CO2: 25 mmol/L (ref 22–32)
Chloride: 105 mmol/L (ref 101–111)
Creatinine, Ser: 0.71 mg/dL (ref 0.61–1.24)
GFR calc Af Amer: >60 mL/min (ref >60)
GFR calc non Af Amer: >60 mL/min (ref >60)
GLUCOSE: 127 mg/dL — AB (ref 65–99)
Potassium: 4.2 mmol/L (ref 3.5–5.1)
Sodium: 134 mmol/L — ABNORMAL LOW (ref 135–145)
TOTAL PROTEIN: 5.2 g/dL — AB (ref 6.5–8.1)

## 2016-06-13 LAB — LACTATE DEHYDROGENASE: LDH: 81 U/L — ABNORMAL LOW (ref 98–192)

## 2016-06-13 LAB — URINALYSIS, ROUTINE W REFLEX MICROSCOPIC
Bilirubin Urine: NEGATIVE
GLUCOSE, UA: NEGATIVE mg/dL
HGB URINE DIPSTICK: NEGATIVE
Ketones, ur: NEGATIVE mg/dL
LEUKOCYTES UA: NEGATIVE
Nitrite: NEGATIVE
PH: 6 (ref 5.0–8.0)
Protein, ur: NEGATIVE mg/dL
SPECIFIC GRAVITY, URINE: 1.044 — AB (ref 1.005–1.030)

## 2016-06-13 LAB — INFLUENZA PANEL BY PCR (TYPE A & B)
Influenza A By PCR: NEGATIVE
Influenza B By PCR: NEGATIVE

## 2016-06-13 LAB — C DIFFICILE QUICK SCREEN W PCR REFLEX
C DIFFICILE (CDIFF) INTERP: NOT DETECTED
C DIFFICILE (CDIFF) TOXIN: NEGATIVE
C Diff antigen: NEGATIVE

## 2016-06-13 LAB — MAGNESIUM: MAGNESIUM: 1.6 mg/dL — AB (ref 1.7–2.4)

## 2016-06-13 LAB — MRSA PCR SCREENING: MRSA by PCR: NEGATIVE

## 2016-06-13 LAB — LACTIC ACID, PLASMA
Lactic Acid, Venous: 2.6 mmol/L (ref 0.5–1.9)
Lactic Acid, Venous: 1.5 mmol/L (ref 0.5–1.9)

## 2016-06-13 MED ORDER — DIPHENHYDRAMINE HCL 50 MG/ML IJ SOLN: 12.5000 mg | Freq: Four times a day (QID) | INTRAMUSCULAR | Status: DC | PRN

## 2016-06-13 MED ORDER — ONDANSETRON HCL 4 MG/2ML IJ SOLN
4.0000 mg | Freq: Four times a day (QID) | INTRAMUSCULAR | Status: DC | PRN
Start: 2016-06-13 — End: 2016-06-16

## 2016-06-13 MED ORDER — ONDANSETRON HCL 4 MG PO TABS: 4.0000 mg | ORAL_TABLET | Freq: Four times a day (QID) | ORAL | Status: DC | PRN

## 2016-06-13 MED ORDER — HYDROMORPHONE HCL 2 MG/ML IJ SOLN
3.0000 mg | INTRAMUSCULAR | Status: DC | PRN
  Administered 2016-06-13 – 2016-06-14 (×4): 3 mg via INTRAVENOUS
  Filled 2016-06-13 (×4): qty 2

## 2016-06-13 MED ORDER — ACETAMINOPHEN 325 MG PO TABS
650.0000 mg | ORAL_TABLET | ORAL | Status: DC | PRN
  Administered 2016-06-13 – 2016-06-16 (×6): 650 mg via ORAL
  Filled 2016-06-13 (×4): qty 2

## 2016-06-13 MED ORDER — NALOXONE HCL 0.4 MG/ML IJ SOLN: 0.4000 mg | INTRAMUSCULAR | Status: DC | PRN

## 2016-06-13 MED ORDER — ONDANSETRON HCL 4 MG/2ML IJ SOLN
4.0000 mg | Freq: Four times a day (QID) | INTRAMUSCULAR | Status: DC | PRN
  Administered 2016-06-18: 4 mg via INTRAVENOUS
  Filled 2016-06-13: qty 2

## 2016-06-13 MED ORDER — DIPHENHYDRAMINE HCL 12.5 MG/5ML PO ELIX: 12.5000 mg | ORAL_SOLUTION | Freq: Four times a day (QID) | ORAL | Status: DC | PRN

## 2016-06-13 MED ORDER — ORAL CARE MOUTH RINSE
15.0000 mL | Freq: Two times a day (BID) | OROMUCOSAL | Status: DC
  Administered 2016-06-13 – 2016-06-21 (×15): 15 mL via OROMUCOSAL

## 2016-06-13 MED ORDER — SODIUM CHLORIDE 0.9 % IV SOLN
INTRAVENOUS | Status: AC
  Administered 2016-06-13: 01:00:00 via INTRAVENOUS

## 2016-06-13 MED ORDER — FAMOTIDINE IN NACL 20-0.9 MG/50ML-% IV SOLN
20.0000 mg | Freq: Two times a day (BID) | INTRAVENOUS | Status: DC
  Administered 2016-06-13 – 2016-06-16 (×8): 20 mg via INTRAVENOUS
  Filled 2016-06-13 (×8): qty 50

## 2016-06-13 MED ORDER — VANCOMYCIN HCL IN DEXTROSE 1-5 GM/200ML-% IV SOLN
1000.0000 mg | Freq: Two times a day (BID) | INTRAVENOUS | Status: DC
  Administered 2016-06-13 – 2016-06-14 (×2): 1000 mg via INTRAVENOUS
  Filled 2016-06-13 (×2): qty 200

## 2016-06-13 MED ORDER — HYDROMORPHONE 1 MG/ML IV SOLN
INTRAVENOUS | Status: DC
  Administered 2016-06-13: 14:00:00 via INTRAVENOUS
  Administered 2016-06-13: 0.6 mg via INTRAVENOUS
  Administered 2016-06-13: 1.2 mg via INTRAVENOUS
  Administered 2016-06-14: 0.9 mg via INTRAVENOUS
  Administered 2016-06-14: 3.6 mg via INTRAVENOUS
  Administered 2016-06-14 (×2): 3 mg via INTRAVENOUS
  Administered 2016-06-14: 0.3 mg via INTRAVENOUS
  Administered 2016-06-14: 2.1 mg via INTRAVENOUS
  Administered 2016-06-15: 2.4 mg via INTRAVENOUS
  Administered 2016-06-15: 13:00:00 via INTRAVENOUS
  Administered 2016-06-15: 1.8 mg via INTRAVENOUS
  Administered 2016-06-15: 1.5 mg via INTRAVENOUS
  Administered 2016-06-15: 0.9 mg via INTRAVENOUS
  Administered 2016-06-15: 0.6 mg via INTRAVENOUS
  Administered 2016-06-15: 1.5 mg via INTRAVENOUS
  Administered 2016-06-16: 0.3 mg via INTRAVENOUS
  Administered 2016-06-16: 1.5 mg via INTRAVENOUS
  Administered 2016-06-16: 0.9 mg via INTRAVENOUS
  Filled 2016-06-13 (×2): qty 25

## 2016-06-13 MED ORDER — SODIUM CHLORIDE 0.9% FLUSH
3.0000 mL | Freq: Two times a day (BID) | INTRAVENOUS | Status: DC
  Administered 2016-06-13 – 2016-06-18 (×6): 3 mL via INTRAVENOUS

## 2016-06-13 MED ORDER — POTASSIUM CHLORIDE CRYS ER 20 MEQ PO TBCR
40.0000 meq | EXTENDED_RELEASE_TABLET | Freq: Two times a day (BID) | ORAL | Status: AC
  Administered 2016-06-13 (×2): 40 meq via ORAL
  Filled 2016-06-13 (×2): qty 2

## 2016-06-13 MED ORDER — ACETAMINOPHEN 325 MG PO TABS
650.0000 mg | ORAL_TABLET | Freq: Four times a day (QID) | ORAL | Status: DC | PRN
  Filled 2016-06-13: qty 2

## 2016-06-13 MED ORDER — DEXTROSE 5 % IV SOLN
2.0000 g | Freq: Three times a day (TID) | INTRAVENOUS | Status: AC
  Administered 2016-06-13 – 2016-06-19 (×21): 2 g via INTRAVENOUS
  Filled 2016-06-13 (×22): qty 2

## 2016-06-13 MED ORDER — HYDROMORPHONE HCL 2 MG/ML IJ SOLN
4.0000 mg | Freq: Once | INTRAMUSCULAR | Status: AC
  Administered 2016-06-13: 2 mg via INTRAVENOUS
  Filled 2016-06-13: qty 2

## 2016-06-13 MED ORDER — HYDROMORPHONE HCL 2 MG/ML IJ SOLN
2.0000 mg | Freq: Once | INTRAMUSCULAR | Status: AC
  Administered 2016-06-13: 2 mg via INTRAVENOUS
  Filled 2016-06-13: qty 1

## 2016-06-13 MED ORDER — OSELTAMIVIR PHOSPHATE 75 MG PO CAPS
75.0000 mg | ORAL_CAPSULE | Freq: Two times a day (BID) | ORAL | Status: DC
  Filled 2016-06-13: qty 1

## 2016-06-13 MED ORDER — VANCOMYCIN HCL IN DEXTROSE 1-5 GM/200ML-% IV SOLN
1000.0000 mg | Freq: Once | INTRAVENOUS | Status: AC
  Administered 2016-06-13: 1000 mg via INTRAVENOUS
  Filled 2016-06-13: qty 200

## 2016-06-13 MED ORDER — SODIUM CHLORIDE 0.9 % IV SOLN
INTRAVENOUS | Status: DC
  Administered 2016-06-13 – 2016-06-19 (×13): via INTRAVENOUS

## 2016-06-13 MED ORDER — SODIUM CHLORIDE 0.9% FLUSH: 9.0000 mL | INTRAVENOUS | Status: DC | PRN

## 2016-06-13 MED ORDER — SODIUM CHLORIDE 0.9 % IV BOLUS (SEPSIS)
1000.0000 mL | Freq: Once | INTRAVENOUS | Status: AC
  Administered 2016-06-13: 1000 mL via INTRAVENOUS

## 2016-06-13 MED ORDER — HYDROMORPHONE HCL 2 MG/ML IJ SOLN: 4.0000 mg | INTRAMUSCULAR | Status: DC | PRN

## 2016-06-13 MED ORDER — POTASSIUM CHLORIDE IN NACL 20-0.9 MEQ/L-% IV SOLN
INTRAVENOUS | Status: DC
  Administered 2016-06-13: 04:00:00 via INTRAVENOUS
  Filled 2016-06-13: qty 1000

## 2016-06-13 MED ORDER — SODIUM CHLORIDE 0.9 % IV SOLN
Freq: Once | INTRAVENOUS | Status: AC
  Administered 2016-06-13: 06:00:00 via INTRAVENOUS

## 2016-06-13 MED ORDER — HYDROMORPHONE HCL 1 MG/ML IJ SOLN
1.0000 mg | Freq: Once | INTRAMUSCULAR | Status: AC
  Administered 2016-06-13: 1 mg via INTRAVENOUS
  Filled 2016-06-13: qty 1

## 2016-06-13 MED ORDER — DEXTROSE 5 % IV SOLN
2.0000 g | Freq: Once | INTRAVENOUS | Status: AC
  Administered 2016-06-13: 2 g via INTRAVENOUS
  Filled 2016-06-13: qty 2

## 2016-06-13 MED ORDER — SODIUM CHLORIDE 0.9 % IV SOLN: Freq: Once | INTRAVENOUS | Status: DC

## 2016-06-13 MED ORDER — MAGNESIUM SULFATE 2 GM/50ML IV SOLN
2.0000 g | Freq: Once | INTRAVENOUS | Status: AC
  Administered 2016-06-13: 2 g via INTRAVENOUS
  Filled 2016-06-13: qty 50

## 2016-06-13 NOTE — Progress Notes (Signed)
Patient's BP was checked this AM and it was 73/50. HR in the 70's. On call made aware and new orders given to get a lactic acid and give a 1000cc bolus.

## 2016-06-13 NOTE — Progress Notes (Signed)
[facility] PROGRESS NOTE    Progress Note  Roy English  [id number] DOB: [birth year] DOA: 06/12/2016 PCP: Golden Pop, MD     Brief Narrative:   Roy English is an 65 y.o. 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 Patient woke up on the morning of admission starting having emesis 5-6 loose stools for the past 2 days. In the ED was found to have hypotensive post hydromorphone so he was started on normal saline  Assessment/Plan:   Sepsis due to undetermined organism/Febrile neutropenia [facility]): I agree with IV cefepime, his lactic acid is mildly elevated we'll go ahead and re-start him on aggressive IV fluid hydration. Blood cultures and urine cultures are pending. unlikely cholangitis with normal LFTs. More concerned about gastroenteritis CT scan of the abdomen and pelvis done on 06/12/2016 showed right abdomen show irregular wall thickening with perienteric edema/ inflammation. Check C. Dif PCR, continues to have watery ongoing diarrhea He is sharing with chills start Tamiflu, and will check an influenza PCR. His ANC is 2.4.  Watery diarrhea: Check c. Dif PCR, multiple risk factor. Contact precaution.  Interstitial lung disease ([place]) Continue supplemental oxygen.  Hypokalemia Likely due to hypovolemia continue oral mentation, replete magnesium IV.  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   DVT prophylaxis: lovenox Family Communication:none Disposition Plan/Barrier to D/C: Unable to determined. Code Status:     Code Status Orders        Start     Ordered   06/13/16 0253  Full code  Continuous     06/13/16 0252    Code Status History    Date Active Date Inactive Code Status Order ID Comments User Context    09/21/2015  5:54 PM 09/24/2015  4:46 PM Full Code [id number]  Theodis Blaze, MD Inpatient    Advance Directive Documentation   Flowsheet Row Most Recent Value  Type of Advance Directive  Healthcare Power of Attorney  Pre-existing out of facility DNR order (yellow form or pink MOST form)  No data  "MOST" Form in Place?  No data        IV Access:    Peripheral IV   Procedures and diagnostic studies:   Ct Abdomen Pelvis W Contrast  Result Date: 06/12/2016 CLINICAL DATA:  Fever with nausea and vomiting along with diarrhea today. Pancreatic cancer. EXAM: CT ABDOMEN AND PELVIS WITH CONTRAST TECHNIQUE: Multidetector CT imaging of the abdomen and pelvis was performed using the standard protocol following bolus administration of intravenous contrast. CONTRAST:  120mL ISOVUE-300 IOPAMIDOL (ISOVUE-300) INJECTION 61% COMPARISON:  04/02/2016 FINDINGS: Lower chest:  Interstitial lung disease seen in the bases is stable. Hepatobiliary: Multiple hepatic metastases are again noted. 2.7 cm lesion in the dome of the liver today was 2.1 cm previously. 3.1 cm right hepatic lesion seen on image 13 series 2 of today's exam measured 2.3 cm previously. A 2.8 x 2.3 cm lesion in the medial dome measured on the prior study is now 2.8 x 2.9 cm. Other metastases of similar size appear progressed. Gallbladder wall shows some ill definition and mild thickening. No intrahepatic or extrahepatic biliary dilation. Pancreas: Common bile duct stent seen previously is no longer evident. Hypo attenuating lesion in the pancreatic head measures 1.8 x 1.5 cm today compared to 2.4 x 3.2  cm previously. No substantial dilatation of the main pancreatic duct. Spleen: No splenomegaly. No focal mass lesion. Adrenals/Urinary Tract: No adrenal nodule or mass. 1.4 cm exophytic enhancing lesion identified interpolar right kidney posteriorly (see image 35 series 2) compared to 1.2 cm same axis previously. Left kidney unremarkable No evidence for  hydroureter. Mild bladder wall thickening is seen most notably at the dome. Stomach/Bowel: Stomach is nondistended. No gastric wall thickening. No evidence of outlet obstruction. Duodenum is normally positioned as is the ligament of Treitz. No evidence for small bowel dilatation. Small bowel loops in the right abdomen (see image 38 series 2) show ill-defined wall thickening with perienteric edema/ inflammation and associated interloop mesenteric fluid these are probably ileal loops, but the terminal ileum appears relatively preserved. Appendix not well seen. Minimal diverticular changes noted left colon. Vascular/Lymphatic: There is abdominal aortic atherosclerosis without aneurysm. There is no gastrohepatic or hepatoduodenal ligament lymphadenopathy. No intraperitoneal or retroperitoneal lymphadenopathy. No pelvic sidewall lymphadenopathy. Reproductive: Prostate gland is enlarged. Other: Small to moderate volume free fluid noted in the pelvis with a tiny amount of fluid seen adjacent to the liver and in the right para colic gutter. Musculoskeletal: Bone windows reveal no worrisome lytic or sclerotic osseous lesions. IMPRESSION: 1. Areas of mid to distal small bowel in the right abdomen show irregular wall thickening with perienteric edema/ inflammation. Infectious/inflammatory enteritis would be a consideration. 2.  Interval progression of hepatic metastases. 3. Interval continued decrease in size of the pancreatic head mass. 4. No substantial change in the enhancing interpolar right renal lesion, suspicious for primary renal cell carcinoma. 5. Small to moderate volume ascites, progressive in the interval. 6. Abdominal aortic atherosclerosis. Electronically Signed   By: Misty Stanley M.D.   On: 06/12/2016 23:50     Medical Consultants:    None.  Anti-Infectives:   Cefepime 11.11.2017  Subjective:    Roy English he relates he is shivering and feels tired, he had no slightly watery diarrhea, he  relates he's had about 5 watery bowel movements in the last 12 hours.  Objective:    Vitals:   06/13/16 0239 06/13/16 0505 06/13/16 0532 06/13/16 0642  BP: (!) 89/55 (!) 76/53 (!) 73/50 (!) 76/52  Pulse: 77 70    Resp: 16 16    Temp: 98.2 F (36.8 C) 98 F (36.7 C)    TempSrc: Oral Oral    SpO2: 97% 96%    Weight: 69.3 kg (152 lb 11.2 oz)     Height: 5\' 9"  (1.753 m)       Intake/Output Summary (Last 24 hours) at 06/13/16 0814 Last data filed at 06/13/16 0600  Gross per 24 hour  Intake          2439.58 ml  Output                0 ml  Net          2439.58 ml   Filed Weights   06/12/16 2150 06/13/16 0239  Weight: 63.5 kg (140 lb) 69.3 kg (152 lb 11.2 oz)    Exam: General exam: In no acute distress. Respiratory system: Good air movement and clear to auscultation. Cardiovascular system: S1 & S2 heard, RRR. No JVD, murmurs, rubs, gallops or clicks.  Gastrointestinal system: Significant diffuse abdominal pain with guarding no rebound tenderness. Extremities: No pedal edema. Skin: No rashes, lesions or ulcers Psychiatry: Judgement and insight appear normal. Mood & affect appropriate.    Data Reviewed:    Labs: Basic  Metabolic Panel:  Recent Labs Lab 06/07/16 1132 06/12/16 2239 06/13/16 0218 06/13/16 0535  NA 140 137  --  134*  K 3.9 3.4*  --  4.2  CL  --  103  --  105  CO2 29 26  --  25  GLUCOSE 82 129*  --  127*  BUN 7.7 13  --  11  CREATININE 0.7 0.94  --  0.71  CALCIUM 8.5 8.4*  --  7.4*  MG  --   --  1.6*  --    GFR Estimated Creatinine Clearance: 91.4 mL/min (by C-G formula based on SCr of 0.71 mg/dL). Liver Function Tests:  Recent Labs Lab 06/07/16 1132 06/12/16 2239 06/13/16 0535  AST 21 40 28  ALT 9 18 13*  ALKPHOS 215* 189* 128*  BILITOT <0.22 0.7 0.4  PROT 6.5 6.6 5.2*  ALBUMIN 2.8* 3.3* 2.4*    Recent Labs Lab 06/12/16 2239  LIPASE 12   No results for input(s): AMMONIA in the last 168 hours. Coagulation profile No results for  input(s): INR, PROTIME in the last 168 hours.  CBC:  Recent Labs Lab 06/07/16 1132 06/12/16 2239 06/13/16 0535  WBC 3.6* 1.9* 2.8*  NEUTROABS 2.4 1.5* 2.4  HGB 11.5* 12.4* 9.6*  HCT 35.8* 37.7* 29.3*  MCV 88.8 87.7 87.5  PLT 224 167 123*   Cardiac Enzymes: No results for input(s): CKTOTAL, CKMB, CKMBINDEX, TROPONINI in the last 168 hours. BNP (last 3 results) No results for input(s): PROBNP in the last 8760 hours. CBG: No results for input(s): GLUCAP in the last 168 hours. D-Dimer: No results for input(s): DDIMER in the last 72 hours. Hgb A1c: No results for input(s): HGBA1C in the last 72 hours. Lipid Profile: No results for input(s): CHOL, HDL, LDLCALC, TRIG, CHOLHDL, LDLDIRECT in the last 72 hours. Thyroid function studies: No results for input(s): TSH, T4TOTAL, T3FREE, THYROIDAB in the last 72 hours.  Invalid input(s): FREET3 Anemia work up: No results for input(s): VITAMINB12, FOLATE, FERRITIN, TIBC, IRON, RETICCTPCT in the last 72 hours. Sepsis Labs:  Recent Labs Lab 06/07/16 1132 06/12/16 2239 06/13/16 0535 06/13/16 0545  WBC 3.6* 1.9* 2.8*  --   LATICACIDVEN  --   --   --  2.6*   Microbiology No results found for this or any previous visit (from the past 240 hour(s)).   Medications:   . ceFEPime (MAXIPIME) IV  2 g Intravenous Q8H  . famotidine (PEPCID) IV  20 mg Intravenous Q12H  . mouth rinse  15 mL Mouth Rinse BID  . sodium chloride flush  3 mL Intravenous Q12H  . vancomycin  1,000 mg Intravenous Q12H   Continuous Infusions: . 0.9 % NaCl with KCl 20 mEq / L 125 mL/hr at 06/13/16 0353    Time spent:25 min   LOS: 0 days   Charlynne Cousins  [facility] Pager [phone]  *Please refer to [email or web address], password [email or web address] to get updated schedule on who will round on this patient, as hospitalists switch teams weekly. If 7PM-7AM, please contact night-coverage at [email or web address], password [email or web address] for any overnight needs.  06/13/2016, 8:14 AM

## 2016-06-13 NOTE — Progress Notes (Signed)
Patient came up from the ED in 7/10 abdominal pain and was asking for pain medicine. BP was 89/55. HR in the 70's. On call notified and new orders were given for a one time dose of dilaudid IV.

## 2016-06-13 NOTE — Progress Notes (Signed)
CRITICAL VALUE ALERT  Critical value received:  Lactic acid 2.6  Date of notification:  06/13/16  Time of notification:  0630  Critical value read back:Yes.    Nurse who received alert:  Reynold Bowen, RN  MD notified (1st page): Doreene Burke  Time of first page:  [phone]  MD notified (2nd page):  Time of second page:  Responding MD:  Doreene Burke  Time MD responded:  [phone]  4th liter of fluid going in patient now. BP rechecked at 0645, BP 76/52. [place] made aware and stated to recheck in an hr.

## 2016-06-13 NOTE — Progress Notes (Signed)
Patient's mag level came back at 1.6. Also, patient told RN he had not urinated since noon. RN bladder scanned patient and it showed less than 100cc. On call notified and made aware. No new orders given.

## 2016-06-13 NOTE — Progress Notes (Signed)
Pharmacy Antibiotic Note  SHAHAB English is a 65 y.o. male admitted on 06/12/2016 with Febrile neutropenia .  Pharmacy has been consulted for Vancomycin, cefepime dosing.  Plan: Vancomycin 1gm IV every 12 hours.  Goal trough 15-20 mcg/mL.  Cefepime 2gm iv q8hr  Height: 5\' 9"  (175.3 cm) Weight: 152 lb 11.2 oz (69.3 kg) IBW/kg (Calculated) : 70.7  Temp (24hrs), Avg:98.1 F (36.7 C), Min:97.9 F (36.6 C), Max:98.2 F (36.8 C)   Recent Labs Lab 06/07/16 1132 06/07/16 1132 06/12/16 2239  WBC 3.6*  --  1.9*  CREATININE  --  0.7 0.94    Estimated Creatinine Clearance: 77.8 mL/min (by C-G formula based on SCr of 0.94 mg/dL).    Allergies  Allergen Reactions  . Penicillins Other (See Comments)    Reaction:  Unknown  Has patient had a PCN reaction causing immediate rash, facial/tongue/throat swelling, SOB or lightheadedness with hypotension: Unsure Has patient had a PCN reaction causing severe rash involving mucus membranes or skin necrosis: Unsure Has patient had a PCN reaction that required hospitalization Unsure Has patient had a PCN reaction occurring within the last 10 years: No If all of the above answers are "NO", then may proceed with Cephalosporin use.    Antimicrobials this admission: Vancomycin 06/13/2016 >> Cefepime 06/13/2016 >>   Dose adjustments this admission: -  Microbiology results: pending  Thank you for allowing pharmacy to be a part of this patient's care.  Roy English 06/13/2016 4:16 AM

## 2016-06-13 NOTE — H&P (Signed)
History and Physical    Roy English [id number] DOB: Mar 31, 1951 DOA: 06/12/2016  PCP: Golden Pop, MD   Patient coming from: Home.  Chief Complaint: Fever.   HPI: Roy English is a 65 y.o. 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   Per patient, his last chemotherapy was on 06/07/2016. He did not feel anything out of the ordinary following treatment, on till yesterday when he had worsening of his chronic abdominal pain, a mild cough and felt dyspneic. Today he woke up feeling worse, having nausea and 3 episodes of emesis. He has also had 5-6 loose stools DM in the past 2 days. He denies headaches, earaches, sore throat, but complains of rhinorrhea and clear sputum production and coughing. He denies chest pain, pitting edema of the lower extremities, PND or orthopnea. He complains of palpitations and postural dizziness. He is stated that he was slow to start urination, but denies dysuria, frequency or hematuria.   ED Course: The patient received hydromorphone 1 mg IVP, ondansetron 4 mg IVP, cefepime and vancomycin IVPB. He has also 3 L of normal saline bolus to this point. Famotidine 20 mg IVP every 12 hours as ordered in the ER. WBC was 1.9, hemoglobin 12.5 g/dL, potassium 3.4 mmol/L, glucose 129 mg/dL. He stated that he was feeling better, but was hypotensive post hydromorphone injection. He is stated that he normally runs a low blood pressure.  Review of Systems: As per HPI otherwise 10 point review of systems negative.    Past Medical History:  Diagnosis Date  . Actinic keratosis   . Arthritis   . 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 Dilated cbd, acquired   . ED (erectile dysfunction)   .  Elevated LFTs   . Environmental allergies   . Idiopathic interstitial fibrosis of lung syndrome ([place])   . S/P radiation therapy 11/24/15 completed    pancreas 50.4Gy    Past Surgical History:  Procedure Laterality Date  . [facility] Separation Surgery Bilateral   . BILIARY STENT PLACEMENT N/A 09/23/2015   Procedure: BILIARY STENT PLACEMENT;  Surgeon: Milus Banister, MD;  Location: [facility];  Service: Endoscopy;  Laterality: N/A;  . carpal tunnel surgery Bilateral   . ERCP N/A 09/08/2015   Procedure: ENDOSCOPIC RETROGRADE CHOLANGIOPANCREATOGRAPHY (ERCP);  Surgeon: Gatha Mayer, MD;  Location: [facility];  Service: Endoscopy;  Laterality: N/A;  . ERCP N/A 09/23/2015   Procedure: ENDOSCOPIC RETROGRADE CHOLANGIOPANCREATOGRAPHY (ERCP);  Surgeon: Milus Banister, MD;  Location: [facility];  Service: Endoscopy;  Laterality: N/A;  with stent exchange  . KNEE ARTHROSCOPY Right   . multiple surgeries     ribs,arms,nose,fingers  . skin cancer removal      face and chest     reports that he has never smoked. He has never used smokeless tobacco. He reports that he drinks alcohol. He reports that he does not use drugs.  Allergies  Allergen Reactions  . Penicillins Other (See Comments)    Reaction:  Unknown  Has patient had a PCN reaction causing immediate rash, facial/tongue/throat swelling, SOB or lightheadedness with hypotension: Unsure Has patient had a PCN reaction causing severe rash involving mucus membranes or skin necrosis: Unsure Has patient had a PCN reaction that required hospitalization Unsure Has patient  had a PCN reaction occurring within the last 10 years: No If all of the above answers are "NO", then may proceed with Cephalosporin use.    Family History  Problem Relation Age of Onset  . Heart disease Mother   . Rheum arthritis Mother   . Cancer      both sides of the family, type unknown    Prior to Admission medications   Medication Sig Start Date End Date Taking?  Authorizing Provider  acetaminophen (TYLENOL) 500 MG tablet Take 1,000 mg by mouth every 6 (six) hours as needed for mild pain, moderate pain or headache.   Yes Historical Provider, MD  Bisacodyl (DULCOLAX PO) Take 1 tablet by mouth 2 (two) times daily.    Yes Historical Provider, MD  HYDROmorphone (DILAUDID) 2 MG tablet Take 1-2 tablets (2-4 mg total) by mouth every 4 (four) hours as needed for severe pain. 06/07/16  Yes Owens Shark, NP  lactose free nutrition (BOOST) LIQD Take 237 mLs by mouth 2 (two) times daily between meals.   Yes Historical Provider, MD  lidocaine-prilocaine (EMLA) cream Apply 1 application topically as needed (prior to accessing port).   Yes Historical Provider, MD  LORazepam (ATIVAN) 0.5 MG tablet TAKE 1 TABLET BY MOUTH EVERY 4 HOURS AS NEEDED FOR ANXIETY/NAUSEA 05/17/16  Yes Owens Shark, NP  mirtazapine (REMERON) 15 MG tablet Take 1 tablet (15 mg total) by mouth at bedtime. 04/23/16  Yes Owens Shark, NP  omeprazole (PRILOSEC) 20 MG capsule Take 20 mg by mouth 2 (two) times daily.    Yes Historical Provider, MD  ondansetron (ZOFRAN) 8 MG tablet Take 1 tablet (8 mg total) by mouth every 6 (six) hours as needed for nausea or vomiting. Patient taking differently: Take 8 mg by mouth 2 (two) times daily.  01/12/16  Yes Ladell Pier, MD  Pancrelipase, Lip-Prot-Amyl, (CREON) 3000-9500 units CPEP Take 3 capsules by mouth 3 (three) times daily with meals. 04/08/16  Yes Historical Provider, MD  polyethylene glycol (MIRALAX / GLYCOLAX) packet Take 17 g by mouth daily.    Yes Historical Provider, MD  prochlorperazine (COMPAZINE) 10 MG tablet Take 10 mg by mouth every 6 (six) hours as needed for nausea or vomiting.   Yes Historical Provider, MD    Physical Exam:  Constitutional: NAD, calm, comfortable Vitals:   06/12/16 2230 06/12/16 2300 06/12/16 2348 06/13/16 0106  BP: (!) 86/62 (!) 80/56 (!) 90/52 (!) 85/50  Pulse: 90 78    Resp: (!) 27 14    Temp:      TempSrc:        SpO2: 96% 95%    Weight:      Height:       Eyes: PERRL, lids and conjunctivae normal ENMT: Mucous membranes and lips are dry. Posterior pharynx clear of any exudate or lesions. Dentition shows signs of previous extensive repair work.  Neck: normal, supple, no masses, no thyromegaly Respiratory: clear to auscultation bilaterally, no wheezing, no crackles. Normal respiratory effort. No accessory muscle use.  Cardiovascular: Regular rate and rhythm, no murmurs / rubs / gallops. No extremity edema. 2+ pedal pulses. No carotid bruits.  Abdomen: Soft, positive epigastric and lower quadrants tenderness, no guarding/rebound/masses palpated. No hepatosplenomegaly. Bowel sounds positive.  Musculoskeletal: no clubbing / cyanosis. No joint deformity upper and lower extremities. Good ROM, no contractures. Normal muscle tone.  Skin: no rashes, lesions, ulcers. No induration Neurologic: CN 2-12 grossly intact. Sensation intact, DTR normal. Strength 5/5 in all  4.  Psychiatric: Normal judgment and insight. Alert and oriented x 4. Normal mood.    Labs on Admission: I have personally reviewed following labs and imaging studies  CBC:  Recent Labs Lab 06/07/16 1132 06/12/16 2239  WBC 3.6* 1.9*  NEUTROABS 2.4 1.5*  HGB 11.5* 12.4*  HCT 35.8* 37.7*  MCV 88.8 87.7  PLT 224 [id number]   Basic Metabolic Panel:  Recent Labs Lab 06/07/16 1132 06/12/16 2239  NA 140 137  K 3.9 3.4*  CL  --  103  CO2 29 26  GLUCOSE 82 129*  BUN 7.7 13  CREATININE 0.7 0.94  CALCIUM 8.5 8.4*   GFR: Estimated Creatinine Clearance: 71.3 mL/min (by C-G formula based on SCr of 0.94 mg/dL). Liver Function Tests:  Recent Labs Lab 06/07/16 1132 06/12/16 2239  AST 21 40  ALT 9 18  ALKPHOS 215* 189*  BILITOT <0.22 0.7  PROT 6.5 6.6  ALBUMIN 2.8* 3.3*    Recent Labs Lab 06/12/16 2239  LIPASE 12   No results for input(s): AMMONIA in the last 168 hours. Coagulation Profile: No results for input(s): INR, PROTIME  in the last 168 hours. Cardiac Enzymes: No results for input(s): CKTOTAL, CKMB, CKMBINDEX, TROPONINI in the last 168 hours. BNP (last 3 results) No results for input(s): PROBNP in the last 8760 hours. HbA1C: No results for input(s): HGBA1C in the last 72 hours. CBG: No results for input(s): GLUCAP in the last 168 hours. Lipid Profile: No results for input(s): CHOL, HDL, LDLCALC, TRIG, CHOLHDL, LDLDIRECT in the last 72 hours. Thyroid Function Tests: No results for input(s): TSH, T4TOTAL, FREET4, T3FREE, THYROIDAB in the last 72 hours. Anemia Panel: No results for input(s): VITAMINB12, FOLATE, FERRITIN, TIBC, IRON, RETICCTPCT in the last 72 hours. Urine analysis:    Component Value Date/Time   COLORURINE YELLOW 02/22/2016 2013   APPEARANCEUR CLEAR 02/22/2016 2013   APPEARANCEUR Clear 08/29/2015 1110   LABSPEC 1.005 02/22/2016 2013   PHURINE 7.0 02/22/2016 2013   GLUCOSEU NEGATIVE 02/22/2016 2013   HGBUR NEGATIVE 02/22/2016 2013   BILIRUBINUR NEGATIVE 02/22/2016 2013   BILIRUBINUR Negative 08/29/2015 [place] 02/22/2016 2013   PROTEINUR NEGATIVE 02/22/2016 2013   NITRITE NEGATIVE 02/22/2016 2013   LEUKOCYTESUR NEGATIVE 02/22/2016 2013   LEUKOCYTESUR Negative 08/29/2015 1110    Radiological Exams on Admission: Ct Abdomen Pelvis W Contrast  Result Date: 06/12/2016 CLINICAL DATA:  Fever with nausea and vomiting along with diarrhea today. Pancreatic cancer. EXAM: CT ABDOMEN AND PELVIS WITH CONTRAST TECHNIQUE: Multidetector CT imaging of the abdomen and pelvis was performed using the standard protocol following bolus administration of intravenous contrast. CONTRAST:  153mL ISOVUE-300 IOPAMIDOL (ISOVUE-300) INJECTION 61% COMPARISON:  04/02/2016 FINDINGS: Lower chest:  Interstitial lung disease seen in the bases is stable. Hepatobiliary: Multiple hepatic metastases are again noted. 2.7 cm lesion in the dome of the liver today was 2.1 cm previously. 3.1 cm right hepatic  lesion seen on image 13 series 2 of today's exam measured 2.3 cm previously. A 2.8 x 2.3 cm lesion in the medial dome measured on the prior study is now 2.8 x 2.9 cm. Other metastases of similar size appear progressed. Gallbladder wall shows some ill definition and mild thickening. No intrahepatic or extrahepatic biliary dilation. Pancreas: Common bile duct stent seen previously is no longer evident. Hypo attenuating lesion in the pancreatic head measures 1.8 x 1.5 cm today compared to 2.4 x 3.2 cm previously. No substantial dilatation of the main pancreatic duct. Spleen: No splenomegaly.  No focal mass lesion. Adrenals/Urinary Tract: No adrenal nodule or mass. 1.4 cm exophytic enhancing lesion identified interpolar right kidney posteriorly (see image 35 series 2) compared to 1.2 cm same axis previously. Left kidney unremarkable No evidence for hydroureter. Mild bladder wall thickening is seen most notably at the dome. Stomach/Bowel: Stomach is nondistended. No gastric wall thickening. No evidence of outlet obstruction. Duodenum is normally positioned as is the ligament of Treitz. No evidence for small bowel dilatation. Small bowel loops in the right abdomen (see image 38 series 2) show ill-defined wall thickening with perienteric edema/ inflammation and associated interloop mesenteric fluid these are probably ileal loops, but the terminal ileum appears relatively preserved. Appendix not well seen. Minimal diverticular changes noted left colon. Vascular/Lymphatic: There is abdominal aortic atherosclerosis without aneurysm. There is no gastrohepatic or hepatoduodenal ligament lymphadenopathy. No intraperitoneal or retroperitoneal lymphadenopathy. No pelvic sidewall lymphadenopathy. Reproductive: Prostate gland is enlarged. Other: Small to moderate volume free fluid noted in the pelvis with a tiny amount of fluid seen adjacent to the liver and in the right para colic gutter. Musculoskeletal: Bone windows reveal no  worrisome lytic or sclerotic osseous lesions. IMPRESSION: 1. Areas of mid to distal small bowel in the right abdomen show irregular wall thickening with perienteric edema/ inflammation. Infectious/inflammatory enteritis would be a consideration. 2.  Interval progression of hepatic metastases. 3. Interval continued decrease in size of the pancreatic head mass. 4. No substantial change in the enhancing interpolar right renal lesion, suspicious for primary renal cell carcinoma. 5. Small to moderate volume ascites, progressive in the interval. 6. Abdominal aortic atherosclerosis. Electronically Signed   By: Misty Stanley M.D.   On: 06/12/2016 23:50     Assessment/Plan Principal Problem:   Febrile neutropenia (HCC)   Sepsis due to undetermined organism (HCC)  gastroenteritis vs cholangitis?  Continue IVF hydration. Continue cefepime and vancomycin per pharmacy Follow-up blood cultures and sensitivity. Follow-up CBC and CMP in a.m. UA still pending. Continue analgesics and antiemetics as needed.  Active Problems:   Interstitial lung disease ([place]) Supplemental oxygen as needed. Nebulized bronchodilators as needed.    Hypokalemia Continue supplementation through IV fluids. Check magnesium level. Follow-up potassium level in a.m.    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      DVT prophylaxis: SCDs. Code Status: Full code. Family Communication: His wife and sister were present in the room. Disposition Plan: Made for IV antibiotic therapy for several days and further work. Consults called:  Admission status: Inpatient/telemetry.   Reubin Milan MD [facility] Pager [phone].  If 7PM-7AM, please contact night-coverage [email or web address] Password [email or web address]  06/13/2016, 2:06 AM

## 2016-06-14 ENCOUNTER — Telehealth: Payer: Self-pay | Admitting: *Deleted

## 2016-06-14 ENCOUNTER — Inpatient Hospital Stay (HOSPITAL_COMMUNITY): Payer: 59

## 2016-06-14 DIAGNOSIS — E876 Hypokalemia: Secondary | ICD-10-CM

## 2016-06-14 DIAGNOSIS — N179 Acute kidney failure, unspecified: Secondary | ICD-10-CM

## 2016-06-14 DIAGNOSIS — R197 Diarrhea, unspecified: Secondary | ICD-10-CM

## 2016-06-14 LAB — COMPREHENSIVE METABOLIC PANEL
ALT: 13 U/L — AB (ref 17–63)
ANION GAP: 4 — AB (ref 5–15)
AST: 20 U/L (ref 15–41)
Albumin: 2.3 g/dL — ABNORMAL LOW (ref 3.5–5.0)
Alkaline Phosphatase: 97 U/L (ref 38–126)
BUN: 12 mg/dL (ref 6–20)
CHLORIDE: 111 mmol/L (ref 101–111)
CO2: 22 mmol/L (ref 22–32)
CREATININE: 0.54 mg/dL — AB (ref 0.61–1.24)
Calcium: 7.8 mg/dL — ABNORMAL LOW (ref 8.9–10.3)
Glucose, Bld: 88 mg/dL (ref 65–99)
POTASSIUM: 4.3 mmol/L (ref 3.5–5.1)
SODIUM: 137 mmol/L (ref 135–145)
Total Bilirubin: 0.7 mg/dL (ref 0.3–1.2)
Total Protein: 5.3 g/dL — ABNORMAL LOW (ref 6.5–8.1)

## 2016-06-14 LAB — CBC WITH DIFFERENTIAL/PLATELET
BASOS ABS: 0 10*3/uL (ref 0.0–0.1)
BASOS PCT: 0 %
EOS ABS: 0 10*3/uL (ref 0.0–0.7)
Eosinophils Relative: 0 %
HCT: 34.7 % — ABNORMAL LOW (ref 39.0–52.0)
Hemoglobin: 11.2 g/dL — ABNORMAL LOW (ref 13.0–17.0)
LYMPHS ABS: 0.3 10*3/uL — AB (ref 0.7–4.0)
LYMPHS PCT: 5 %
MCH: 28.5 pg (ref 26.0–34.0)
MCHC: 32.3 g/dL (ref 30.0–36.0)
MCV: 88.3 fL (ref 78.0–100.0)
MONO ABS: 0.3 10*3/uL (ref 0.1–1.0)
Monocytes Relative: 6 %
NEUTROS ABS: 4.8 10*3/uL (ref 1.7–7.7)
Neutrophils Relative %: 89 %
PLATELETS: 115 10*3/uL — AB (ref 150–400)
RBC: 3.93 MIL/uL — ABNORMAL LOW (ref 4.22–5.81)
RDW: 15.9 % — AB (ref 11.5–15.5)
WBC: 5.4 10*3/uL (ref 4.0–10.5)

## 2016-06-14 LAB — URINE CULTURE: Culture: NO GROWTH

## 2016-06-14 LAB — MAGNESIUM: MAGNESIUM: 1.9 mg/dL (ref 1.7–2.4)

## 2016-06-14 NOTE — Progress Notes (Signed)
IP PROGRESS NOTE  Subjective:   Roy English completed another treatment with gemcitabine on 06/07/2016. He appeared well when we saw him on 06/07/2016. He developed acute nausea/vomiting, diarrhea, and increased abdominal pain beginning on 06/11/2016. He had a fever. He presented to the emergency room and was noted to have mild neutropenia. He was admitted for further evaluation. He reports improvement in the pain, but he continues to have diffuse abdominal pain. The pain is worse in the lower abdomen.   Objective: Vital signs in last 24 hours: Blood pressure 96/67, pulse 93, temperature 98.2 F (36.8 C), temperature source Oral, resp. rate (!) 9, height 5\' 9"  (1.753 m), weight 152 lb 11.2 oz (69.3 kg), SpO2 96 %.  Intake/Output from previous day: 11/12 0701 - 11/13 0700 In: 2377.1 [I.V.:1927.1; IV Piggyback:450] Out: 401 [Urine:400; Stool:1]  Physical Exam:  HEENT: No thrush Lungs: Decreased breath sounds at the lower chest, no respiratory distress Cardiac: Regular rate and rhythm Abdomen: Distended, diffuse tenderness, no mass Extremities: No leg edema   Portacath/PICC-without erythema  Lab Results:  Recent Labs  06/13/16 0535 06/14/16 0432  WBC 2.8* 5.4  HGB 9.6* 11.2*  HCT 29.3* 34.7*  PLT 123* 115*    BMET  Recent Labs  06/13/16 0535 06/14/16 0432  NA 134* 137  K 4.2 4.3  CL 105 111  CO2 25 22  GLUCOSE 127* 88  BUN 11 12  CREATININE 0.71 0.54*  CALCIUM 7.4* 7.8*    Studies/Results: Ct Abdomen Pelvis W Contrast  Result Date: 06/12/2016 CLINICAL DATA:  Fever with nausea and vomiting along with diarrhea today. Pancreatic cancer. EXAM: CT ABDOMEN AND PELVIS WITH CONTRAST TECHNIQUE: Multidetector CT imaging of the abdomen and pelvis was performed using the standard protocol following bolus administration of intravenous contrast. CONTRAST:  130mL ISOVUE-300 IOPAMIDOL (ISOVUE-300) INJECTION 61% COMPARISON:  04/02/2016 FINDINGS: Lower chest:  Interstitial lung  disease seen in the bases is stable. Hepatobiliary: Multiple hepatic metastases are again noted. 2.7 cm lesion in the dome of the liver today was 2.1 cm previously. 3.1 cm right hepatic lesion seen on image 13 series 2 of today's exam measured 2.3 cm previously. A 2.8 x 2.3 cm lesion in the medial dome measured on the prior study is now 2.8 x 2.9 cm. Other metastases of similar size appear progressed. Gallbladder wall shows some ill definition and mild thickening. No intrahepatic or extrahepatic biliary dilation. Pancreas: Common bile duct stent seen previously is no longer evident. Hypo attenuating lesion in the pancreatic head measures 1.8 x 1.5 cm today compared to 2.4 x 3.2 cm previously. No substantial dilatation of the main pancreatic duct. Spleen: No splenomegaly. No focal mass lesion. Adrenals/Urinary Tract: No adrenal nodule or mass. 1.4 cm exophytic enhancing lesion identified interpolar right kidney posteriorly (see image 35 series 2) compared to 1.2 cm same axis previously. Left kidney unremarkable No evidence for hydroureter. Mild bladder wall thickening is seen most notably at the dome. Stomach/Bowel: Stomach is nondistended. No gastric wall thickening. No evidence of outlet obstruction. Duodenum is normally positioned as is the ligament of Treitz. No evidence for small bowel dilatation. Small bowel loops in the right abdomen (see image 38 series 2) show ill-defined wall thickening with perienteric edema/ inflammation and associated interloop mesenteric fluid these are probably ileal loops, but the terminal ileum appears relatively preserved. Appendix not well seen. Minimal diverticular changes noted left colon. Vascular/Lymphatic: There is abdominal aortic atherosclerosis without aneurysm. There is no gastrohepatic or hepatoduodenal ligament lymphadenopathy. No intraperitoneal or  retroperitoneal lymphadenopathy. No pelvic sidewall lymphadenopathy. Reproductive: Prostate gland is enlarged. Other: Small  to moderate volume free fluid noted in the pelvis with a tiny amount of fluid seen adjacent to the liver and in the right para colic gutter. Musculoskeletal: Bone windows reveal no worrisome lytic or sclerotic osseous lesions. IMPRESSION: 1. Areas of mid to distal small bowel in the right abdomen show irregular wall thickening with perienteric edema/ inflammation. Infectious/inflammatory enteritis would be a consideration. 2.  Interval progression of hepatic metastases. 3. Interval continued decrease in size of the pancreatic head mass. 4. No substantial change in the enhancing interpolar right renal lesion, suspicious for primary renal cell carcinoma. 5. Small to moderate volume ascites, progressive in the interval. 6. Abdominal aortic atherosclerosis. Electronically Signed   By: Misty Stanley M.D.   On: 06/12/2016 23:50    Medications: I have reviewed the patient's current medications.  Assessment/Plan: 1. Pancreas cancer, clinical T3 N0, pancreas head mass, status post an EUS biopsy at [facility] on 09/17/2015 confirming a poorly differentiate a carcinoma  ultrasound staging 09/17/2015, T2 N0  CT chest at [facility] 09/29/2015 with patchy groundglass opacities throughout both lungs, nonspecific 5 mm right lung nodule  MRI liver 09/07/2015 revealed a pancreas head mass with bile duct obstruction, no focal liver lesions, no lymphadenopathy  CT abdomen/pelvis 09/21/2015-pancreas head mass with intra-and extrahepatic bile duct dilation despite placement of a stent, suspicion for SMV involvement  Xeloda/radiation started 10/15/2015  Xeloda placed on hold beginning 11/07/2015 due to progression of skin rash  Xeloda resumed at a reduced dose of 500 mg twice daily 11/10/2015  Xeloda discontinued 11/19/2015  Radiation completed 11/24/2015  Restaging CTs 12/24/2015 with enlargement of the pancreas head mass and development of liver metastases  Gemcitabine/Abraxane day 1/day 8/day 15 schedule of a 28 day  cycle, Started 01/09/2016  Cycle 2 gemcitabine/Abraxane day one/day 8/day 15 schedule of a 28 day cycle (02/06/2016, 02/13/2016, 02/20/2016)  Gemcitabine/Abraxane schedule changed to every 2 weeks due to neuropathy and fatigue  CA-19-9 improved 03/05/2016  Restaging CT 04/02/2016 with decreased size of the pancreas mass, overall stable size of liver metastases with enlargement of one lesion  Abraxane held from the chemotherapy regimen beginning 04/02/2016  CT abdomen/pelvis 06/12/2016 consistent with progression of hepatic metastases, decrease in the pancreas head mass, progressive ascites, irregular wall thickening at the small bowel in the right abdomen  2. Idiopathic interstitial fibrosis of the lungs  3. Pain secondary to #1  4. Weight loss  5. 14 mm enhancing posterior right renal mass on CT 09/21/2015 suspicious for a renal cell carcinoma.  6. Rash on trunk and extremities secondary to Xeloda. 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 Progressive 11/07/2015. Xeloda placed on hold. Resumed at reduced dose 11/10/2015. Discontinued 11/19/2015.  7. Port-A-Cath placement 01/07/2016, interventional radiology  8. Fever 02/22/2016 status post evaluation in the emergency department. Chest x-ray and cultures negative. Fever likely related to gemcitabine.He is taking Tylenol prophylactically for a few days following each treatment.  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  10.  Admission 06/12/2016 with nausea/vomiting, abdominal pain, and diarrhea  Mr. View has metastatic pancreas cancer. He is currently being treated with single agent gemcitabine, last given on 06/07/2016. He is admitted with acute onset severe abdominal pain. The pain may be related to pancreas cancer with carcinomatosis or an acute process such as enteritis. The fever he had at home may have been related to gemcitabine. I have a low  clinical  suspicion for cholangitis.  The CT reveals evidence of disease progression in the liver. I will discuss this with Mr. Nitzel on 06/15/2016. The CA 19-9 has been stable over the past month.  Recommendations: 1. Continue antibiotics, follow-up cultures 2. Narcotic analgesics as needed for pain 3. Oncology will continue following Mr. Allums while he is in the hospital. We will discuss post discharge treatment plans with Mr. Charlesworth and his family.    LOS: 1 day   Betsy Coder, MD   06/14/2016, 1:52 PM

## 2016-06-14 NOTE — Progress Notes (Signed)
[facility] PROGRESS NOTE    Progress Note  Roy English  [id number] DOB: [birth year] DOA: 06/12/2016 PCP: Golden Pop, MD     Brief Narrative:   Roy English is an 65 y.o. 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 Patient woke up on the morning of admission starting having emesis 5-6 loose stools for the past 2 days. In the ED was found to have hypotensive post hydromorphone so he was started on normal saline  Assessment/Plan:   Sepsis due to undetermined organism/Febrile neutropenia ([place]): I agree with IV cefepime, Sepsis syndrome is improving. His blood pressure has remained stable. Blood cultures and urine cultures are pending. More concerned about gastroenteritis CT scan of the abdomen and pelvis done on 06/12/2016 showed right abdomen show irregular wall thickening with perienteric edema/ inflammation. C. Dif PCR negative continues to have watery ongoing diarrhea Influenza PCR negative, DC Tamiflu. His ANC is 2.4. Check abdominal ultrasound for possible ascites, if significant ascites was asked interventional radiologist for paracentesis and send for cell count and cultures.  Watery diarrhea: Negative c. Dif PCR.  Interstitial lung disease ([place]) Continue supplemental oxygen.  Hypokalemia Resolved likely due to hypovolemia.  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   DVT prophylaxis: lovenox Family Communication:none Disposition Plan/Barrier to D/C: Unable to determined. Code Status:     Code Status Orders        Start     Ordered   06/13/16 0253  Full code  Continuous     06/13/16 0252    Code Status History    Date Active Date Inactive Code Status Order ID Comments User Context   09/21/2015  5:54  PM 09/24/2015  4:46 PM Full Code [id number]  Theodis Blaze, MD Inpatient    Advance Directive Documentation   Flowsheet Row Most Recent Value  Type of Advance Directive  Healthcare Power of Attorney  Pre-existing out of facility DNR order (yellow form or pink MOST form)  No data  "MOST" Form in Place?  No data        IV Access:    Peripheral IV   Procedures and diagnostic studies:   Ct Abdomen Pelvis W Contrast  Result Date: 06/12/2016 CLINICAL DATA:  Fever with nausea and vomiting along with diarrhea today. Pancreatic cancer. EXAM: CT ABDOMEN AND PELVIS WITH CONTRAST TECHNIQUE: Multidetector CT imaging of the abdomen and pelvis was performed using the standard protocol following bolus administration of intravenous contrast. CONTRAST:  171mL ISOVUE-300 IOPAMIDOL (ISOVUE-300) INJECTION 61% COMPARISON:  04/02/2016 FINDINGS: Lower chest:  Interstitial lung disease seen in the bases is stable. Hepatobiliary: Multiple hepatic metastases are again noted. 2.7 cm lesion in the dome of the liver today was 2.1 cm previously. 3.1 cm right hepatic lesion seen on image 13 series 2 of today's exam measured 2.3 cm previously. A 2.8 x 2.3 cm lesion in the medial dome measured on the prior study is now 2.8 x 2.9 cm. Other metastases of similar size appear progressed. Gallbladder wall shows some ill definition and mild thickening. No intrahepatic or extrahepatic biliary dilation. Pancreas: Common bile duct stent seen previously is no longer evident. Hypo attenuating lesion in the pancreatic head measures 1.8 x 1.5 cm today compared to 2.4 x 3.2 cm previously. No substantial dilatation of the main  pancreatic duct. Spleen: No splenomegaly. No focal mass lesion. Adrenals/Urinary Tract: No adrenal nodule or mass. 1.4 cm exophytic enhancing lesion identified interpolar right kidney posteriorly (see image 35 series 2) compared to 1.2 cm same axis previously. Left kidney unremarkable No evidence for hydroureter. Mild  bladder wall thickening is seen most notably at the dome. Stomach/Bowel: Stomach is nondistended. No gastric wall thickening. No evidence of outlet obstruction. Duodenum is normally positioned as is the ligament of Treitz. No evidence for small bowel dilatation. Small bowel loops in the right abdomen (see image 38 series 2) show ill-defined wall thickening with perienteric edema/ inflammation and associated interloop mesenteric fluid these are probably ileal loops, but the terminal ileum appears relatively preserved. Appendix not well seen. Minimal diverticular changes noted left colon. Vascular/Lymphatic: There is abdominal aortic atherosclerosis without aneurysm. There is no gastrohepatic or hepatoduodenal ligament lymphadenopathy. No intraperitoneal or retroperitoneal lymphadenopathy. No pelvic sidewall lymphadenopathy. Reproductive: Prostate gland is enlarged. Other: Small to moderate volume free fluid noted in the pelvis with a tiny amount of fluid seen adjacent to the liver and in the right para colic gutter. Musculoskeletal: Bone windows reveal no worrisome lytic or sclerotic osseous lesions. IMPRESSION: 1. Areas of mid to distal small bowel in the right abdomen show irregular wall thickening with perienteric edema/ inflammation. Infectious/inflammatory enteritis would be a consideration. 2.  Interval progression of hepatic metastases. 3. Interval continued decrease in size of the pancreatic head mass. 4. No substantial change in the enhancing interpolar right renal lesion, suspicious for primary renal cell carcinoma. 5. Small to moderate volume ascites, progressive in the interval. 6. Abdominal aortic atherosclerosis. Electronically Signed   By: Misty Stanley M.D.   On: 06/12/2016 23:50     Medical Consultants:    None.  Anti-Infectives:   Cefepime 11.11.2017  Subjective:    Roy English he continues to have severe abdominal pain has trouble understanding how to use the PCA pump.  Objective:     Vitals:   06/14/16 0120 06/14/16 0427 06/14/16 0630 06/14/16 0753  BP:   107/71   Pulse:   86   Resp: 10 (!) 9 16 13   Temp:   97.7 F (36.5 C)   TempSrc:   Oral   SpO2: 96% 94% 97% 96%  Weight:      Height:        Intake/Output Summary (Last 24 hours) at 06/14/16 1035 Last data filed at 06/14/16 1021  Gross per 24 hour  Intake          2377.08 ml  Output             1251 ml  Net          1126.08 ml   Filed Weights   06/12/16 2150 06/13/16 0239  Weight: 63.5 kg (140 lb) 69.3 kg (152 lb 11.2 oz)    Exam: General exam: In no acute distress. Respiratory system: Good air movement and clear to auscultation. Cardiovascular system: S1 & S2 heard, RRR. No JVD, murmurs, rubs, gallops or clicks.  Gastrointestinal system: Significant diffuse abdominal pain with guarding no rebound tenderness. Extremities: No pedal edema. Skin: No rashes, lesions or ulcers Psychiatry: Judgement and insight appear normal. Mood & affect appropriate.    Data Reviewed:    Labs: Basic Metabolic Panel:  Recent Labs Lab 06/07/16 1132  06/12/16 2239 06/13/16 0218 06/13/16 0535 06/14/16 0432  NA 140  --  137  --  134* 137  K 3.9  < > 3.4*  --  4.2 4.3  CL  --   --  103  --  105 111  CO2 29  --  26  --  25 22  GLUCOSE 82  --  129*  --  127* 88  BUN 7.7  --  13  --  11 12  CREATININE 0.7  --  0.94  --  0.71 0.54*  CALCIUM 8.5  --  8.4*  --  7.4* 7.8*  MG  --   --   --  1.6*  --  1.9  < > = values in this interval not displayed. GFR Estimated Creatinine Clearance: 91.4 mL/min (by C-G formula based on SCr of 0.54 mg/dL (L)). Liver Function Tests:  Recent Labs Lab 06/07/16 1132 06/12/16 2239 06/13/16 0535 06/14/16 0432  AST 21 40 28 20  ALT 9 18 13* 13*  ALKPHOS 215* 189* 128* 97  BILITOT <0.22 0.7 0.4 0.7  PROT 6.5 6.6 5.2* 5.3*  ALBUMIN 2.8* 3.3* 2.4* 2.3*    Recent Labs Lab 06/12/16 2239  LIPASE 12   No results for input(s): AMMONIA in the last 168 hours. Coagulation  profile No results for input(s): INR, PROTIME in the last 168 hours.  CBC:  Recent Labs Lab 06/07/16 1132 06/12/16 2239 06/13/16 0535 06/14/16 0432  WBC 3.6* 1.9* 2.8* 5.4  NEUTROABS 2.4 1.5* 2.4 4.8  HGB 11.5* 12.4* 9.6* 11.2*  HCT 35.8* 37.7* 29.3* 34.7*  MCV 88.8 87.7 87.5 88.3  PLT 224 167 123* 115*   Cardiac Enzymes: No results for input(s): CKTOTAL, CKMB, CKMBINDEX, TROPONINI in the last 168 hours. BNP (last 3 results) No results for input(s): PROBNP in the last 8760 hours. CBG: No results for input(s): GLUCAP in the last 168 hours. D-Dimer: No results for input(s): DDIMER in the last 72 hours. Hgb A1c: No results for input(s): HGBA1C in the last 72 hours. Lipid Profile: No results for input(s): CHOL, HDL, LDLCALC, TRIG, CHOLHDL, LDLDIRECT in the last 72 hours. Thyroid function studies: No results for input(s): TSH, T4TOTAL, T3FREE, THYROIDAB in the last 72 hours.  Invalid input(s): FREET3 Anemia work up: No results for input(s): VITAMINB12, FOLATE, FERRITIN, TIBC, IRON, RETICCTPCT in the last 72 hours. Sepsis Labs:  Recent Labs Lab 06/07/16 1132 06/12/16 2239 06/13/16 0535 06/13/16 0545 06/13/16 2003 06/14/16 0432  WBC 3.6* 1.9* 2.8*  --   --  5.4  LATICACIDVEN  --   --   --  2.6* 1.5  --    Microbiology Recent Results (from the past 240 hour(s))  Blood culture (routine x 2)     Status: None (Preliminary result)   Collection Time: 06/12/16 10:40 PM  Result Value Ref Range Status   Specimen Description BLOOD LEFT ARM  Final   Special Requests BOTTLES DRAWN AEROBIC AND ANAEROBIC 6ML  Final   Culture   Final    NO GROWTH < 24 HOURS Performed at [facility]    Report Status PENDING  Incomplete  Blood culture (routine x 2)     Status: None (Preliminary result)   Collection Time: 06/12/16 10:41 PM  Result Value Ref Range Status   Specimen Description BLOOD LEFT ANTECUBITAL  Final   Special Requests BOTTLES DRAWN AEROBIC AND ANAEROBIC 6ML   Final   Culture   Final    NO GROWTH < 24 HOURS Performed at [facility]    Report Status PENDING  Incomplete  MRSA PCR Screening     Status: None   Collection Time: 06/13/16  4:00  AM  Result Value Ref Range Status   MRSA by PCR NEGATIVE NEGATIVE Final    Comment:        The GeneXpert MRSA Assay (FDA approved for NASAL specimens only), is one component of a comprehensive MRSA colonization surveillance program. It is not intended to diagnose MRSA infection nor to guide or monitor treatment for MRSA infections.   C difficile quick scan w PCR reflex     Status: None   Collection Time: 06/13/16 12:49 PM  Result Value Ref Range Status   C Diff antigen NEGATIVE NEGATIVE Final   C Diff toxin NEGATIVE NEGATIVE Final   C Diff interpretation No C. difficile detected.  Final     Medications:   . ceFEPime (MAXIPIME) IV  2 g Intravenous Q8H  . famotidine (PEPCID) IV  20 mg Intravenous Q12H  . HYDROmorphone   Intravenous Q4H  . mouth rinse  15 mL Mouth Rinse BID  . sodium chloride flush  3 mL Intravenous Q12H   Continuous Infusions: . sodium chloride 125 mL/hr at 06/14/16 0945    Time spent:25 min   LOS: 1 day   Charlynne Cousins  [facility] Pager [phone]  *Please refer to [email or web address], password [email or web address] to get updated schedule on who will round on this patient, as hospitalists switch teams weekly. If 7PM-7AM, please contact night-coverage at [email or web address], password [email or web address] for any overnight needs.  06/14/2016, 10:35 AM

## 2016-06-14 NOTE — Progress Notes (Signed)
Patient ID: Roy English, male   DOB: 1951/07/25, 65 y.o.   MRN: [id number] Patient presented to ultrasound department today for paracentesis. On limited ultrasound of abdomen in all 4 quadrants there is only a trace amount of ascites present, not safely amenable for aspiration at this time. Procedure was canceled. Patient notified.

## 2016-06-14 NOTE — Progress Notes (Signed)
Patient bladder scanned after complaints of not being able to void.  Patient had >939mL in bladder.  MD notified and will I&O cath patient.

## 2016-06-14 NOTE — Progress Notes (Signed)
Patient and family re-educated on use of the PCA.  Patient encouraged to use the PCA more frequently to help with pain management.  Sister witnessed pressing the button for patient and was educated that only the patient should be pressing the button for his pain medication.  It was ok if family encouraged him to press it once the button was green but that he should be the only one doing it.  Both patient and family verbalized understanding.  Will continue to monitor.

## 2016-06-14 NOTE — Telephone Encounter (Addendum)
Received call from [facility] from [place]  requesting a refill on pts. ZOFRAN 8mg .

## 2016-06-15 ENCOUNTER — Encounter (HOSPITAL_COMMUNITY): Payer: Self-pay | Admitting: Radiology

## 2016-06-15 ENCOUNTER — Inpatient Hospital Stay (HOSPITAL_COMMUNITY): Payer: 59

## 2016-06-15 LAB — CBC WITH DIFFERENTIAL/PLATELET
BASOS ABS: 0 10*3/uL (ref 0.0–0.1)
Basophils Relative: 0 %
Eosinophils Absolute: 0.1 10*3/uL (ref 0.0–0.7)
Eosinophils Relative: 1 %
HEMATOCRIT: 31.7 % — AB (ref 39.0–52.0)
Hemoglobin: 10.1 g/dL — ABNORMAL LOW (ref 13.0–17.0)
LYMPHS ABS: 0.3 10*3/uL — AB (ref 0.7–4.0)
LYMPHS PCT: 5 %
MCH: 28 pg (ref 26.0–34.0)
MCHC: 31.9 g/dL (ref 30.0–36.0)
MCV: 87.8 fL (ref 78.0–100.0)
MONO ABS: 0.5 10*3/uL (ref 0.1–1.0)
Monocytes Relative: 7 %
NEUTROS ABS: 6 10*3/uL (ref 1.7–7.7)
Neutrophils Relative %: 87 %
Platelets: 104 10*3/uL — ABNORMAL LOW (ref 150–400)
RBC: 3.61 MIL/uL — AB (ref 4.22–5.81)
RDW: 15.8 % — AB (ref 11.5–15.5)
WBC: 6.9 10*3/uL (ref 4.0–10.5)

## 2016-06-15 LAB — COMPREHENSIVE METABOLIC PANEL
ALT: 11 U/L — AB (ref 17–63)
AST: 16 U/L (ref 15–41)
Albumin: 2.1 g/dL — ABNORMAL LOW (ref 3.5–5.0)
Alkaline Phosphatase: 75 U/L (ref 38–126)
Anion gap: 3 — ABNORMAL LOW (ref 5–15)
BILIRUBIN TOTAL: 0.6 mg/dL (ref 0.3–1.2)
BUN: 13 mg/dL (ref 6–20)
CO2: 24 mmol/L (ref 22–32)
CREATININE: 0.48 mg/dL — AB (ref 0.61–1.24)
Calcium: 7.8 mg/dL — ABNORMAL LOW (ref 8.9–10.3)
Chloride: 109 mmol/L (ref 101–111)
Glucose, Bld: 75 mg/dL (ref 65–99)
Potassium: 3.9 mmol/L (ref 3.5–5.1)
Sodium: 136 mmol/L (ref 135–145)
TOTAL PROTEIN: 5 g/dL — AB (ref 6.5–8.1)

## 2016-06-15 MED ORDER — TAMSULOSIN HCL 0.4 MG PO CAPS
0.4000 mg | ORAL_CAPSULE | Freq: Every day | ORAL | Status: DC
  Administered 2016-06-15 – 2016-06-21 (×7): 0.4 mg via ORAL
  Filled 2016-06-15 (×7): qty 1

## 2016-06-15 MED ORDER — IOPAMIDOL (ISOVUE-300) INJECTION 61%: 15.0000 mL | Freq: Once | INTRAVENOUS | Status: DC | PRN

## 2016-06-15 MED ORDER — IOPAMIDOL (ISOVUE-300) INJECTION 61%
100.0000 mL | Freq: Once | INTRAVENOUS | Status: AC | PRN
  Administered 2016-06-15: 100 mL via INTRAVENOUS

## 2016-06-15 NOTE — Progress Notes (Addendum)
[facility] PROGRESS NOTE    Progress Note  Roy English  [id number] DOB: [birth year] DOA: 06/12/2016 PCP: Golden Pop, MD     Brief Narrative:   Roy English is an 65 y.o. 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 Patient woke up on the morning of admission starting having emesis 5-6 loose stools for the past 2 days. In the ED was found to have hypotensive post hydromorphone so he was started on normal saline, Abdominal ultrasound was done that showed minimal ascites,CT scan of the abdomen and pelvis showed right-sided colitis. He was aggressively fluid hydrated through the sepsis protocol and his lactic acidosis resolved.   Assessment/Plan:   Sepsis due to undetermined organism/Febrile neutropenia ([place]): I agree with IV cefepime, Sepsis syndrome is improving. His blood pressure has remained stable. Blood cultures and urine cultures 06/12/2016 Are negative till date. Unlikely cholangitis as his LFTs are within normal. C. Dif PCR negative. Influenza PCR on 06/13/2016 was negative,  Abdominal ultrasound done 06/14/2016 show minimal ascites no acute findings. CT scan of the abdomen and pelvis done on 06/12/2016 showed right abdomen show irregular wall thickening with perienteric edema/ inflammation. Appendix was not well visualized. His abdominal pain is not improving, I will repeat CT scan of the abdomen pelvis with contrast. He continues to have point tenderness in the right lower quadrant. Liquid diet causes pain when he tries to eat, he is in famotidine twice a day.  Watery diarrhea: Negative c. Dif PCR. Now resolved.  Interstitial lung disease ([place]) Continue supplemental oxygen.  Hypokalemia Resolved likely due to hypovolemia.  Carcinoma of pancreas metastatic to  liver [facility]) Follow-up with oncology as an outpatient.   DVT prophylaxis: lovenox Family Communication:none Disposition Plan/Barrier to D/C: Unable to determined. Code Status:     Code Status Orders        Start     Ordered   06/13/16 0253  Full code  Continuous     06/13/16 0252    Code Status History    Date Active Date Inactive Code Status Order ID Comments User Context   09/21/2015  5:54 PM 09/24/2015  4:46 PM Full Code [id number]  Theodis Blaze, MD Inpatient    Advance Directive Documentation   Flowsheet Row Most Recent Value  Type of Advance Directive  Healthcare Power of Attorney  Pre-existing out of facility DNR order (yellow form or pink MOST form)  No data  "MOST" Form in Place?  No data        IV Access:    Peripheral IV   Procedures and diagnostic studies:   US Abdomen Complete  Result Date: 06/14/2016 CLINICAL DATA:  Metastatic pancreatic cancer with abdominal pain 4 days and elevated liver function tests. EXAM: ABDOMEN ULTRASOUND COMPLETE COMPARISON:  CT from 06/12/2016, abdominal ultrasound 09/03/2015 new FINDINGS: Gallbladder: Gallbladder is contracted with biliary sludge. Mild sympathetic thickening of the gallbladder wall measuring up to 5.9 mm in thickness as a result of contraction as well as small volume ascites and pericholecystic fluid. No gallstones are visualized. Common bile duct: Diameter: Normal at 4.4 mm Liver: Inhomogeneous in appearance with several sonographically apparent hypoechoic solid masslike abnormalities the largest is 4.5 x 4 x 3.8 cm in the right hepatic dome. IVC: No abnormality visualized. Pancreas: Hypoechoic solid mass involving the pancreatic head measuring 2.8  x 2.5 x 2.4 cm. Pancreatic duct is top-normal at 3.4 mm. Spleen: Size and appearance within normal limits. Right Kidney: Length: 12.1 cm. Echogenicity within normal limits. No mass or hydronephrosis visualized. Left Kidney: Length: 13.4 cm. Echogenicity within normal limits.  No mass or hydronephrosis visualized. Abdominal aorta: The mid and distal aorta were obscured. Maximum proximal caliber of the aorta was 2.5 cm. Other findings: Small volume ascites adjacent to the liver and spleen. IMPRESSION: Inhomogeneous liver parenchyma with several hypoechoic solid masslike abnormalities consistent with metastatic disease, the largest approximately 4.5 x 4 x 3.8 cm in the right hepatic dome. These measure slightly larger than on recent CT but may be due to differences in technique and operator dependent imaging. Hypoechoic pancreatic head mass without pancreatic ductal dilatation measuring 2.8 x 2.5 x 2.4 cm. This measures slightly larger than on prior CT from 2 days ago (1.8 x 1.5 cm by CT), again likely due to operator dependent imaging differences and modality differences. Small volume ascites. Electronically Signed   By: Ashley Royalty M.D.   On: 06/14/2016 14:25   US Abdomen Limited  Result Date: 06/14/2016 CLINICAL DATA:  Abdominal distention EXAM: LIMITED ABDOMEN ULTRASOUND FOR ASCITES TECHNIQUE: Limited ultrasound survey for ascites was performed in all four abdominal quadrants. COMPARISON:  CT abdomen and pelvis June 12, 2016 FINDINGS: There is mild ascites scattered throughout the abdomen and pelvis. There are multiple loops of peristalsing bowel. IMPRESSION: Mild ascites. Electronically Signed   By: Lowella Grip III M.D.   On: 06/14/2016 14:05     Medical Consultants:    None.  Anti-Infectives:   Cefepime 11.11.2017  Subjective:    Roy English he relates his current dose of narcotics is helping with his pain but he continues to have abdominal pain has nausea and vomiting with eating.  Objective:    Vitals:   06/15/16 0152 06/15/16 0357 06/15/16 0545 06/15/16 0800  BP: 102/72  (!) 89/60   Pulse: 95  86   Resp: (!) 22 (!) 30 16 18   Temp: 98.2 F (36.8 C)  99 F (37.2 C)   TempSrc: Oral  Oral   SpO2: 96% 96% 96% 98%  Weight:      Height:          Intake/Output Summary (Last 24 hours) at 06/15/16 1007 Last data filed at 06/15/16 0600  Gross per 24 hour  Intake             3800 ml  Output             1650 ml  Net             2150 ml   Filed Weights   06/12/16 2150 06/13/16 0239  Weight: 63.5 kg (140 lb) 69.3 kg (152 lb 11.2 oz)    Exam: General exam: In no acute distress. Respiratory system: Good air movement and clear to auscultation. Cardiovascular system: S1 & S2 heard, RRR. No JVD, murmurs, rubs, gallops or clicks.  Gastrointestinal system: Right lower quadrant abdominal pain with guarding no rebound tenderness. Extremities: No pedal edema. Skin: No rashes, lesions or ulcers Psychiatry: Judgement and insight appear normal. Mood & affect appropriate.    Data Reviewed:    Labs: Basic Metabolic Panel:  Recent Labs Lab 06/12/16 2239 06/13/16 0218 06/13/16 0535 06/14/16 0432 06/15/16 0337  NA 137  --  134* 137 136  K 3.4*  --  4.2 4.3 3.9  CL 103  --  105 111 109  CO2 26  --  25 22 24   GLUCOSE 129*  --  127* 88 75  BUN 13  --  11 12 13   CREATININE 0.94  --  0.71 0.54* 0.48*  CALCIUM 8.4*  --  7.4* 7.8* 7.8*  MG  --  1.6*  --  1.9  --    GFR Estimated Creatinine Clearance: 91.4 mL/min (by C-G formula based on SCr of 0.48 mg/dL (L)). Liver Function Tests:  Recent Labs Lab 06/12/16 2239 06/13/16 0535 06/14/16 0432 06/15/16 0337  AST 40 28 20 16   ALT 18 13* 13* 11*  ALKPHOS 189* 128* 97 75  BILITOT 0.7 0.4 0.7 0.6  PROT 6.6 5.2* 5.3* 5.0*  ALBUMIN 3.3* 2.4* 2.3* 2.1*    Recent Labs Lab 06/12/16 2239  LIPASE 12   No results for input(s): AMMONIA in the last 168 hours. Coagulation profile No results for input(s): INR, PROTIME in the last 168 hours.  CBC:  Recent Labs Lab 06/12/16 2239 06/13/16 0535 06/14/16 0432 06/15/16 0337  WBC 1.9* 2.8* 5.4 6.9  NEUTROABS 1.5* 2.4 4.8 6.0  HGB 12.4* 9.6* 11.2* 10.1*  HCT 37.7* 29.3* 34.7* 31.7*  MCV 87.7 87.5 88.3 87.8  PLT 167 123* 115*  104*   Cardiac Enzymes: No results for input(s): CKTOTAL, CKMB, CKMBINDEX, TROPONINI in the last 168 hours. BNP (last 3 results) No results for input(s): PROBNP in the last 8760 hours. CBG: No results for input(s): GLUCAP in the last 168 hours. D-Dimer: No results for input(s): DDIMER in the last 72 hours. Hgb A1c: No results for input(s): HGBA1C in the last 72 hours. Lipid Profile: No results for input(s): CHOL, HDL, LDLCALC, TRIG, CHOLHDL, LDLDIRECT in the last 72 hours. Thyroid function studies: No results for input(s): TSH, T4TOTAL, T3FREE, THYROIDAB in the last 72 hours.  Invalid input(s): FREET3 Anemia work up: No results for input(s): VITAMINB12, FOLATE, FERRITIN, TIBC, IRON, RETICCTPCT in the last 72 hours. Sepsis Labs:  Recent Labs Lab 06/12/16 2239 06/13/16 0535 06/13/16 0545 06/13/16 2003 06/14/16 0432 06/15/16 0337  WBC 1.9* 2.8*  --   --  5.4 6.9  LATICACIDVEN  --   --  2.6* 1.5  --   --    Microbiology Recent Results (from the past 240 hour(s))  Blood culture (routine x 2)     Status: None (Preliminary result)   Collection Time: 06/12/16 10:40 PM  Result Value Ref Range Status   Specimen Description BLOOD LEFT ARM  Final   Special Requests BOTTLES DRAWN AEROBIC AND ANAEROBIC 6ML  Final   Culture   Final    NO GROWTH 2 DAYS Performed at [facility]    Report Status PENDING  Incomplete  Blood culture (routine x 2)     Status: None (Preliminary result)   Collection Time: 06/12/16 10:41 PM  Result Value Ref Range Status   Specimen Description BLOOD LEFT ANTECUBITAL  Final   Special Requests BOTTLES DRAWN AEROBIC AND ANAEROBIC 6ML  Final   Culture   Final    NO GROWTH 2 DAYS Performed at [facility]    Report Status PENDING  Incomplete  MRSA PCR Screening     Status: None   Collection Time: 06/13/16  4:00 AM  Result Value Ref Range Status   MRSA by PCR NEGATIVE NEGATIVE Final    Comment:        The GeneXpert MRSA Assay  (FDA approved for NASAL specimens only), is one component of a comprehensive MRSA colonization surveillance  program. It is not intended to diagnose MRSA infection nor to guide or monitor treatment for MRSA infections.   Urine culture     Status: None   Collection Time: 06/13/16 12:49 PM  Result Value Ref Range Status   Specimen Description URINE, CLEAN CATCH  Final   Special Requests NONE  Final   Culture NO GROWTH Performed at [facility]   Final   Report Status 06/14/2016 FINAL  Final  C difficile quick scan w PCR reflex     Status: None   Collection Time: 06/13/16 12:49 PM  Result Value Ref Range Status   C Diff antigen NEGATIVE NEGATIVE Final   C Diff toxin NEGATIVE NEGATIVE Final   C Diff interpretation No C. difficile detected.  Final     Medications:   . ceFEPime (MAXIPIME) IV  2 g Intravenous Q8H  . famotidine (PEPCID) IV  20 mg Intravenous Q12H  . HYDROmorphone   Intravenous Q4H  . mouth rinse  15 mL Mouth Rinse BID  . sodium chloride flush  3 mL Intravenous Q12H  . tamsulosin  0.4 mg Oral QPC breakfast   Continuous Infusions: . sodium chloride 125 mL/hr at 06/15/16 0539    Time spent:25 min   LOS: 2 days   Charlynne Cousins  [facility] Pager [phone]  *Please refer to [email or web address], password [email or web address] to get updated schedule on who will round on this patient, as hospitalists switch teams weekly. If 7PM-7AM, please contact night-coverage at [email or web address], password [email or web address] for any overnight needs.  06/15/2016, 10:07 AM

## 2016-06-15 NOTE — Progress Notes (Signed)
Patient abd distend, firm/painful to touch, and hypoactive, temp 100.1. Dr. Venetia Constable notified, positive for SBO, order given to keep patient NPO and enema ordered and given. Tylenol given as well, will continue to assess patient.

## 2016-06-15 NOTE — Progress Notes (Signed)
IP PROGRESS NOTE  Subjective:   Roy English reports improvement in the abdominal pain though he continues to have pain in the lower abdomen.  Objective: Vital signs in last 24 hours: Blood pressure (!) 97/54, pulse 94, temperature 99 F (37.2 C), temperature source Oral, resp. rate (!) 22, height 5\' 9"  (1.753 m), weight 152 lb 11.2 oz (69.3 kg), SpO2 97 %.  Intake/Output from previous day: 11/13 0701 - 11/14 0700 In: 3800 [I.V.:3500; IV Piggyback:300] Out: [id number] [Urine:1650]  Physical Exam:   Abdomen: Distended, tender in the right lower abdomen, no mass    Portacath/PICC-without erythema  Lab Results:  Recent Labs  06/14/16 0432 06/15/16 0337  WBC 5.4 6.9  HGB 11.2* 10.1*  HCT 34.7* 31.7*  PLT 115* 104*    BMET  Recent Labs  06/14/16 0432 06/15/16 0337  NA 137 136  K 4.3 3.9  CL 111 109  CO2 22 24  GLUCOSE 88 75  BUN 12 13  CREATININE 0.54* 0.48*  CALCIUM 7.8* 7.8*    Studies/Results: US Abdomen Complete  Result Date: 06/14/2016 CLINICAL DATA:  Metastatic pancreatic cancer with abdominal pain 4 days and elevated liver function tests. EXAM: ABDOMEN ULTRASOUND COMPLETE COMPARISON:  CT from 06/12/2016, abdominal ultrasound 09/03/2015 new FINDINGS: Gallbladder: Gallbladder is contracted with biliary sludge. Mild sympathetic thickening of the gallbladder wall measuring up to 5.9 mm in thickness as a result of contraction as well as small volume ascites and pericholecystic fluid. No gallstones are visualized. Common bile duct: Diameter: Normal at 4.4 mm Liver: Inhomogeneous in appearance with several sonographically apparent hypoechoic solid masslike abnormalities the largest is 4.5 x 4 x 3.8 cm in the right hepatic dome. IVC: No abnormality visualized. Pancreas: Hypoechoic solid mass involving the pancreatic head measuring 2.8 x 2.5 x 2.4 cm. Pancreatic duct is top-normal at 3.4 mm. Spleen: Size and appearance within normal limits. Right Kidney: Length: 12.1 cm.  Echogenicity within normal limits. No mass or hydronephrosis visualized. Left Kidney: Length: 13.4 cm. Echogenicity within normal limits. No mass or hydronephrosis visualized. Abdominal aorta: The mid and distal aorta were obscured. Maximum proximal caliber of the aorta was 2.5 cm. Other findings: Small volume ascites adjacent to the liver and spleen. IMPRESSION: Inhomogeneous liver parenchyma with several hypoechoic solid masslike abnormalities consistent with metastatic disease, the largest approximately 4.5 x 4 x 3.8 cm in the right hepatic dome. These measure slightly larger than on recent CT but may be due to differences in technique and operator dependent imaging. Hypoechoic pancreatic head mass without pancreatic ductal dilatation measuring 2.8 x 2.5 x 2.4 cm. This measures slightly larger than on prior CT from 2 days ago (1.8 x 1.5 cm by CT), again likely due to operator dependent imaging differences and modality differences. Small volume ascites. Electronically Signed   By: Ashley Royalty M.D.   On: 06/14/2016 14:25   Ct Abdomen Pelvis W Contrast  Result Date: 06/15/2016 CLINICAL DATA:  Abdominal pain, cough and dyspnea; history of actinic keratoses, idiopathic interstitial fibrosis, pancreatic carcinoma, common bile duct with stent placement 09/23/2015. EXAM: CT ABDOMEN AND PELVIS WITH CONTRAST TECHNIQUE: Multidetector CT imaging of the abdomen and pelvis was performed using the standard protocol following bolus administration of intravenous contrast. CONTRAST:  137mL ISOVUE-300 IOPAMIDOL (ISOVUE-300) INJECTION 61% COMPARISON:  06/12/2016 CT. FINDINGS: Lower chest: Small bilateral pleural effusions have developed in the interim with adjacent compressive atelectasis. Mosaic ground-glass opacities in the right middle lobe and lingula. Coronary arteriosclerosis noted of the borderline enlarged heart. No pericardial  effusion. Catheter tip is seen in the distal SVC. Hepatobiliary: No significant change with  reference to the multiple hepatic metastases compared to recent study 3 days ago, the largest in the right hepatic lobe measuring approximately 3.2 cm. No biliary dilatation. Sympathetic thickening of the gallbladder without stones due to adjacent ascites Pancreas: Stable 1.6 cm mass in the head of the pancreas without pancreatic ductal dilatation. Spleen: Normal in size without focal abnormality. Adrenals/Urinary Tract: Normal bilateral adrenal glands. 1.3 cm enhancing mass off the interpolar aspect of the right kidney is again seen without significant interval change from recent comparison but concerning for renal cell carcinoma given its enhancement. No obstructive uropathy. Left kidney is unremarkable. Bladder contains a Foley catheter and is slightly decompressed as a result. Tiny focus of air within the bladder consistent with instrumentation. Stomach/Bowel: The stomach is not distended. There is no gastric wall thickening. No gastric outlet obstruction. Normal bowel rotation seen. Segmental areas of small bowel thickening are noted in the left hemi abdomen involving unopacified jejunum as well some right hemi abdomen involving opacified (with oral contrast) ileal loops. Thickening of the ileal loops are believed to account for more proximal small bowel dilatation up to 3 cm. Contrast however reaches the cecum without obstruction. Findings likely represent mild to moderate enteritis with mild partial small bowel obstruction. Vascular/Lymphatic: Abdominal aortic atherosclerosis without aneurysm. No lymphadenopathy. Reproductive: Prostate is enlarged. Other: Moderate volume of ascites, increased since prior exam adjacent to the liver and predominantly within the pelvis. Musculoskeletal: No worrisome lytic or blastic disease. No acute osseus abnormality. IMPRESSION: New since recent comparison exam are bilateral small pleural effusions with compressive atelectasis and slight ground-glass mosaic appearance of the  right middle lobe and lingula, interval increase in moderate volume ascites, and segmental thickening of small bowel loops involving un-opacified jejunum and opacified ileal loops, the thickening involving the ileum appears be contributing to a mild partial SBO. Unchanged hepatic metastasis, pancreatic head neoplasm and right interpolar enhancing renal mass. Electronically Signed   By: Ashley Royalty M.D.   On: 06/15/2016 15:24   US Abdomen Limited  Result Date: 06/14/2016 CLINICAL DATA:  Abdominal distention EXAM: LIMITED ABDOMEN ULTRASOUND FOR ASCITES TECHNIQUE: Limited ultrasound survey for ascites was performed in all four abdominal quadrants. COMPARISON:  CT abdomen and pelvis June 12, 2016 FINDINGS: There is mild ascites scattered throughout the abdomen and pelvis. There are multiple loops of peristalsing bowel. IMPRESSION: Mild ascites. Electronically Signed   By: Lowella Grip III M.D.   On: 06/14/2016 14:05    Medications: I have reviewed the patient's current medications.  Assessment/Plan: 1. Pancreas cancer, clinical T3 N0, pancreas head mass, status post an EUS biopsy at [facility] on 09/17/2015 confirming a poorly differentiate a carcinoma  ultrasound staging 09/17/2015, T2 N0  CT chest at [facility] 09/29/2015 with patchy groundglass opacities throughout both lungs, nonspecific 5 mm right lung nodule  MRI liver 09/07/2015 revealed a pancreas head mass with bile duct obstruction, no focal liver lesions, no lymphadenopathy  CT abdomen/pelvis 09/21/2015-pancreas head mass with intra-and extrahepatic bile duct dilation despite placement of a stent, suspicion for SMV involvement  Xeloda/radiation started 10/15/2015  Xeloda placed on hold beginning 11/07/2015 due to progression of skin rash  Xeloda resumed at a reduced dose of 500 mg twice daily 11/10/2015  Xeloda discontinued 11/19/2015  Radiation completed 11/24/2015  Restaging CTs 12/24/2015 with enlargement of the pancreas head  mass and development of liver metastases  Gemcitabine/Abraxane day 1/day 8/day 15 schedule of a  28 day cycle, Started 01/09/2016  Cycle 2 gemcitabine/Abraxane day one/day 8/day 15 schedule of a 28 day cycle (02/06/2016, 02/13/2016, 02/20/2016)  Gemcitabine/Abraxane schedule changed to every 2 weeks due to neuropathy and fatigue  CA-19-9 improved 03/05/2016  Restaging CT 04/02/2016 with decreased size of the pancreas mass, overall stable size of liver metastases with enlargement of one lesion  Abraxane held from the chemotherapy regimen beginning 04/02/2016  CT abdomen/pelvis 06/12/2016 consistent with progression of hepatic metastases, decrease in the pancreas head mass, progressive ascites, irregular wall thickening at the small bowel in the right abdomen  2. Idiopathic interstitial fibrosis of the lungs  3. Pain secondary to #1  4. Weight loss  5. 14 mm enhancing posterior right renal mass on CT 09/21/2015 suspicious for a renal cell carcinoma.  6. Rash on trunk and extremities secondary to Xeloda. 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 Progressive 11/07/2015. Xeloda placed on hold. Resumed at reduced dose 11/10/2015. Discontinued 11/19/2015.  7. Port-A-Cath placement 01/07/2016, interventional radiology  8. Fever 02/22/2016 status post evaluation in the emergency department. Chest x-ray and cultures negative. Fever likely related to gemcitabine.He is taking Tylenol prophylactically for a few days following each treatment.  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  10.  Admission 06/12/2016 with nausea/vomiting, abdominal pain, and diarrhea  Roy English has persistent pain. I discussed the CT findings with him. There is evidence of disease progression in the liver. We will decide on continuing gemcitabine plus/minus Abraxane versus supportive care when I discussed the situation with Roy English and his family  as an outpatient. His current presentation is most likely unrelated to progression of cancer. He has pain and tenderness in the right lower abdomen that appear related to an inflammatory process in the bowel.    Recommendations: 1. Continue antibiotics 2. Convert to outpatient oral narcotic regimen as tolerated 3. Consider surgical consultation if the pain does not improve 4. Follow-up as scheduled at the Cancer center 06/21/2016.    LOS: 2 days   Betsy Coder, MD   06/15/2016, 3:44 PM

## 2016-06-16 ENCOUNTER — Encounter (HOSPITAL_COMMUNITY): Payer: Self-pay | Admitting: Family Medicine

## 2016-06-16 ENCOUNTER — Other Ambulatory Visit: Payer: Self-pay

## 2016-06-16 DIAGNOSIS — R5081 Fever presenting with conditions classified elsewhere: Secondary | ICD-10-CM

## 2016-06-16 DIAGNOSIS — D709 Neutropenia, unspecified: Secondary | ICD-10-CM

## 2016-06-16 MED ORDER — PANTOPRAZOLE SODIUM 40 MG IV SOLR
40.0000 mg | INTRAVENOUS | Status: DC
  Administered 2016-06-16 – 2016-06-19 (×4): 40 mg via INTRAVENOUS
  Filled 2016-06-16 (×4): qty 40

## 2016-06-16 MED ORDER — HYDROMORPHONE HCL 1 MG/ML IJ SOLN
0.5000 mg | INTRAMUSCULAR | Status: DC | PRN
  Administered 2016-06-16 – 2016-06-17 (×6): 0.5 mg via INTRAVENOUS
  Filled 2016-06-16 (×7): qty 1

## 2016-06-16 MED ORDER — BISACODYL 10 MG RE SUPP: 10.0000 mg | Freq: Every day | RECTAL | Status: DC | PRN

## 2016-06-16 MED ORDER — METRONIDAZOLE IN NACL 5-0.79 MG/ML-% IV SOLN
500.0000 mg | Freq: Three times a day (TID) | INTRAVENOUS | Status: AC
Start: 2016-06-16 — End: 2016-06-20
  Administered 2016-06-16 – 2016-06-20 (×14): 500 mg via INTRAVENOUS
  Filled 2016-06-16 (×14): qty 100

## 2016-06-16 NOTE — Evaluation (Signed)
Physical Therapy Evaluation Patient Details Name: Roy English MRN: [id number] DOB: May 24, 1951 Today's Date: 06/16/2016   History of Present Illness  65 yo male admitted with neutropenia, SBO. hx of pancreatic cancer, OA, fibrosis of lungs, chronic abd pain  Clinical Impression  On eval, pt required Min assist for mobility. He walked ~100 feet while holding on to IV pole for support. Pt is unsteady and at risk for falls at this time. He demonstrates general weakness, decreased activity tolerance, and impaired gait and balance. Discussed d/c plan with pt and sister-plan is for pt to return home. Pt is agreeable to HHPT follow up. Will follow during hospital stay and progress activity as tolerated.     Follow Up Recommendations Home health PT;Supervision/Assistance - 24 hour    Equipment Recommendations   (continuing to assess-may need RW)    Recommendations for Other Services       Precautions / Restrictions Precautions Precautions: Fall Restrictions Weight Bearing Restrictions: No      Mobility  Bed Mobility Overal bed mobility: Needs Assistance Bed Mobility: Supine to Sit;Sit to Supine     Supine to sit: Min assist Sit to supine: Min assist   General bed mobility comments: small amount of assist required. Increased time  Transfers Overall transfer level: Needs assistance   Transfers: Sit to/from Stand;Stand Pivot Transfers Sit to Stand: Min assist Stand pivot transfers: Min assist       General transfer comment: Assist to rise, stabilize, control descent. Stand pivot, bed<>bsc.   Ambulation/Gait Ambulation/Gait assistance: Min assist Ambulation Distance (Feet): 100 Feet Assistive device:  (used IV pole for support) Gait Pattern/deviations: Step-through pattern;Decreased stride length     General Gait Details: Unsteady. Assist to stabilize. Dyspnea 2-3/4 with ambulation. Pt fatigues easily.   Stairs            Wheelchair Mobility    Modified Rankin  (Stroke Patients Only)       Balance Overall balance assessment: Needs assistance           Standing balance-Leahy Scale: Fair                               Pertinent Vitals/Pain Pain Assessment: Faces Faces Pain Scale: Hurts even more Pain Location: abdomen Pain Intervention(s): Monitored during session    Home Living Family/patient expects to be discharged to:: Private residence Living Arrangements: Spouse/significant other;Other relatives Available Help at Discharge: Family Type of Home: House         Home Equipment: None      Prior Function Level of Independence: Independent               Hand Dominance        Extremity/Trunk Assessment               Lower Extremity Assessment: RLE deficits/detail;LLE deficits/detail      Cervical / Trunk Assessment: Normal  Communication   Communication: No difficulties  Cognition Arousal/Alertness: Awake/alert Behavior During Therapy: WFL for tasks assessed/performed Overall Cognitive Status: Within Functional Limits for tasks assessed                      General Comments      Exercises     Assessment/Plan    PT Assessment Patient needs continued PT services  PT Problem List Decreased strength;Decreased mobility;Decreased activity tolerance;Decreased balance;Pain;Decreased knowledge of use of DME  PT Treatment Interventions Gait training;Therapeutic activities;Therapeutic exercise;DME instruction;Balance training;Functional mobility training;Patient/family education    PT Goals (Current goals can be found in the Care Plan section)  Acute Rehab PT Goals Patient Stated Goal: to get stronger. home.  PT Goal Formulation: With patient/family Time For Goal Achievement: 06/30/16 Potential to Achieve Goals: Good    Frequency Min 3X/week   Barriers to discharge        Co-evaluation               End of Session   Activity Tolerance: Patient limited by  fatigue Patient left: in bed;with call bell/phone within reach;with family/visitor present;with bed alarm set           Time: 1410-1450 PT Time Calculation (min) (ACUTE ONLY): 40 min   Charges:   PT Evaluation $PT Eval Low Complexity: 1 Procedure PT Treatments $Gait Training: 8-22 mins   PT G Codes:        Weston Anna, MPT Pager: [phone]

## 2016-06-16 NOTE — Progress Notes (Signed)
IP PROGRESS NOTE  Subjective:   Roy English continues to have abdominal pain. No vomiting. No bowel movement. His wife and sister are at the bedside this morning.  Objective: Vital signs in last 24 hours: Blood pressure 107/62, pulse 93, temperature 98.7 F (37.1 C), temperature source Oral, resp. rate (!) 23, height 5\' 9"  (1.753 m), weight 152 lb 11.2 oz (69.3 kg), SpO2 95 %.  Intake/Output from previous day: 11/14 0701 - 11/15 0700 In: 3680 [P.O.:480; I.V.:3000; IV Piggyback:200] Out: 1750 [Urine:1750]  Physical Exam: He is more alert today. Lungs: Decreased breath sounds at the lower chest bilaterally, no respiratory distress Cardiac: Regular rate and rhythm Abdomen: Distended, tender in the right lower abdomen, no mass    Portacath/PICC-without erythema  Lab Results:  Recent Labs  06/14/16 0432 06/15/16 0337  WBC 5.4 6.9  HGB 11.2* 10.1*  HCT 34.7* 31.7*  PLT 115* 104*    BMET  Recent Labs  06/14/16 0432 06/15/16 0337  NA 137 136  K 4.3 3.9  CL 111 109  CO2 22 24  GLUCOSE 88 75  BUN 12 13  CREATININE 0.54* 0.48*  CALCIUM 7.8* 7.8*    Studies/Results: US Abdomen Complete  Result Date: 06/14/2016 CLINICAL DATA:  Metastatic pancreatic cancer with abdominal pain 4 days and elevated liver function tests. EXAM: ABDOMEN ULTRASOUND COMPLETE COMPARISON:  CT from 06/12/2016, abdominal ultrasound 09/03/2015 new FINDINGS: Gallbladder: Gallbladder is contracted with biliary sludge. Mild sympathetic thickening of the gallbladder wall measuring up to 5.9 mm in thickness as a result of contraction as well as small volume ascites and pericholecystic fluid. No gallstones are visualized. Common bile duct: Diameter: Normal at 4.4 mm Liver: Inhomogeneous in appearance with several sonographically apparent hypoechoic solid masslike abnormalities the largest is 4.5 x 4 x 3.8 cm in the right hepatic dome. IVC: No abnormality visualized. Pancreas: Hypoechoic solid mass involving the  pancreatic head measuring 2.8 x 2.5 x 2.4 cm. Pancreatic duct is top-normal at 3.4 mm. Spleen: Size and appearance within normal limits. Right Kidney: Length: 12.1 cm. Echogenicity within normal limits. No mass or hydronephrosis visualized. Left Kidney: Length: 13.4 cm. Echogenicity within normal limits. No mass or hydronephrosis visualized. Abdominal aorta: The mid and distal aorta were obscured. Maximum proximal caliber of the aorta was 2.5 cm. Other findings: Small volume ascites adjacent to the liver and spleen. IMPRESSION: Inhomogeneous liver parenchyma with several hypoechoic solid masslike abnormalities consistent with metastatic disease, the largest approximately 4.5 x 4 x 3.8 cm in the right hepatic dome. These measure slightly larger than on recent CT but may be due to differences in technique and operator dependent imaging. Hypoechoic pancreatic head mass without pancreatic ductal dilatation measuring 2.8 x 2.5 x 2.4 cm. This measures slightly larger than on prior CT from 2 days ago (1.8 x 1.5 cm by CT), again likely due to operator dependent imaging differences and modality differences. Small volume ascites. Electronically Signed   By: Ashley Royalty M.D.   On: 06/14/2016 14:25   Ct Abdomen Pelvis W Contrast  Result Date: 06/15/2016 CLINICAL DATA:  Abdominal pain, cough and dyspnea; history of actinic keratoses, idiopathic interstitial fibrosis, pancreatic carcinoma, common bile duct with stent placement 09/23/2015. EXAM: CT ABDOMEN AND PELVIS WITH CONTRAST TECHNIQUE: Multidetector CT imaging of the abdomen and pelvis was performed using the standard protocol following bolus administration of intravenous contrast. CONTRAST:  193mL ISOVUE-300 IOPAMIDOL (ISOVUE-300) INJECTION 61% COMPARISON:  06/12/2016 CT. FINDINGS: Lower chest: Small bilateral pleural effusions have developed in the interim  with adjacent compressive atelectasis. Mosaic ground-glass opacities in the right middle lobe and lingula.  Coronary arteriosclerosis noted of the borderline enlarged heart. No pericardial effusion. Catheter tip is seen in the distal SVC. Hepatobiliary: No significant change with reference to the multiple hepatic metastases compared to recent study 3 days ago, the largest in the right hepatic lobe measuring approximately 3.2 cm. No biliary dilatation. Sympathetic thickening of the gallbladder without stones due to adjacent ascites Pancreas: Stable 1.6 cm mass in the head of the pancreas without pancreatic ductal dilatation. Spleen: Normal in size without focal abnormality. Adrenals/Urinary Tract: Normal bilateral adrenal glands. 1.3 cm enhancing mass off the interpolar aspect of the right kidney is again seen without significant interval change from recent comparison but concerning for renal cell carcinoma given its enhancement. No obstructive uropathy. Left kidney is unremarkable. Bladder contains a Foley catheter and is slightly decompressed as a result. Tiny focus of air within the bladder consistent with instrumentation. Stomach/Bowel: The stomach is not distended. There is no gastric wall thickening. No gastric outlet obstruction. Normal bowel rotation seen. Segmental areas of small bowel thickening are noted in the left hemi abdomen involving unopacified jejunum as well some right hemi abdomen involving opacified (with oral contrast) ileal loops. Thickening of the ileal loops are believed to account for more proximal small bowel dilatation up to 3 cm. Contrast however reaches the cecum without obstruction. Findings likely represent mild to moderate enteritis with mild partial small bowel obstruction. Vascular/Lymphatic: Abdominal aortic atherosclerosis without aneurysm. No lymphadenopathy. Reproductive: Prostate is enlarged. Other: Moderate volume of ascites, increased since prior exam adjacent to the liver and predominantly within the pelvis. Musculoskeletal: No worrisome lytic or blastic disease. No acute osseus  abnormality. IMPRESSION: New since recent comparison exam are bilateral small pleural effusions with compressive atelectasis and slight ground-glass mosaic appearance of the right middle lobe and lingula, interval increase in moderate volume ascites, and segmental thickening of small bowel loops involving un-opacified jejunum and opacified ileal loops, the thickening involving the ileum appears be contributing to a mild partial SBO. Unchanged hepatic metastasis, pancreatic head neoplasm and right interpolar enhancing renal mass. Electronically Signed   By: Ashley Royalty M.D.   On: 06/15/2016 15:24   US Abdomen Limited  Result Date: 06/14/2016 CLINICAL DATA:  Abdominal distention EXAM: LIMITED ABDOMEN ULTRASOUND FOR ASCITES TECHNIQUE: Limited ultrasound survey for ascites was performed in all four abdominal quadrants. COMPARISON:  CT abdomen and pelvis June 12, 2016 FINDINGS: There is mild ascites scattered throughout the abdomen and pelvis. There are multiple loops of peristalsing bowel. IMPRESSION: Mild ascites. Electronically Signed   By: Lowella Grip III M.D.   On: 06/14/2016 14:05    Medications: I have reviewed the patient's current medications.  Assessment/Plan: 1. Pancreas cancer, clinical T3 N0, pancreas head mass, status post an EUS biopsy at [facility] on 09/17/2015 confirming a poorly differentiate a carcinoma  ultrasound staging 09/17/2015, T2 N0  CT chest [facility] 09/29/2015 with patchy groundglass opacities throughout both lungs, nonspecific 5 mm right lung nodule  MRI liver 09/07/2015 revealed a pancreas head mass with bile duct obstruction, no focal liver lesions, no lymphadenopathy  CT abdomen/pelvis 09/21/2015-pancreas head mass with intra-and extrahepatic bile duct dilation despite placement of a stent, suspicion for SMV involvement  Xeloda/radiation started 10/15/2015  Xeloda placed on hold beginning 11/07/2015 due to progression of skin rash  Xeloda resumed at a reduced  dose of 500 mg twice daily 11/10/2015  Xeloda discontinued 11/19/2015  Radiation completed 11/24/2015  Restaging CTs 12/24/2015 with enlargement of the pancreas head mass and development of liver metastases  Gemcitabine/Abraxane day 1/day 8/day 15 schedule of a 28 day cycle, Started 01/09/2016  Cycle 2 gemcitabine/Abraxane day one/day 8/day 15 schedule of a 28 day cycle (02/06/2016, 02/13/2016, 02/20/2016)  Gemcitabine/Abraxane schedule changed to every 2 weeks due to neuropathy and fatigue  CA-19-9 improved 03/05/2016  Restaging CT 04/02/2016 with decreased size of the pancreas mass, overall stable size of liver metastases with enlargement of one lesion  Abraxane held from the chemotherapy regimen beginning 04/02/2016  CT abdomen/pelvis 06/12/2016 consistent with progression of hepatic metastases, decrease in the pancreas head mass, progressive ascites, irregular wall thickening at the small bowel in the right abdomen  2. Idiopathic interstitial fibrosis of the lungs  3. Pain secondary to #1  4. Weight loss  5. 14 mm enhancing posterior right renal mass on CT 09/21/2015 suspicious for a renal cell carcinoma.  6. Rash on trunk and extremities secondary to Xeloda. 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 Progressive 11/07/2015. Xeloda placed on hold. Resumed at reduced dose 11/10/2015. Discontinued 11/19/2015.  7. Port-A-Cath placement 01/07/2016, interventional radiology  8. Fever 02/22/2016 status post evaluation in the emergency department. Chest x-ray and cultures negative. Fever likely related to gemcitabine.He is taking Tylenol prophylactically for a few days following each treatment.  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  10.  Admission 06/12/2016 with nausea/vomiting, abdominal pain, and diarrhea-repeat CTs reveal areas of small bowel thickening and partial small bowel obstruction  Roy English  has persistent pain, likely secondary to the small bowel process causing a partial obstruction. I discussed the situation with Roy English and his family. They understand the ascites and small bowel thickening could be related to carcinomatosis versus benign inflammation. An ultrasound on 06/14/2016 did not reveal enough fluid for a paracentesis.    Recommendations: 1. Continue antibiotics 2. Discontinue Dilaudid PCA 3. Consider surgical consultation if the pain and small bowel obstruction do not improve 4. Follow-up as scheduled at the Cancer center 06/21/2016.    LOS: 3 days   Betsy Coder, MD   06/16/2016, 12:44 PM

## 2016-06-16 NOTE — Consult Note (Signed)
[facility] Surgery Consult Note  Roy English 17-Sep-1950  [id number].    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 Patient has chronic abdominal pain at baseline but it became worse on 06/12/16 and on 06/13/16 he woke up with nausea and emesis which brought him to the hospital. He He denies sick contacts, HA, chest pain, hematemesis, melena, hematochezia, and urinary symptoms. He does endorse generalized weakness and dyspnea. Patient states that his last episode of chemotherapy (Currently on gemcitabine) was 06/07/16 and denies any acute events surrounding chemotherapy.   ED workup: hypokalemia (3.4), neutropenia (WBC 1.9), Alk phos 189,  CT abdomen/pelvis significant for wall thickening of mid to distal small bowel in the right abdomen with perienteric edema/ inflammation (enteritis?), progression of hepatic metastasis, decrease in size of pancreatic head mass, and mild ascites.  Patient was admitted with a diagnosis of sepsis and febrile neutropenia. He is currently on IV cefepime and flagyl. Repeat CT scan performed 11/14 suggests ileal thickening is contributing to SBO. General surgery has been asked to consult regarding partial/mild small bowel obstruction. Today he reports that he is passing flatus. His pain is most severe in his RLQ and is described as "very sore". Denies nausea or vomiting since Saturday/Sunday 11/11-11/12. He tolerated a full liquid diet yesterday without nausea or vomiting, however eating caused him severe abdominal pain. He has not ambulated in about 3 days.   ROS: All systems reviewed and otherwise negative except for as above  Family History   Problem Relation Age of Onset  . Heart disease Mother   . Rheum arthritis Mother   . Cancer      both sides of the family, type unknown    Past Medical History:  Diagnosis Date  . Actinic keratosis   . Arthritis   . Carcinoma of pancreas metastatic to liver ([place]) 12/24/2015  . Dilated cbd, acquired   . ED (erectile dysfunction)   . Elevated LFTs   . Environmental allergies   . Idiopathic interstitial fibrosis of lung syndrome ([place])   . S/P radiation therapy 11/24/15 completed    pancreas 50.4Gy    Past Surgical History:  Procedure Laterality Date  . [facility] Separation Surgery Bilateral   . BILIARY STENT PLACEMENT N/A 09/23/2015   Procedure: BILIARY STENT PLACEMENT;  Surgeon: Milus Banister, MD;  Location: [facility] ENDOSCOPY;  Service: Endoscopy;  Laterality: N/A;  . carpal tunnel surgery Bilateral   . ERCP N/A 09/08/2015   Procedure: ENDOSCOPIC RETROGRADE CHOLANGIOPANCREATOGRAPHY (ERCP);  Surgeon: Gatha Mayer, MD;  Location: [facility];  Service: Endoscopy;  Laterality: N/A;  . ERCP N/A 09/23/2015   Procedure: ENDOSCOPIC RETROGRADE CHOLANGIOPANCREATOGRAPHY (ERCP);  Surgeon: Milus Banister, MD;  Location: [facility];  Service: Endoscopy;  Laterality: N/A;  with stent exchange  . KNEE ARTHROSCOPY Right   . multiple surgeries     ribs,arms,nose,fingers  . skin cancer removal      face and chest    Social History:  reports that he has never smoked. He has never used smokeless tobacco. He reports that he drinks alcohol. He reports that he does not use drugs.  Allergies:  Allergies  Allergen Reactions  . Penicillins Other (See Comments)  Reaction:  Unknown  Has patient had a PCN reaction causing immediate rash, facial/tongue/throat swelling, SOB or lightheadedness with hypotension: Unsure Has patient had a PCN reaction causing severe rash involving mucus membranes or skin necrosis: Unsure Has patient had a PCN reaction that required hospitalization Unsure Has patient had a PCN  reaction occurring within the last 10 years: No If all of the above answers are "NO", then may proceed with Cephalosporin use.    Medications Prior to Admission  Medication Sig Dispense Refill  . acetaminophen (TYLENOL) 500 MG tablet Take 1,000 mg by mouth every 6 (six) hours as needed for mild pain, moderate pain or headache.    . Bisacodyl (DULCOLAX PO) Take 1 tablet by mouth 2 (two) times daily.     Marland Kitchen HYDROmorphone (DILAUDID) 2 MG tablet Take 1-2 tablets (2-4 mg total) by mouth every 4 (four) hours as needed for severe pain. 100 tablet 0  . lactose free nutrition (BOOST) LIQD Take 237 mLs by mouth 2 (two) times daily between meals.    . lidocaine-prilocaine (EMLA) cream Apply 1 application topically as needed (prior to accessing port).    . LORazepam (ATIVAN) 0.5 MG tablet TAKE 1 TABLET BY MOUTH EVERY 4 HOURS AS NEEDED FOR ANXIETY/NAUSEA 90 tablet 0  . mirtazapine (REMERON) 15 MG tablet Take 1 tablet (15 mg total) by mouth at bedtime. 30 tablet 2  . omeprazole (PRILOSEC) 20 MG capsule Take 20 mg by mouth 2 (two) times daily.     . ondansetron (ZOFRAN) 8 MG tablet Take 1 tablet (8 mg total) by mouth every 6 (six) hours as needed for nausea or vomiting. (Patient taking differently: Take 8 mg by mouth 2 (two) times daily. ) 30 tablet 3  . Pancrelipase, Lip-Prot-Amyl, (CREON) 3000-9500 units CPEP Take 3 capsules by mouth 3 (three) times daily with meals.    . polyethylene glycol (MIRALAX / GLYCOLAX) packet Take 17 g by mouth daily.     . prochlorperazine (COMPAZINE) 10 MG tablet Take 10 mg by mouth every 6 (six) hours as needed for nausea or vomiting.      Blood pressure 107/62, pulse 93, temperature 98.7 F (37.1 C), temperature source Oral, resp. rate (!) 23, height '5\' 9"'$  (1.753 m), weight 152 lb 11.2 oz (69.3 kg), SpO2 95 %. Physical Exam: General: pleasant, thin, ill-appearing white male who is laying in bed in NAD HEENT: head is normocephalic, atraumatic. Heart: regular, rate, and  rhythm.  No obvious murmurs, gallops, or rubs noted.  Palpable pedal pulses bilaterally Lungs: CTAB, decreased breath sounds in bilateral bases Abd: soft, TTP RUQ and RLQ with guarding over RLQ, +BS, moderately distended; no peritonitis; no masses MS: all 4 extremities are symmetrical with no cyanosis, clubbing, or edema. Skin: warm and dry Psych: A&Ox3 with an appropriate affect. Neuro: extremity CSM intact bilaterally, normal speech  Results for orders placed or performed during the hospital encounter of 06/12/16 (from the past 48 hour(s))  CBC with Differential     Status: Abnormal   Collection Time: 06/15/16  3:37 AM  Result Value Ref Range   WBC 6.9 4.0 - 10.5 K/uL   RBC 3.61 (L) 4.22 - 5.81 MIL/uL   Hemoglobin 10.1 (L) 13.0 - 17.0 g/dL   HCT 31.7 (L) 39.0 - 52.0 %   MCV 87.8 78.0 - 100.0 fL   MCH 28.0 26.0 - 34.0 pg   MCHC 31.9 30.0 - 36.0 g/dL   RDW 15.8 (H) 11.5 - 15.5 %   Platelets 104 (  L) 150 - 400 K/uL    Comment: CONSISTENT WITH PREVIOUS RESULT   Neutrophils Relative % 87 %   Neutro Abs 6.0 1.7 - 7.7 K/uL   Lymphocytes Relative 5 %   Lymphs Abs 0.3 (L) 0.7 - 4.0 K/uL   Monocytes Relative 7 %   Monocytes Absolute 0.5 0.1 - 1.0 K/uL   Eosinophils Relative 1 %   Eosinophils Absolute 0.1 0.0 - 0.7 K/uL   Basophils Relative 0 %   Basophils Absolute 0.0 0.0 - 0.1 K/uL  Comprehensive metabolic panel     Status: Abnormal   Collection Time: 06/15/16  3:37 AM  Result Value Ref Range   Sodium 136 135 - 145 mmol/L   Potassium 3.9 3.5 - 5.1 mmol/L   Chloride 109 101 - 111 mmol/L   CO2 24 22 - 32 mmol/L   Glucose, Bld 75 65 - 99 mg/dL   BUN 13 6 - 20 mg/dL   Creatinine, Ser 0.48 (L) 0.61 - 1.24 mg/dL   Calcium 7.8 (L) 8.9 - 10.3 mg/dL   Total Protein 5.0 (L) 6.5 - 8.1 g/dL   Albumin 2.1 (L) 3.5 - 5.0 g/dL   AST 16 15 - 41 U/L   ALT 11 (L) 17 - 63 U/L   Alkaline Phosphatase 75 38 - 126 U/L   Total Bilirubin 0.6 0.3 - 1.2 mg/dL   GFR calc non Af Amer >60 >60 mL/min   GFR  calc Af Amer >60 >60 mL/min    Comment: (NOTE) The eGFR has been calculated using the CKD EPI equation. This calculation has not been validated in all clinical situations. eGFR's persistently <60 mL/min signify possible Chronic Kidney Disease.    Anion gap 3 (L) 5 - 15   US Abdomen Complete  Result Date: 06/14/2016 CLINICAL DATA:  Metastatic pancreatic cancer with abdominal pain 4 days and elevated liver function tests. EXAM: ABDOMEN ULTRASOUND COMPLETE COMPARISON:  CT from 06/12/2016, abdominal ultrasound 09/03/2015 new FINDINGS: Gallbladder: Gallbladder is contracted with biliary sludge. Mild sympathetic thickening of the gallbladder wall measuring up to 5.9 mm in thickness as a result of contraction as well as small volume ascites and pericholecystic fluid. No gallstones are visualized. Common bile duct: Diameter: Normal at 4.4 mm Liver: Inhomogeneous in appearance with several sonographically apparent hypoechoic solid masslike abnormalities the largest is 4.5 x 4 x 3.8 cm in the right hepatic dome. IVC: No abnormality visualized. Pancreas: Hypoechoic solid mass involving the pancreatic head measuring 2.8 x 2.5 x 2.4 cm. Pancreatic duct is top-normal at 3.4 mm. Spleen: Size and appearance within normal limits. Right Kidney: Length: 12.1 cm. Echogenicity within normal limits. No mass or hydronephrosis visualized. Left Kidney: Length: 13.4 cm. Echogenicity within normal limits. No mass or hydronephrosis visualized. Abdominal aorta: The mid and distal aorta were obscured. Maximum proximal caliber of the aorta was 2.5 cm. Other findings: Small volume ascites adjacent to the liver and spleen. IMPRESSION: Inhomogeneous liver parenchyma with several hypoechoic solid masslike abnormalities consistent with metastatic disease, the largest approximately 4.5 x 4 x 3.8 cm in the right hepatic dome. These measure slightly larger than on recent CT but may be due to differences in technique and operator dependent  imaging. Hypoechoic pancreatic head mass without pancreatic ductal dilatation measuring 2.8 x 2.5 x 2.4 cm. This measures slightly larger than on prior CT from 2 days ago (1.8 x 1.5 cm by CT), again likely due to operator dependent imaging differences and modality differences. Small volume ascites. Electronically Signed  By: Ashley Royalty M.D.   On: 06/14/2016 14:25   Ct Abdomen Pelvis W Contrast  Result Date: 06/15/2016 CLINICAL DATA:  Abdominal pain, cough and dyspnea; history of actinic keratoses, idiopathic interstitial fibrosis, pancreatic carcinoma, common bile duct with stent placement 09/23/2015. EXAM: CT ABDOMEN AND PELVIS WITH CONTRAST TECHNIQUE: Multidetector CT imaging of the abdomen and pelvis was performed using the standard protocol following bolus administration of intravenous contrast. CONTRAST:  160m ISOVUE-300 IOPAMIDOL (ISOVUE-300) INJECTION 61% COMPARISON:  06/12/2016 CT. FINDINGS: Lower chest: Small bilateral pleural effusions have developed in the interim with adjacent compressive atelectasis. Mosaic ground-glass opacities in the right middle lobe and lingula. Coronary arteriosclerosis noted of the borderline enlarged heart. No pericardial effusion. Catheter tip is seen in the distal SVC. Hepatobiliary: No significant change with reference to the multiple hepatic metastases compared to recent study 3 days ago, the largest in the right hepatic lobe measuring approximately 3.2 cm. No biliary dilatation. Sympathetic thickening of the gallbladder without stones due to adjacent ascites Pancreas: Stable 1.6 cm mass in the head of the pancreas without pancreatic ductal dilatation. Spleen: Normal in size without focal abnormality. Adrenals/Urinary Tract: Normal bilateral adrenal glands. 1.3 cm enhancing mass off the interpolar aspect of the right kidney is again seen without significant interval change from recent comparison but concerning for renal cell carcinoma given its enhancement. No  obstructive uropathy. Left kidney is unremarkable. Bladder contains a Foley catheter and is slightly decompressed as a result. Tiny focus of air within the bladder consistent with instrumentation. Stomach/Bowel: The stomach is not distended. There is no gastric wall thickening. No gastric outlet obstruction. Normal bowel rotation seen. Segmental areas of small bowel thickening are noted in the left hemi abdomen involving unopacified jejunum as well some right hemi abdomen involving opacified (with oral contrast) ileal loops. Thickening of the ileal loops are believed to account for more proximal small bowel dilatation up to 3 cm. Contrast however reaches the cecum without obstruction. Findings likely represent mild to moderate enteritis with mild partial small bowel obstruction. Vascular/Lymphatic: Abdominal aortic atherosclerosis without aneurysm. No lymphadenopathy. Reproductive: Prostate is enlarged. Other: Moderate volume of ascites, increased since prior exam adjacent to the liver and predominantly within the pelvis. Musculoskeletal: No worrisome lytic or blastic disease. No acute osseus abnormality. IMPRESSION: New since recent comparison exam are bilateral small pleural effusions with compressive atelectasis and slight ground-glass mosaic appearance of the right middle lobe and lingula, interval increase in moderate volume ascites, and segmental thickening of small bowel loops involving un-opacified jejunum and opacified ileal loops, the thickening involving the ileum appears be contributing to a mild partial SBO. Unchanged hepatic metastasis, pancreatic head neoplasm and right interpolar enhancing renal mass. Electronically Signed   By: DAshley RoyaltyM.D.   On: 06/15/2016 15:24   UKoreaAbdomen Limited  Result Date: 06/14/2016 CLINICAL DATA:  Abdominal distention EXAM: LIMITED ABDOMEN ULTRASOUND FOR ASCITES TECHNIQUE: Limited ultrasound survey for ascites was performed in all four abdominal quadrants.  COMPARISON:  CT abdomen and pelvis June 12, 2016 FINDINGS: There is mild ascites scattered throughout the abdomen and pelvis. There are multiple loops of peristalsing bowel. IMPRESSION: Mild ascites. Electronically Signed   By: WLowella GripIII M.D.   On: 06/14/2016 14:05   Assessment/Plan Partial small bowel obstruction No acute surgical needs. Agree empiric IV antibiotics. Patient nausea is improved and he is passing flatus. Recommend minimizing narcotic medications as able, continuation of a bowel regimen dulcolax/Miralax/enema as tolerated. Could consider PO gastrografin to encourage bowel  motility. PT consult to help mobilize patient.  Will discuss further with MD.   Jill Alexanders, [facility] Surgery 06/16/2016, 12:38 PM Pager: [phone] Consults: [phone] Mon-Fri 7:00 am-4:30 pm Sat-Sun 7:00 am-11:30 am

## 2016-06-16 NOTE — Progress Notes (Signed)
Pharmacy Antibiotic Note  Roy English is a 65 y.o. male admitted on 06/12/2016 with Febrile neutropenia .  WBC now WNL. Concern for intra-abdominal infection. Pharmacy has been consulted for cefepime dosing.  Day #4 cefepime  WBC WNL  Tm 100.1  Plan:  Cefepime 2gm iv q8hr  Add metronidazole for anaerobic coverage  Height: 5\' 9"  (175.3 cm) Weight: 152 lb 11.2 oz (69.3 kg) IBW/kg (Calculated) : 70.7  Temp (24hrs), Avg:99.1 F (37.3 C), Min:98.7 F (37.1 C), Max:100.1 F (37.8 C)   Recent Labs Lab 06/12/16 2239 06/13/16 0535 06/13/16 0545 06/13/16 2003 06/14/16 0432 06/15/16 0337  WBC 1.9* 2.8*  --   --  5.4 6.9  CREATININE 0.94 0.71  --   --  0.54* 0.48*  LATICACIDVEN  --   --  2.6* 1.5  --   --     Estimated Creatinine Clearance: 91.4 mL/min (by C-G formula based on SCr of 0.48 mg/dL (L)).    Allergies  Allergen Reactions  . Penicillins Other (See Comments)    Reaction:  Unknown  Has patient had a PCN reaction causing immediate rash, facial/tongue/throat swelling, SOB or lightheadedness with hypotension: Unsure Has patient had a PCN reaction causing severe rash involving mucus membranes or skin necrosis: Unsure Has patient had a PCN reaction that required hospitalization Unsure Has patient had a PCN reaction occurring within the last 10 years: No If all of the above answers are "NO", then may proceed with Cephalosporin use.    Antimicrobials this admission:  Vancomycin 06/13/2016 >> 11/13 Cefepime 06/13/2016 >>  Flagyl 11/15 >>  Dose adjustments this admission:   Microbiology results:  11/12 BCx: NGTD 11/12 UCx: NG  11/12 MRSA PCR: neg 11/12 C. Diff: neg/neg  Thank you for allowing pharmacy to be a part of this patient's care.  Doreene Eland, PharmD, BCPS.   Pager: [phone] 06/16/2016 10:41 AM

## 2016-06-16 NOTE — Progress Notes (Signed)
[facility] PROGRESS NOTE    Progress Note  Roy English  [id number] DOB: 01/18/1951 DOA: 06/12/2016 PCP: Golden Pop, MD     Brief Narrative:   MEREL KARAMAN is an 65 y.o. 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 Patient woke up on the morning of admission starting having emesis 5-6 loose stools for the past 2 days. In the ED was found to have hypotensive post hydromorphone so he was started on normal saline, Abdominal ultrasound was done that showed minimal ascites,CT scan of the abdomen and pelvis showed right-sided colitis. He was aggressively fluid hydrated through the sepsis protocol and his lactic acidosis resolved.   Assessment/Plan:   Sepsis and febrile neutropenia: Unlikely cholangitis as his LFTs are within normal. C. Dif PCR negative, flu negative. Lactate 2.6 > 1.5. Neutropenia resolved. - Continues to be febrile: With ascites and tender abdomen, will add flagyl 11/15 for anaerobic coverage (e.g. SBP). Volume not sufficient for paracentesis. - Continue cefepime  - Blood cultures and urine cultures 06/12/2016: NGTD  Sinus tachycardia: Picked up on telemetry as AFib w/RVR, no history of this. ST confirmed on ECG. T wave flattening/inversion stable in anteroseptal leads from prior. Sequela of sepsis. - Telemetry - Echo  Partial SBO: Repeat CT abdomen due to failure to improve with abdominal pain performed 11/15 suggestive of partial SBO due to small bowel wall thickening in the setting of opioid analgesics. +flatus  - General surgery consulted. 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 Benay Spice, s/p EUS with Bx Feb 2017 at [facility]  - Will follow up with Dr. Benay Spice 11/20 to discuss treatment options including further chemo versus supportive therapies. - Per Oncology - Pain control: Transition off dilaudid bolus-only PCA (8.4mg /24hrs) > dilaudid 0.5mg  IV q2h prn doses. Hope to minimize opioids w/pSBO. Plan to find po regimen with balanced bowel regimen prior to D/C.  Watery diarrhea: Resolved. CDiff PCR negative.   Hypokalemia: Resolved.  DVT prophylaxis: lovenox Family Communication: Wife and sister at bedside Disposition Plan/Barrier to D/C: Home with HH-PT pending improvement in SIRS on IV abx, and pain control transitioning from PCA Code Status: Full  IV Access:    Peripheral IV   Procedures and diagnostic studies:   Ct Abdomen Pelvis W Contrast  Result Date: 06/15/2016 CLINICAL DATA:  Abdominal pain, cough and dyspnea; history of actinic keratoses, idiopathic interstitial fibrosis, pancreatic carcinoma, common bile duct with stent placement 09/23/2015. EXAM: CT ABDOMEN AND PELVIS WITH CONTRAST TECHNIQUE: Multidetector CT imaging of the abdomen and pelvis was performed using the standard protocol following bolus administration of intravenous contrast. CONTRAST:  165mL ISOVUE-300 IOPAMIDOL (ISOVUE-300) INJECTION 61% COMPARISON:  06/12/2016 CT. FINDINGS: Lower chest: Small bilateral pleural effusions have developed in the interim with adjacent compressive atelectasis. Mosaic ground-glass opacities in the right middle lobe and lingula. Coronary arteriosclerosis noted of the borderline enlarged heart. No pericardial effusion. Catheter tip is seen in the distal SVC. Hepatobiliary: No significant change with reference to the multiple hepatic metastases compared to recent study 3 days ago, the largest in the right hepatic lobe measuring approximately 3.2 cm. No biliary dilatation. Sympathetic thickening of the gallbladder without stones due to  adjacent ascites Pancreas: Stable 1.6 cm mass in the head of the pancreas without pancreatic ductal dilatation. Spleen: Normal in size without focal abnormality. Adrenals/Urinary Tract: Normal bilateral adrenal glands. 1.3 cm enhancing mass off the interpolar aspect of the right kidney is again seen without significant interval change from recent comparison but concerning for renal cell carcinoma given its enhancement. No obstructive uropathy. Left kidney is unremarkable. Bladder contains a Foley catheter and is slightly decompressed as a result. Tiny focus of air within the bladder consistent with instrumentation. Stomach/Bowel: The stomach is not distended. There is no gastric wall thickening. No gastric outlet obstruction. Normal bowel rotation seen. Segmental areas of small bowel thickening are noted in the left hemi abdomen involving unopacified jejunum as well some right hemi abdomen involving opacified (with oral contrast) ileal loops. Thickening of the ileal loops are believed to account for more proximal small bowel dilatation up to 3 cm. Contrast however reaches the cecum without obstruction. Findings likely represent mild to moderate enteritis with mild partial small bowel obstruction. Vascular/Lymphatic: Abdominal aortic atherosclerosis without aneurysm. No lymphadenopathy. Reproductive: Prostate is enlarged. Other: Moderate volume of ascites, increased since prior exam adjacent to the liver and predominantly within the pelvis. Musculoskeletal: No worrisome lytic or blastic disease. No acute osseus abnormality. IMPRESSION: New since recent comparison exam are bilateral small pleural effusions with compressive atelectasis and slight ground-glass mosaic appearance of the right middle lobe and lingula, interval increase in moderate volume ascites, and segmental thickening of small bowel loops involving un-opacified jejunum and opacified ileal loops, the thickening involving the ileum appears be contributing  to a mild partial SBO. Unchanged hepatic metastasis, pancreatic head neoplasm and right interpolar enhancing renal mass. Electronically Signed   By: Ashley Royalty M.D.   On: 06/15/2016 15:24   Medical Consultants:    General surgery 11/15 for partial SBO.  Anti-Infectives:   Cefepime 06/12/2016 Flagyl 06/16/2016  Subjective:   Has used dilaudid PCA with improvement in abdominal pain. LBM 11/12, minimal output s/p enema 11/14. No N/V or anything po. +flatus  Objective:    Vitals:   06/16/16 0537 06/16/16 0547 06/16/16 0835 06/16/16 1430  BP:  107/62  (!) 94/57  Pulse:  93  80  Resp: (!) 28  (!) 23 20  Temp:  98.7 F (37.1 C)  97.9 F (36.6 C)  TempSrc:  Oral  Oral  SpO2: 93% 96% 95% 99%  Weight:      Height:        Intake/Output Summary (Last 24 hours) at 06/16/16 1612 Last data filed at 06/16/16 1500  Gross per 24 hour  Intake             3630 ml  Output             1500 ml  Net             2130 ml   Filed Weights   06/12/16 2150 06/13/16 0239  Weight: 63.5 kg (140 lb) 69.3 kg (152 lb 11.2 oz)    Exam: General exam: Chronically ill-appearing, tired-appearing male in no acute distress. Respiratory system: Good air movement and clear to auscultation. Cardiovascular system: S1 & S2 heard, RRR. No JVD, murmurs, rubs, gallops or clicks.  Gastrointestinal system: General right-side tenderness to palpation worst in RLQ with guarding, no rebound. RLQ mass vs. bubble palpated. Mildly distended, no fluid wave. Hypoactive bowel sounds.  Neuro: No focal deficits, but diffusely weak.  Extremities: No pedal edema. Skin: No rashes, lesions or  ulcers Psychiatry: Judgement and insight appear normal. Mood & affect appropriate.    Data Reviewed:    Labs: Basic Metabolic Panel:  Recent Labs Lab 06/12/16 2239 06/13/16 0218 06/13/16 0535 06/14/16 0432 06/15/16 0337  NA 137  --  134* 137 136  K 3.4*  --  4.2 4.3 3.9  CL 103  --  105 111 109  CO2 26  --  25 22 24     GLUCOSE 129*  --  127* 88 75  BUN 13  --  11 12 13   CREATININE 0.94  --  0.71 0.54* 0.48*  CALCIUM 8.4*  --  7.4* 7.8* 7.8*  MG  --  1.6*  --  1.9  --    GFR Estimated Creatinine Clearance: 91.4 mL/min (by C-G formula based on SCr of 0.48 mg/dL (L)). Liver Function Tests:  Recent Labs Lab 06/12/16 2239 06/13/16 0535 06/14/16 0432 06/15/16 0337  AST 40 28 20 16   ALT 18 13* 13* 11*  ALKPHOS 189* 128* 97 75  BILITOT 0.7 0.4 0.7 0.6  PROT 6.6 5.2* 5.3* 5.0*  ALBUMIN 3.3* 2.4* 2.3* 2.1*    Recent Labs Lab 06/12/16 2239  LIPASE 12   CBC:  Recent Labs Lab 06/12/16 2239 06/13/16 0535 06/14/16 0432 06/15/16 0337  WBC 1.9* 2.8* 5.4 6.9  NEUTROABS 1.5* 2.4 4.8 6.0  HGB 12.4* 9.6* 11.2* 10.1*  HCT 37.7* 29.3* 34.7* 31.7*  MCV 87.7 87.5 88.3 87.8  PLT 167 123* 115* 104*   Sepsis Labs:  Recent Labs Lab 06/12/16 2239 06/13/16 0535 06/13/16 0545 06/13/16 2003 06/14/16 0432 06/15/16 0337  WBC 1.9* 2.8*  --   --  5.4 6.9  LATICACIDVEN  --   --  2.6* 1.5  --   --    Microbiology Recent Results (from the past 240 hour(s))  Blood culture (routine x 2)     Status: None (Preliminary result)   Collection Time: 06/12/16 10:40 PM  Result Value Ref Range Status   Specimen Description BLOOD LEFT ARM  Final   Special Requests BOTTLES DRAWN AEROBIC AND ANAEROBIC 6ML  Final   Culture   Final    NO GROWTH 3 DAYS Performed at [facility]    Report Status PENDING  Incomplete  Blood culture (routine x 2)     Status: None (Preliminary result)   Collection Time: 06/12/16 10:41 PM  Result Value Ref Range Status   Specimen Description BLOOD LEFT ANTECUBITAL  Final   Special Requests BOTTLES DRAWN AEROBIC AND ANAEROBIC 6ML  Final   Culture   Final    NO GROWTH 3 DAYS Performed at [facility]    Report Status PENDING  Incomplete  MRSA PCR Screening     Status: None   Collection Time: 06/13/16  4:00 AM  Result Value Ref Range Status   MRSA by PCR NEGATIVE  NEGATIVE Final    Comment:        The GeneXpert MRSA Assay (FDA approved for NASAL specimens only), is one component of a comprehensive MRSA colonization surveillance program. It is not intended to diagnose MRSA infection nor to guide or monitor treatment for MRSA infections.   Urine culture     Status: None   Collection Time: 06/13/16 12:49 PM  Result Value Ref Range Status   Specimen Description URINE, CLEAN CATCH  Final   Special Requests NONE  Final   Culture NO GROWTH Performed at [facility]   Final   Report Status 06/14/2016 FINAL  Final  C difficile quick scan w PCR reflex     Status: None   Collection Time: 06/13/16 12:49 PM  Result Value Ref Range Status   C Diff antigen NEGATIVE NEGATIVE Final   C Diff toxin NEGATIVE NEGATIVE Final   C Diff interpretation No C. difficile detected.  Final   Medications:   . ceFEPime (MAXIPIME) IV  2 g Intravenous Q8H  . mouth rinse  15 mL Mouth Rinse BID  . metronidazole  500 mg Intravenous Q8H  . pantoprazole (PROTONIX) IV  40 mg Intravenous Q24H  . sodium chloride flush  3 mL Intravenous Q12H  . tamsulosin  0.4 mg Oral QPC breakfast   Continuous Infusions: . sodium chloride 75 mL/hr at 06/16/16 1257   Time spent:25 min   LOS: 3 days   Vance Gather  [facility] Pager [phone]  *Please refer to [email or web address], password [email or web address] to get updated schedule on who will round on this patient, as hospitalists switch teams weekly. If 7PM-7AM, please contact night-coverage at [email or web address], password [email or web address] for any overnight needs.  06/16/2016, 4:12 PM

## 2016-06-17 ENCOUNTER — Inpatient Hospital Stay (HOSPITAL_COMMUNITY): Payer: 59

## 2016-06-17 DIAGNOSIS — I4891 Unspecified atrial fibrillation: Secondary | ICD-10-CM

## 2016-06-17 LAB — CBC WITH DIFFERENTIAL/PLATELET
BASOS PCT: 0 %
Basophils Absolute: 0 10*3/uL (ref 0.0–0.1)
Eosinophils Absolute: 0 10*3/uL (ref 0.0–0.7)
Eosinophils Relative: 0 %
HEMATOCRIT: 32 % — AB (ref 39.0–52.0)
HEMOGLOBIN: 10.7 g/dL — AB (ref 13.0–17.0)
LYMPHS ABS: 0.6 10*3/uL — AB (ref 0.7–4.0)
LYMPHS PCT: 7 %
MCH: 28.3 pg (ref 26.0–34.0)
MCHC: 33.4 g/dL (ref 30.0–36.0)
MCV: 84.7 fL (ref 78.0–100.0)
MONOS PCT: 9 %
Monocytes Absolute: 0.8 10*3/uL (ref 0.1–1.0)
NEUTROS ABS: 7.2 10*3/uL (ref 1.7–7.7)
NEUTROS PCT: 84 %
Platelets: 117 10*3/uL — ABNORMAL LOW (ref 150–400)
RBC: 3.78 MIL/uL — ABNORMAL LOW (ref 4.22–5.81)
RDW: 15.8 % — ABNORMAL HIGH (ref 11.5–15.5)
WBC: 8.6 10*3/uL (ref 4.0–10.5)

## 2016-06-17 LAB — BASIC METABOLIC PANEL
Anion gap: 8 (ref 5–15)
BUN: 11 mg/dL (ref 6–20)
CHLORIDE: 107 mmol/L (ref 101–111)
CO2: 24 mmol/L (ref 22–32)
CREATININE: 0.55 mg/dL — AB (ref 0.61–1.24)
Calcium: 7.5 mg/dL — ABNORMAL LOW (ref 8.9–10.3)
GFR calc non Af Amer: >60 mL/min (ref >60)
GLUCOSE: 77 mg/dL (ref 65–99)
Potassium: 2.9 mmol/L — ABNORMAL LOW (ref 3.5–5.1)
Sodium: 139 mmol/L (ref 135–145)

## 2016-06-17 LAB — CULTURE, BLOOD (ROUTINE X 2)
CULTURE: NO GROWTH
Culture: NO GROWTH

## 2016-06-17 LAB — ECHOCARDIOGRAM COMPLETE
Height: 69 in
Weight: 2443.2 oz

## 2016-06-17 MED ORDER — SIMETHICONE 40 MG/0.6ML PO SUSP
40.0000 mg | Freq: Four times a day (QID) | ORAL | Status: DC | PRN
  Administered 2016-06-17 (×2): 40 mg via ORAL
  Filled 2016-06-17 (×3): qty 0.6

## 2016-06-17 MED ORDER — PANCRELIPASE (LIP-PROT-AMYL) 3000-9500 UNITS PO CPEP: 3.0000 | ORAL_CAPSULE | Freq: Three times a day (TID) | ORAL | Status: DC

## 2016-06-17 MED ORDER — HYDROMORPHONE HCL 2 MG PO TABS
2.0000 mg | ORAL_TABLET | ORAL | Status: DC | PRN
  Administered 2016-06-17: 4 mg via ORAL
  Administered 2016-06-17: 2 mg via ORAL
  Administered 2016-06-17: 4 mg via ORAL
  Administered 2016-06-17: 2 mg via ORAL
  Administered 2016-06-18 – 2016-06-20 (×12): 4 mg via ORAL
  Filled 2016-06-17: qty 2
  Filled 2016-06-17: qty 1
  Filled 2016-06-17 (×12): qty 2
  Filled 2016-06-17: qty 1
  Filled 2016-06-17: qty 2

## 2016-06-17 MED ORDER — MIRTAZAPINE 15 MG PO TABS
15.0000 mg | ORAL_TABLET | Freq: Every day | ORAL | Status: DC
  Administered 2016-06-17 – 2016-06-20 (×4): 15 mg via ORAL
  Filled 2016-06-17 (×4): qty 1

## 2016-06-17 MED ORDER — PANCRELIPASE (LIP-PROT-AMYL) 12000-38000 UNITS PO CPEP
12000.0000 [IU] | ORAL_CAPSULE | Freq: Three times a day (TID) | ORAL | Status: DC
  Administered 2016-06-17 – 2016-06-21 (×9): 12000 [IU] via ORAL
  Filled 2016-06-17 (×9): qty 1

## 2016-06-17 NOTE — Progress Notes (Signed)
Physical Therapy Treatment Patient Details Name: Roy English MRN: [id number] DOB: Oct 11, 1950 Today's Date: 06/17/2016    History of Present Illness 65 yo male admitted with neutropenia, SBO. hx of pancreatic cancer, OA, fibrosis of lungs, chronic abd pain    PT Comments    Progressing with mobility. Min encouragement required from family for pt to agree to ambulate. Dyspnea 2-3/4 with ambulation. He tolerated distance fairly well. Encouraged pt to start ambulating more with family/nursing.   Follow Up Recommendations  Home health PT;Supervision/Assistance - 24 hour     Equipment Recommendations  Rolling walker with 5" wheels (possibly)    Recommendations for Other Services       Precautions / Restrictions Precautions Precautions: Fall Restrictions Weight Bearing Restrictions: No    Mobility  Bed Mobility Overal bed mobility: Needs Assistance Bed Mobility: Supine to Sit     Supine to sit: Min assist     General bed mobility comments: small handheld assist given to get to EOB. Increased time  Transfers Overall transfer level: Needs assistance Equipment used: Rolling walker (2 wheeled);None Transfers: Sit to/from Stand Sit to Stand: Min assist         General transfer comment: small amount of assist to rise, stabilize, control descent (to low toilet surface). VCs safety, hand placement.   Ambulation/Gait Ambulation/Gait assistance: Min assist Ambulation Distance (Feet): 150 Feet Assistive device: Rolling walker (2 wheeled) Gait Pattern/deviations: Step-through pattern;Decreased stride length     General Gait Details: Unsteady at times. Intermittent assist to stabilize. Dyspnea 2-3/4 with ambulation. Pt fatigues fairly easily.    Stairs            Wheelchair Mobility    Modified Rankin (Stroke Patients Only)       Balance                                    Cognition Arousal/Alertness: Awake/alert Behavior During Therapy: WFL  for tasks assessed/performed Overall Cognitive Status: Within Functional Limits for tasks assessed                      Exercises      General Comments        Pertinent Vitals/Pain Pain Assessment: Faces Faces Pain Scale: Hurts even more Pain Location: abdomen Pain Descriptors / Indicators: Sore;Grimacing Pain Intervention(s): Monitored during session    Home Living                      Prior Function            PT Goals (current goals can now be found in the care plan section) Progress towards PT goals: Progressing toward goals    Frequency    Min 3X/week      PT Plan Current plan remains appropriate    Co-evaluation             End of Session   Activity Tolerance: Patient limited by fatigue;Patient limited by pain Patient left: in chair;with call bell/phone within reach;with family/visitor present     Time: 1345-1407 PT Time Calculation (min) (ACUTE ONLY): 22 min  Charges:  $Gait Training: 8-22 mins                    G Codes:      Weston Anna, MPT Pager: [phone]

## 2016-06-17 NOTE — Progress Notes (Signed)
[facility] PROGRESS NOTE    Progress Note  Roy English  [id number] DOB: November 29, 1950 DOA: 06/12/2016 PCP: Golden Pop, MD     Brief Narrative:   ADREW English is an 65 y.o. 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 Patient woke up on the morning of admission starting having emesis 5-6 loose stools for the past 2 days. In the ED was found to have hypotensive post hydromorphone so he was started on normal saline, Abdominal ultrasound was done that showed minimal ascites,CT scan of the abdomen and pelvis showed right-sided colitis. He was aggressively fluid hydrated through the sepsis protocol and his lactic acidosis resolved.   Assessment/Plan:   Sepsis and febrile neutropenia: Unlikely cholangitis as his LFTs are within normal. C. Dif PCR negative, flu negative. Lactate 2.6 > 1.5.  - Last febrile 11/14 PM - Plan to transition to PO abx as soon as can reliably take po. - Added flagyl 11/15 for anaerobic coverage (e.g. SBP). Ascites volume not sufficient for paracentesis. - Neutropenia resolved s/p GCSF - Continue cefepime  - Blood cultures and urine cultures 06/12/2016: NGTD  Sinus tachycardia: Picked up on telemetry as AFib w/RVR, no history of this. ST confirmed on ECG. T wave flattening/inversion stable in anteroseptal leads from prior. Sequela of sepsis. Echo: preserved EF, G1DD, no WMA. No significant cardiac history. - Telemetry  Partial SBO: Repeat CT abdomen due to failure to improve with abdominal pain performed 11/15 suggestive of partial SBO due to small bowel wall thickening in the setting of opioid analgesics. Resolved with BM 11/15 - General surgery consulted: Advancing diet slowly - IV PPI - Zofran prn - OOB, PT/OT  Idiopathic interstitial lung disease:  Chronic, stable - Continue supplemental oxygen prn  Poorly-differentiated pancreatic adenocarcinoma with hepatic metastases: Followed by Dr. Benay Spice, s/p EUS with Bx Feb 2017 at [facility].  - Will follow up with Dr. Benay Spice 11/20 to discuss treatment options including further chemo versus supportive therapies. - Per Oncology - Pain control: Dilaudid 0.5mg  IV q2h prn doses required x5 = 2.5mg  TDD, will transition to PO.  - Discussed pain management vs. eradication. Hope to minimize opioids w/pSBO. Plan to find po regimen with balanced bowel regimen prior to D/C.  Watery diarrhea: Resolved. CDiff PCR negative.   Hypokalemia: Resolved.  DVT prophylaxis: lovenox Family Communication: Wife and sister at bedside Disposition Plan/Barrier to D/C: Home with HH-PT pending improvement in SIRS on IV abx, and pain control transitioning from IV to PO Code Status: Full  IV Access:    Peripheral IV  Procedures and diagnostic studies:   Dg Abd Portable 1v  Result Date: 06/17/2016 CLINICAL DATA:  Pancreatic carcinoma. Recent small bowel obstruction EXAM: PORTABLE ABDOMEN - 1 VIEW COMPARISON:  CT abdomen and pelvis June 15, 2016. FINDINGS: Contrast is seen in the left colon. There are diverticular in the left colon. There is no appreciable bowel dilatation or air-fluid level to suggest bowel obstruction. No evident free air. There are phleboliths in the pelvis. IMPRESSION: Apparent interval resolution of bowel obstruction. Left colonic diverticulum. No free air. Electronically Signed   By: Lowella Grip III M.D.   On: 06/17/2016 10:02   Medical Consultants:    General surgery 11/15 for partial SBO.  Oncology, Dr. Benay Spice  Anti-Infectives:   Cefepime 06/12/2016 Flagyl 06/16/2016  Subjective:  Used 2.5mg  IV dilaudid/24 hours, down dramatically. Had BM yesterday, still distended but improved. Trying to take gatorade but finding it very difficult. No emesis.   Objective:    Vitals:     06/16/16 1430 06/16/16 2158 06/17/16 0524 06/17/16 1312  BP: (!) 94/57 113/62 97/63 124/70  Pulse: 80 68 89 93  Resp: 20 14 13 16   Temp: 97.9 F (36.6 C) 98.5 F (36.9 C) 98.2 F (36.8 C) 98.3 F (36.8 C)  TempSrc: Oral Oral Oral Oral  SpO2: 99% 96% 93% 97%  Weight:      Height:        Intake/Output Summary (Last 24 hours) at 06/17/16 2107 Last data filed at 06/17/16 1800  Gross per 24 hour  Intake             2275 ml  Output              450 ml  Net             1825 ml   Filed Weights   06/12/16 2150 06/13/16 0239  Weight: 63.5 kg (140 lb) 69.3 kg (152 lb 11.2 oz)    Exam: General exam: Chronically ill-appearing, tired-appearing male in no acute distress. Respiratory system: Good air movement and clear to auscultation. Cardiovascular system: S1 & S2 heard, RRR. No JVD, murmurs, rubs, gallops or clicks.  Gastrointestinal system: General right-side tenderness to palpation worst in RLQ with guarding, no rebound. RLQ mass vs. bubble palpated. Mildly distended, no fluid wave. Hypoactive bowel sounds.  Neuro: No focal deficits, but diffusely weak.  Extremities: No pedal edema. Skin: No rashes, lesions or ulcers Psychiatry: Judgement and insight appear normal. Mood & affect appropriate.    Data Reviewed:    Labs: Basic Metabolic Panel:  Recent Labs Lab 06/12/16 2239 06/13/16 0218 06/13/16 0535 06/14/16 0432 06/15/16 0337 06/17/16 0343  NA 137  --  134* 137 136 139  K 3.4*  --  4.2 4.3 3.9 2.9*  CL 103  --  105 111 109 107  CO2 26  --  25 22 24 24   GLUCOSE 129*  --  127* 88 75 77  BUN 13  --  11 12 13 11   CREATININE 0.94  --  0.71 0.54* 0.48* 0.55*  CALCIUM 8.4*  --  7.4* 7.8* 7.8* 7.5*  MG  --  1.6*  --  1.9  --   --    GFR Estimated Creatinine Clearance: 91.4 mL/min (by C-G formula based on SCr of 0.55 mg/dL (L)). Liver Function Tests:  Recent Labs Lab 06/12/16 2239 06/13/16 0535 06/14/16 0432 06/15/16 0337  AST 40 28 20 16   ALT 18 13* 13* 11*   ALKPHOS 189* 128* 97 75  BILITOT 0.7 0.4 0.7 0.6  PROT 6.6 5.2* 5.3* 5.0*  ALBUMIN 3.3* 2.4* 2.3* 2.1*    Recent Labs Lab 06/12/16 2239  LIPASE 12   CBC:  Recent Labs Lab 06/12/16 2239 06/13/16 0535 06/14/16 0432 06/15/16 0337 06/17/16 0343  WBC 1.9* 2.8* 5.4 6.9 8.6  NEUTROABS 1.5* 2.4 4.8 6.0 7.2  HGB 12.4* 9.6* 11.2* 10.1* 10.7*  HCT 37.7* 29.3* 34.7* 31.7* 32.0*  MCV 87.7 87.5 88.3 87.8 84.7  PLT 167 123* 115* 104* 117*   Sepsis Labs:  Recent Labs Lab 06/13/16 0535 06/13/16 0545 06/13/16 2003 06/14/16 0432 06/15/16 0337 06/17/16 0343  WBC 2.8*  --   --  5.4 6.9 8.6  LATICACIDVEN  --  2.6* 1.5  --   --   --  Microbiology Recent Results (from the past 240 hour(s))  Blood culture (routine x 2)     Status: None   Collection Time: 06/12/16 10:40 PM  Result Value Ref Range Status   Specimen Description BLOOD LEFT ARM  Final   Special Requests BOTTLES DRAWN AEROBIC AND ANAEROBIC 6ML  Final   Culture   Final    NO GROWTH 5 DAYS Performed at [facility]    Report Status 06/17/2016 FINAL  Final  Blood culture (routine x 2)     Status: None   Collection Time: 06/12/16 10:41 PM  Result Value Ref Range Status   Specimen Description BLOOD LEFT ANTECUBITAL  Final   Special Requests BOTTLES DRAWN AEROBIC AND ANAEROBIC 6ML  Final   Culture   Final    NO GROWTH 5 DAYS Performed at [facility]    Report Status 06/17/2016 FINAL  Final  MRSA PCR Screening     Status: None   Collection Time: 06/13/16  4:00 AM  Result Value Ref Range Status   MRSA by PCR NEGATIVE NEGATIVE Final    Comment:        The GeneXpert MRSA Assay (FDA approved for NASAL specimens only), is one component of a comprehensive MRSA colonization surveillance program. It is not intended to diagnose MRSA infection nor to guide or monitor treatment for MRSA infections.   Urine culture     Status: None   Collection Time: 06/13/16 12:49 PM  Result Value Ref Range Status    Specimen Description URINE, CLEAN CATCH  Final   Special Requests NONE  Final   Culture NO GROWTH Performed at [facility]   Final   Report Status 06/14/2016 FINAL  Final  C difficile quick scan w PCR reflex     Status: None   Collection Time: 06/13/16 12:49 PM  Result Value Ref Range Status   C Diff antigen NEGATIVE NEGATIVE Final   C Diff toxin NEGATIVE NEGATIVE Final   C Diff interpretation No C. difficile detected.  Final   Medications:   . ceFEPime (MAXIPIME) IV  2 g Intravenous Q8H  . lipase/protease/amylase  12,000 Units Oral TID WC  . mouth rinse  15 mL Mouth Rinse BID  . metronidazole  500 mg Intravenous Q8H  . mirtazapine  15 mg Oral QHS  . pantoprazole (PROTONIX) IV  40 mg Intravenous Q24H  . sodium chloride flush  3 mL Intravenous Q12H  . tamsulosin  0.4 mg Oral QPC breakfast   Continuous Infusions: . sodium chloride 75 mL/hr at 06/17/16 1604   Time spent:25 min   LOS: 4 days   Vance Gather  [facility] Pager [phone]  *Please refer to [email or web address], password [email or web address] to get updated schedule on who will round on this patient, as hospitalists switch teams weekly. If 7PM-7AM, please contact night-coverage at [email or web address], password [email or web address] for any overnight needs.  06/17/2016, 9:07 PM

## 2016-06-17 NOTE — Progress Notes (Signed)
OT Cancellation Note  Patient Details Name: Roy English MRN: [id number] DOB: 09/20/1950   Cancelled Treatment:    Reason Eval/Treat Not Completed: Fatigue/lethargy limiting ability to participate Will check on pt in the morning  Kari Baars, OT [phone]  Payton Mccallum D 06/17/2016, 1:19 PM

## 2016-06-17 NOTE — Progress Notes (Signed)
IP PROGRESS NOTE  Subjective:   [name] reports having bowel movements. The pain is improved. His sister and wife are at the bedside. Objective: Vital signs in last 24 hours: Blood pressure 124/70, pulse 93, temperature 98.3 F (36.8 C), temperature source Oral, resp. rate 16, height 5\' 9"  (1.753 m), weight 152 lb 11.2 oz (69.3 kg), SpO2 97 %.  Intake/Output from previous day: 11/15 0701 - 11/16 0700 In: 550 [I.V.:400; IV Piggyback:150] Out: 1100 [Urine:1100]  Physical Exam:  Abdomen: Less Distended, tender in the right lower abdomen, no mass    Portacath/PICC-without erythema  Lab Results:  Recent Labs  06/15/16 0337 06/17/16 0343  WBC 6.9 8.6  HGB 10.1* 10.7*  HCT 31.7* 32.0*  PLT 104* 117*    BMET  Recent Labs  06/15/16 0337 06/17/16 0343  NA 136 139  K 3.9 2.9*  CL 109 107  CO2 24 24  GLUCOSE 75 77  BUN 13 11  CREATININE 0.48* 0.55*  CALCIUM 7.8* 7.5*    Studies/Results: Dg Abd Portable 1v  Result Date: 06/17/2016 CLINICAL DATA:  Pancreatic carcinoma. Recent small bowel obstruction EXAM: PORTABLE ABDOMEN - 1 VIEW COMPARISON:  CT abdomen and pelvis June 15, 2016. FINDINGS: Contrast is seen in the left colon. There are diverticular in the left colon. There is no appreciable bowel dilatation or air-fluid level to suggest bowel obstruction. No evident free air. There are phleboliths in the pelvis. IMPRESSION: Apparent interval resolution of bowel obstruction. Left colonic diverticulum. No free air. Electronically Signed   By: Lowella Grip III M.D.   On: 06/17/2016 10:02    Medications: I have reviewed the patient's current medications.  Assessment/Plan: 1. Pancreas cancer, clinical T3 N0, pancreas head mass, status post an EUS biopsy at [facility] on 09/17/2015 confirming a poorly differentiate a carcinoma  ultrasound staging 09/17/2015, T2 N0  CT chest at [facility] 09/29/2015 with patchy groundglass opacities throughout both lungs, nonspecific 5 mm  right lung nodule  MRI liver 09/07/2015 revealed a pancreas head mass with bile duct obstruction, no focal liver lesions, no lymphadenopathy  CT abdomen/pelvis 09/21/2015-pancreas head mass with intra-and extrahepatic bile duct dilation despite placement of a stent, suspicion for SMV involvement  Xeloda/radiation started 10/15/2015  Xeloda placed on hold beginning 11/07/2015 due to progression of skin rash  Xeloda resumed at a reduced dose of 500 mg twice daily 11/10/2015  Xeloda discontinued 11/19/2015  Radiation completed 11/24/2015  Restaging CTs 12/24/2015 with enlargement of the pancreas head mass and development of liver metastases  Gemcitabine/Abraxane day 1/day 8/day 15 schedule of a 28 day cycle, Started 01/09/2016  Cycle 2 gemcitabine/Abraxane day one/day 8/day 15 schedule of a 28 day cycle (02/06/2016, 02/13/2016, 02/20/2016)  Gemcitabine/Abraxane schedule changed to every 2 weeks due to neuropathy and fatigue  CA-19-9 improved 03/05/2016  Restaging CT 04/02/2016 with decreased size of the pancreas mass, overall stable size of liver metastases with enlargement of one lesion  Abraxane held from the chemotherapy regimen beginning 04/02/2016  CT abdomen/pelvis 06/12/2016 consistent with progression of hepatic metastases, decrease in the pancreas head mass, progressive ascites, irregular wall thickening at the small bowel in the right abdomen  2. Idiopathic interstitial fibrosis of the lungs  3. Pain secondary to #1  4. Weight loss  5. 14 mm enhancing posterior right renal mass on CT 09/21/2015 suspicious for a renal cell carcinoma.  6. Rash on trunk and extremities secondary to Xeloda. 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 Progressive 11/07/2015. Xeloda placed on  hold. Resumed at reduced dose 11/10/2015. Discontinued 11/19/2015.  7. Port-A-Cath placement 01/07/2016, interventional radiology  8. Fever 02/22/2016 status post  evaluation in the emergency department. Chest x-ray and cultures negative. Fever likely related to gemcitabine.He is taking Tylenol prophylactically for a few days following each treatment.  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  10.  Admission 06/12/2016 with nausea/vomiting, abdominal pain, and diarrhea-repeat CTs reveal areas of small bowel thickening and partial small bowel obstruction  [name] has less pain and he is having bowel movements. The abdomen is less distended today. I discussed the situation with his wife and sister.    Recommendations: 1. Advanced diet per Dr. Harlow Asa 2. Continue Dilaudid as needed for pain 3. We will reschedule his appointment on 06/21/2016 pending his status on 06/18/2016    LOS: 4 days   Betsy Coder, MD   06/17/2016, 5:35 PM

## 2016-06-17 NOTE — Progress Notes (Signed)
Echocardiogram 2D Echocardiogram has been performed.  Roy English 06/17/2016, 9:37 AM

## 2016-06-17 NOTE — Progress Notes (Signed)
General Surgery [facility]  Assessment & Plan:  Partial small bowel obstruction  Less pain overnight, two loose BM's late yesterday  Wants to drink this AM  Will allow clear liquids  Portable AXR to assess progress of CT contrast through GI tract  Will follow Pancreatic carcinoma, metastatic  On chemo Rx  XRT completed Spring 2017        Earnstine Regal, MD, [facility]       Office: [phone]    Subjective: Patient in bed, sister and wife at bedside.  Less pain.  Occasional flatus.  Two loose BM's yesterday afternoon, none overnight.  Would like to drink liquids.  Objective: Vital signs in last 24 hours: Temp:  [97.9 F (36.6 C)-98.5 F (36.9 C)] 98.2 F (36.8 C) (11/16 0524) Pulse Rate:  [68-89] 89 (11/16 0524) Resp:  [13-20] 13 (11/16 0524) BP: (94-113)/(57-63) 97/63 (11/16 0524) SpO2:  [93 %-99 %] 93 % (11/16 0524) Last BM Date: 06/16/16  Intake/Output from previous day: 11/15 0701 - 11/16 0700 In: 550 [I.V.:400; IV Piggyback:150] Out: 1100 [Urine:1100] Intake/Output this shift: Total I/O In: 785 [I.V.:635; IV Piggyback:150] Out: -   Physical Exam: HEENT - sclerae clear, mucous membranes moist Neck - soft Abdomen - soft, mild distension; few BS present; mild diffuse tenderness, poorly localized Neuro - alert & oriented, no focal deficits  Lab Results:   Recent Labs  06/15/16 0337 06/17/16 0343  WBC 6.9 8.6  HGB 10.1* 10.7*  HCT 31.7* 32.0*  PLT 104* 117*   BMET  Recent Labs  06/15/16 0337 06/17/16 0343  NA 136 139  K 3.9 2.9*  CL 109 107  CO2 24 24  GLUCOSE 75 77  BUN 13 11  CREATININE 0.48* 0.55*  CALCIUM 7.8* 7.5*   PT/INR No results for input(s): LABPROT, INR in the last 72 hours. Comprehensive Metabolic Panel:    Component Value Date/Time   NA 139 06/17/2016 0343   NA 136 06/15/2016 0337   NA 140 06/07/2016 1132   NA 141 05/21/2016 1030   K 2.9 (L) 06/17/2016 0343   K  3.9 06/15/2016 0337   K 3.9 06/07/2016 1132   K 4.0 05/21/2016 1030   CL 107 06/17/2016 0343   CL 109 06/15/2016 0337   CO2 24 06/17/2016 0343   CO2 24 06/15/2016 0337   CO2 29 06/07/2016 1132   CO2 29 05/21/2016 1030   BUN 11 06/17/2016 0343   BUN 13 06/15/2016 0337   BUN 7.7 06/07/2016 1132   BUN 9.6 05/21/2016 1030   CREATININE 0.55 (L) 06/17/2016 0343   CREATININE 0.48 (L) 06/15/2016 0337   CREATININE 0.7 06/07/2016 1132   CREATININE 0.8 05/21/2016 1030   GLUCOSE 77 06/17/2016 0343   GLUCOSE 75 06/15/2016 0337   GLUCOSE 82 06/07/2016 1132   GLUCOSE 133 05/21/2016 1030   CALCIUM 7.5 (L) 06/17/2016 0343   CALCIUM 7.8 (L) 06/15/2016 0337   CALCIUM 8.5 06/07/2016 1132   CALCIUM 8.8 05/21/2016 1030   AST 16 06/15/2016 0337   AST 20 06/14/2016 0432   AST 21 06/07/2016 1132   AST 18 05/21/2016 1030   ALT 11 (L) 06/15/2016 0337   ALT 13 (L) 06/14/2016 0432   ALT 9 06/07/2016 1132   ALT 7 05/21/2016 1030   ALKPHOS 75 06/15/2016 0337   ALKPHOS 97 06/14/2016 0432   ALKPHOS 215 (H) 06/07/2016 1132   ALKPHOS 223 (H) 05/21/2016 1030  BILITOT 0.6 06/15/2016 0337   BILITOT 0.7 06/14/2016 0432   BILITOT <0.22 06/07/2016 1132   BILITOT 0.22 05/21/2016 1030   PROT 5.0 (L) 06/15/2016 0337   PROT 5.3 (L) 06/14/2016 0432   PROT 6.5 06/07/2016 1132   PROT 6.6 05/21/2016 1030   ALBUMIN 2.1 (L) 06/15/2016 0337   ALBUMIN 2.3 (L) 06/14/2016 0432   ALBUMIN 2.8 (L) 06/07/2016 1132   ALBUMIN 3.0 (L) 05/21/2016 1030    Studies/Results: Ct Abdomen Pelvis W Contrast  Result Date: 06/15/2016 CLINICAL DATA:  Abdominal pain, cough and dyspnea; history of actinic keratoses, idiopathic interstitial fibrosis, pancreatic carcinoma, common bile duct with stent placement 09/23/2015. EXAM: CT ABDOMEN AND PELVIS WITH CONTRAST TECHNIQUE: Multidetector CT imaging of the abdomen and pelvis was performed using the standard protocol following bolus administration of intravenous contrast. CONTRAST:  165mL  ISOVUE-300 IOPAMIDOL (ISOVUE-300) INJECTION 61% COMPARISON:  06/12/2016 CT. FINDINGS: Lower chest: Small bilateral pleural effusions have developed in the interim with adjacent compressive atelectasis. Mosaic ground-glass opacities in the right middle lobe and lingula. Coronary arteriosclerosis noted of the borderline enlarged heart. No pericardial effusion. Catheter tip is seen in the distal SVC. Hepatobiliary: No significant change with reference to the multiple hepatic metastases compared to recent study 3 days ago, the largest in the right hepatic lobe measuring approximately 3.2 cm. No biliary dilatation. Sympathetic thickening of the gallbladder without stones due to adjacent ascites Pancreas: Stable 1.6 cm mass in the head of the pancreas without pancreatic ductal dilatation. Spleen: Normal in size without focal abnormality. Adrenals/Urinary Tract: Normal bilateral adrenal glands. 1.3 cm enhancing mass off the interpolar aspect of the right kidney is again seen without significant interval change from recent comparison but concerning for renal cell carcinoma given its enhancement. No obstructive uropathy. Left kidney is unremarkable. Bladder contains a Foley catheter and is slightly decompressed as a result. Tiny focus of air within the bladder consistent with instrumentation. Stomach/Bowel: The stomach is not distended. There is no gastric wall thickening. No gastric outlet obstruction. Normal bowel rotation seen. Segmental areas of small bowel thickening are noted in the left hemi abdomen involving unopacified jejunum as well some right hemi abdomen involving opacified (with oral contrast) ileal loops. Thickening of the ileal loops are believed to account for more proximal small bowel dilatation up to 3 cm. Contrast however reaches the cecum without obstruction. Findings likely represent mild to moderate enteritis with mild partial small bowel obstruction. Vascular/Lymphatic: Abdominal aortic  atherosclerosis without aneurysm. No lymphadenopathy. Reproductive: Prostate is enlarged. Other: Moderate volume of ascites, increased since prior exam adjacent to the liver and predominantly within the pelvis. Musculoskeletal: No worrisome lytic or blastic disease. No acute osseus abnormality. IMPRESSION: New since recent comparison exam are bilateral small pleural effusions with compressive atelectasis and slight ground-glass mosaic appearance of the right middle lobe and lingula, interval increase in moderate volume ascites, and segmental thickening of small bowel loops involving un-opacified jejunum and opacified ileal loops, the thickening involving the ileum appears be contributing to a mild partial SBO. Unchanged hepatic metastasis, pancreatic head neoplasm and right interpolar enhancing renal mass. Electronically Signed   By: Ashley Royalty M.D.   On: 06/15/2016 15:24      Chester M 06/17/2016  Patient ID: Roy English, male   DOB: Jan 13, 1951, 65 y.o.   MRN: [id number]

## 2016-06-18 ENCOUNTER — Other Ambulatory Visit: Payer: Self-pay | Admitting: *Deleted

## 2016-06-18 DIAGNOSIS — C250 Malignant neoplasm of head of pancreas: Secondary | ICD-10-CM

## 2016-06-18 LAB — CBC WITH DIFFERENTIAL/PLATELET
BASOS ABS: 0 10*3/uL (ref 0.0–0.1)
Basophils Relative: 0 %
EOS ABS: 0.1 10*3/uL (ref 0.0–0.7)
Eosinophils Relative: 1 %
HEMATOCRIT: 33.7 % — AB (ref 39.0–52.0)
HEMOGLOBIN: 11.4 g/dL — AB (ref 13.0–17.0)
LYMPHS PCT: 9 %
Lymphs Abs: 0.9 10*3/uL (ref 0.7–4.0)
MCH: 28.8 pg (ref 26.0–34.0)
MCHC: 33.8 g/dL (ref 30.0–36.0)
MCV: 85.1 fL (ref 78.0–100.0)
MONOS PCT: 8 %
Monocytes Absolute: 0.8 10*3/uL (ref 0.1–1.0)
NEUTROS ABS: 8.2 10*3/uL — AB (ref 1.7–7.7)
NEUTROS PCT: 82 %
Platelets: 143 10*3/uL — ABNORMAL LOW (ref 150–400)
RBC: 3.96 MIL/uL — AB (ref 4.22–5.81)
RDW: 16.3 % — ABNORMAL HIGH (ref 11.5–15.5)
WBC: 10 10*3/uL (ref 4.0–10.5)

## 2016-06-18 LAB — COMPREHENSIVE METABOLIC PANEL
ALBUMIN: 1.9 g/dL — AB (ref 3.5–5.0)
ALK PHOS: 87 U/L (ref 38–126)
ALT: 9 U/L — ABNORMAL LOW (ref 17–63)
ANION GAP: 3 — AB (ref 5–15)
AST: 20 U/L (ref 15–41)
BUN: 12 mg/dL (ref 6–20)
CO2: 28 mmol/L (ref 22–32)
Calcium: 7.3 mg/dL — ABNORMAL LOW (ref 8.9–10.3)
Chloride: 109 mmol/L (ref 101–111)
Creatinine, Ser: 0.51 mg/dL — ABNORMAL LOW (ref 0.61–1.24)
GFR calc Af Amer: >60 mL/min (ref >60)
GFR calc non Af Amer: >60 mL/min (ref >60)
GLUCOSE: 166 mg/dL — AB (ref 65–99)
POTASSIUM: 3.7 mmol/L (ref 3.5–5.1)
SODIUM: 140 mmol/L (ref 135–145)
Total Bilirubin: 0.5 mg/dL (ref 0.3–1.2)
Total Protein: 4.9 g/dL — ABNORMAL LOW (ref 6.5–8.1)

## 2016-06-18 LAB — BASIC METABOLIC PANEL
ANION GAP: 6 (ref 5–15)
BUN: 13 mg/dL (ref 6–20)
CALCIUM: 7.4 mg/dL — AB (ref 8.9–10.3)
CHLORIDE: 107 mmol/L (ref 101–111)
CO2: 26 mmol/L (ref 22–32)
CREATININE: 0.48 mg/dL — AB (ref 0.61–1.24)
GFR calc non Af Amer: >60 mL/min (ref >60)
Glucose, Bld: 124 mg/dL — ABNORMAL HIGH (ref 65–99)
Potassium: 2.5 mmol/L — CL (ref 3.5–5.1)
SODIUM: 139 mmol/L (ref 135–145)

## 2016-06-18 LAB — CORTISOL-AM, BLOOD: CORTISOL - AM: 15.2 ug/dL (ref 6.7–22.6)

## 2016-06-18 LAB — LACTIC ACID, PLASMA
Lactic Acid, Venous: 1.2 mmol/L (ref 0.5–1.9)
Lactic Acid, Venous: 2.3 mmol/L (ref 0.5–1.9)

## 2016-06-18 LAB — MAGNESIUM: MAGNESIUM: 1.7 mg/dL (ref 1.7–2.4)

## 2016-06-18 MED ORDER — POTASSIUM CHLORIDE 20 MEQ/15ML (10%) PO SOLN
40.0000 meq | Freq: Once | ORAL | Status: AC
  Administered 2016-06-18: 40 meq via ORAL
  Filled 2016-06-18: qty 30

## 2016-06-18 MED ORDER — SODIUM CHLORIDE 0.9 % IV SOLN
30.0000 meq | Freq: Once | INTRAVENOUS | Status: AC
  Administered 2016-06-18: 30 meq via INTRAVENOUS
  Filled 2016-06-18: qty 15

## 2016-06-18 MED ORDER — ONDANSETRON HCL 8 MG PO TABS: 8.0000 mg | ORAL_TABLET | Freq: Four times a day (QID) | ORAL | 3 refills | Status: AC | PRN

## 2016-06-18 MED ORDER — SODIUM CHLORIDE 0.9 % IV BOLUS (SEPSIS)
500.0000 mL | Freq: Once | INTRAVENOUS | Status: DC
Start: 2016-06-18 — End: 2016-06-20

## 2016-06-18 MED ORDER — SENNOSIDES-DOCUSATE SODIUM 8.6-50 MG PO TABS
1.0000 | ORAL_TABLET | Freq: Every day | ORAL | Status: DC
  Administered 2016-06-18 – 2016-06-21 (×4): 1 via ORAL
  Filled 2016-06-18 (×4): qty 1

## 2016-06-18 MED ORDER — POLYETHYLENE GLYCOL 3350 17 G PO PACK
17.0000 g | PACK | Freq: Every day | ORAL | Status: DC
  Administered 2016-06-18 – 2016-06-21 (×4): 17 g via ORAL
  Filled 2016-06-18 (×4): qty 1

## 2016-06-18 NOTE — Progress Notes (Signed)
PT Cancellation Note  Patient Details Name: Roy English MRN: [id number] DOB: 26-Oct-1950   Cancelled Treatment:    Reason Eval/Treat Not Completed: Patient declined, no reason specified   Weston Anna, MPT Pager: [phone]

## 2016-06-18 NOTE — Progress Notes (Signed)
General Surgery [facility]  Assessment & Plan:  Partial small bowel obstruction             Clinically resolved  AXR yesterday with contrast in colon  Tolerating liquids Pancreatic carcinoma, metastatic             On chemo Rx             XRT completed Spring 2017  Will sign off.  Call if surgical evaluation needed.        Earnstine Regal, MD, [facility]       Office: [phone]    Subjective: Patient in bed, family at bedside.  Tolerating clear liquids.  BM yesterday.  No nausea or emesis.  Objective: Vital signs in last 24 hours: Temp:  [98.3 F (36.8 C)-98.7 F (37.1 C)] 98.7 F (37.1 C) (11/17 0651) Pulse Rate:  [93-115] 115 (11/17 0651) Resp:  [16-24] 24 (11/17 0651) BP: (99-124)/(55-70) 103/59 (11/17 0651) SpO2:  [86 %-97 %] 93 % (11/17 0658) Last BM Date: 06/17/16  Intake/Output from previous day: 11/16 0701 - 11/17 0700 In: 2925 [I.V.:2325; IV Piggyback:600] Out: 1000 [Urine:1000] Intake/Output this shift: No intake/output data recorded.  Physical Exam: HEENT - sclerae clear, mucous membranes moist Abdomen - soft, mild distension; mild diffuse tenderness stable; active BS present Ext - no edema, non-tender Neuro - alert & oriented, no focal deficits  Lab Results:   Recent Labs  06/17/16 0343 06/18/16 0519  WBC 8.6 10.0  HGB 10.7* 11.4*  HCT 32.0* 33.7*  PLT 117* 143*   BMET  Recent Labs  06/17/16 0343 06/18/16 0519  NA 139 139  K 2.9* 2.5*  CL 107 107  CO2 24 26  GLUCOSE 77 124*  BUN 11 13  CREATININE 0.55* 0.48*  CALCIUM 7.5* 7.4*   PT/INR No results for input(s): LABPROT, INR in the last 72 hours. Comprehensive Metabolic Panel:    Component Value Date/Time   NA 139 06/18/2016 0519   NA 139 06/17/2016 0343   NA 140 06/07/2016 1132   NA 141 05/21/2016 1030   K 2.5 (LL) 06/18/2016 0519   K 2.9 (L) 06/17/2016 0343   K 3.9 06/07/2016 1132   K 4.0 05/21/2016 1030   CL  107 06/18/2016 0519   CL 107 06/17/2016 0343   CO2 26 06/18/2016 0519   CO2 24 06/17/2016 0343   CO2 29 06/07/2016 1132   CO2 29 05/21/2016 1030   BUN 13 06/18/2016 0519   BUN 11 06/17/2016 0343   BUN 7.7 06/07/2016 1132   BUN 9.6 05/21/2016 1030   CREATININE 0.48 (L) 06/18/2016 0519   CREATININE 0.55 (L) 06/17/2016 0343   CREATININE 0.7 06/07/2016 1132   CREATININE 0.8 05/21/2016 1030   GLUCOSE 124 (H) 06/18/2016 0519   GLUCOSE 77 06/17/2016 0343   GLUCOSE 82 06/07/2016 1132   GLUCOSE 133 05/21/2016 1030   CALCIUM 7.4 (L) 06/18/2016 0519   CALCIUM 7.5 (L) 06/17/2016 0343   CALCIUM 8.5 06/07/2016 1132   CALCIUM 8.8 05/21/2016 1030   AST 16 06/15/2016 0337   AST 20 06/14/2016 0432   AST 21 06/07/2016 1132   AST 18 05/21/2016 1030   ALT 11 (L) 06/15/2016 0337   ALT 13 (L) 06/14/2016 0432   ALT 9 06/07/2016 1132   ALT 7 05/21/2016 1030   ALKPHOS 75 06/15/2016 0337   ALKPHOS 97 06/14/2016 0432   ALKPHOS 215 (H) 06/07/2016  1132   ALKPHOS 223 (H) 05/21/2016 1030   BILITOT 0.6 06/15/2016 0337   BILITOT 0.7 06/14/2016 0432   BILITOT <0.22 06/07/2016 1132   BILITOT 0.22 05/21/2016 1030   PROT 5.0 (L) 06/15/2016 0337   PROT 5.3 (L) 06/14/2016 0432   PROT 6.5 06/07/2016 1132   PROT 6.6 05/21/2016 1030   ALBUMIN 2.1 (L) 06/15/2016 0337   ALBUMIN 2.3 (L) 06/14/2016 0432   ALBUMIN 2.8 (L) 06/07/2016 1132   ALBUMIN 3.0 (L) 05/21/2016 1030    Studies/Results: Dg Abd Portable 1v  Result Date: 06/17/2016 CLINICAL DATA:  Pancreatic carcinoma. Recent small bowel obstruction EXAM: PORTABLE ABDOMEN - 1 VIEW COMPARISON:  CT abdomen and pelvis June 15, 2016. FINDINGS: Contrast is seen in the left colon. There are diverticular in the left colon. There is no appreciable bowel dilatation or air-fluid level to suggest bowel obstruction. No evident free air. There are phleboliths in the pelvis. IMPRESSION: Apparent interval resolution of bowel obstruction. Left colonic diverticulum. No  free air. Electronically Signed   By: Roy English.D.   On: 06/17/2016 10:02      Roy English 06/18/2016  Patient ID: Roy English, male   DOB: 1950/10/13, 65 y.o.   MRN: [id number]

## 2016-06-18 NOTE — Progress Notes (Signed)
Critical Lactic acid 2.3. MD paged to make aware.   Callie Fielding RN

## 2016-06-18 NOTE — Progress Notes (Signed)
IP PROGRESS NOTE  Subjective:   Roy English reports continues to have bowel movements and flatus. He tolerated liquids yesterday. He was unable to keep the Foley catheter out. Objective: Vital signs in last 24 hours: Blood pressure (!) 103/59, pulse (!) 115, temperature 98.7 F (37.1 C), temperature source Oral, resp. rate (!) 24, height 5\' 9"  (1.753 m), weight 152 lb 11.2 oz (69.3 kg), SpO2 93 %.  Intake/Output from previous day: 11/16 0701 - 11/17 0700 In: 2925 [I.V.:2325; IV Piggyback:600] Out: 1000 [Urine:1000]  Physical Exam:  Abdomen: Mildly distended, tender in the right lower abdomen, no mass    Portacath/PICC-without erythema  Lab Results:  Recent Labs  06/17/16 0343 06/18/16 0519  WBC 8.6 10.0  HGB 10.7* 11.4*  HCT 32.0* 33.7*  PLT 117* 143*    BMET  Recent Labs  06/17/16 0343 06/18/16 0519  NA 139 139  K 2.9* 2.5*  CL 107 107  CO2 24 26  GLUCOSE 77 124*  BUN 11 13  CREATININE 0.55* 0.48*  CALCIUM 7.5* 7.4*    Studies/Results: Dg Abd Portable 1v  Result Date: 06/17/2016 CLINICAL DATA:  Pancreatic carcinoma. Recent small bowel obstruction EXAM: PORTABLE ABDOMEN - 1 VIEW COMPARISON:  CT abdomen and pelvis June 15, 2016. FINDINGS: Contrast is seen in the left colon. There are diverticular in the left colon. There is no appreciable bowel dilatation or air-fluid level to suggest bowel obstruction. No evident free air. There are phleboliths in the pelvis. IMPRESSION: Apparent interval resolution of bowel obstruction. Left colonic diverticulum. No free air. Electronically Signed   By: Lowella Grip III M.D.   On: 06/17/2016 10:02    Medications: I have reviewed the patient's current medications.  Assessment/Plan: 1. Pancreas cancer, clinical T3 N0, pancreas head mass, status post an EUS biopsy at [facility] on 09/17/2015 confirming a poorly differentiate a carcinoma  ultrasound staging 09/17/2015, T2 N0  CT chest at [facility] 09/29/2015 with patchy  groundglass opacities throughout both lungs, nonspecific 5 mm right lung nodule  MRI liver 09/07/2015 revealed a pancreas head mass with bile duct obstruction, no focal liver lesions, no lymphadenopathy  CT abdomen/pelvis 09/21/2015-pancreas head mass with intra-and extrahepatic bile duct dilation despite placement of a stent, suspicion for SMV involvement  Xeloda/radiation started 10/15/2015  Xeloda placed on hold beginning 11/07/2015 due to progression of skin rash  Xeloda resumed at a reduced dose of 500 mg twice daily 11/10/2015  Xeloda discontinued 11/19/2015  Radiation completed 11/24/2015  Restaging CTs 12/24/2015 with enlargement of the pancreas head mass and development of liver metastases  Gemcitabine/Abraxane day 1/day 8/day 15 schedule of a 28 day cycle, Started 01/09/2016  Cycle 2 gemcitabine/Abraxane day one/day 8/day 15 schedule of a 28 day cycle (02/06/2016, 02/13/2016, 02/20/2016)  Gemcitabine/Abraxane schedule changed to every 2 weeks due to neuropathy and fatigue  CA-19-9 improved 03/05/2016  Restaging CT 04/02/2016 with decreased size of the pancreas mass, overall stable size of liver metastases with enlargement of one lesion  Abraxane held from the chemotherapy regimen beginning 04/02/2016  CT abdomen/pelvis 06/12/2016 consistent with progression of hepatic metastases, decrease in the pancreas head mass, progressive ascites, irregular wall thickening at the small bowel in the right abdomen  2. Idiopathic interstitial fibrosis of the lungs  3. Pain secondary to #1  4. Weight loss  5. 14 mm enhancing posterior right renal mass on CT 09/21/2015 suspicious for a renal cell carcinoma.  6. Rash on trunk and extremities secondary to Xeloda. Rash on back also in part due  to radiation dermatitis. Progressive 11/07/2015. Xeloda placed on hold. Resumed at reduced dose 11/10/2015. Discontinued 11/19/2015.  7. Port-A-Cath placement 01/07/2016,  interventional radiology  8. Fever 02/22/2016 status post evaluation in the emergency department. Chest x-ray and cultures negative. Fever likely related to gemcitabine.He is taking Tylenol prophylactically for a few days following each treatment.  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  10.  Admission 06/12/2016 with nausea/vomiting, abdominal pain, and diarrhea-repeat CTs reveal areas of small bowel thickening and partial small bowel obstruction 11.  urinary retention-Foley catheter in place  Roy English appears improved from hospital admission. He tolerated liquid diet yesterday and is having bowel movements. He will be scheduled for an office visit at the [facility] on 06/29/2016.  Recommendations: 1. Advanced diet and increase ambulation as tolerated 2. Continue Dilaudid as needed for pain 3. Follow-up as scheduled at the [facility] will be scheduled for 06/29/2016.  Please call Oncology as needed.    LOS: 5 days   Betsy Coder, MD   06/18/2016, 11:09 AM

## 2016-06-18 NOTE — Evaluation (Signed)
Occupational Therapy Evaluation Patient Details Name: Roy English MRN: [id number] DOB: 1951/01/09 Today's Date: 06/18/2016    History of Present Illness 64 yo male admitted with neutropenia, SBO. hx of pancreatic cancer, OA, fibrosis of lungs, chronic abd pain   Clinical Impression   Pt admitted with neutropenia. Pt currently with functional limitations due to the deficits listed below (see OT Problem List). Pt will benefit from skilled OT to increase their safety and independence with ADL and functional mobility for ADL to facilitate discharge to venue listed below.     Follow Up Recommendations  Home health OT    Equipment Recommendations  3 in 1 bedside comode       Precautions / Restrictions Precautions Precautions: Fall Restrictions Weight Bearing Restrictions: No      Mobility Bed Mobility               General bed mobility comments: Pt OOB  Transfers Overall transfer level: Needs assistance Equipment used: Rolling walker (2 wheeled);None   Sit to Stand: Min assist                   ADL Overall ADL's : Needs assistance/impaired Eating/Feeding: Sitting;Set up   Grooming: Set up;Sitting   Upper Body Bathing: Minimal assitance;Sitting   Lower Body Bathing: Minimal assistance;Sit to/from stand;Cueing for safety;Cueing for sequencing;Cueing for compensatory techniques   Upper Body Dressing : Minimal assistance;Sitting   Lower Body Dressing: Minimal assistance;Sit to/from stand;Cueing for safety;Cueing for sequencing;Cueing for compensatory techniques   Toilet Transfer: Minimal assistance;RW;Ambulation;Cueing for sequencing;Cueing for safety;BSC   Toileting- Clothing Manipulation and Hygiene: Moderate assistance;Sit to/from stand;Cueing for safety;Cueing for sequencing;Cueing for compensatory techniques         General ADL Comments: pt performed bath with OT as well as dressing.  This was first time pt has performed his own bath since  hospitalization               Pertinent Vitals/Pain Faces Pain Scale: Hurts a little bit Pain Location: tight abdomen Pain Descriptors / Indicators: Guarding;Sore Pain Intervention(s): Monitored during session;Repositioned     Hand Dominance     Extremity/Trunk Assessment Upper Extremity Assessment Upper Extremity Assessment: Generalized weakness           Communication Communication Communication: No difficulties   Cognition Arousal/Alertness: Awake/alert Behavior During Therapy: WFL for tasks assessed/performed Overall Cognitive Status: Within Functional Limits for tasks assessed                                Home Living Family/patient expects to be discharged to:: Private residence Living Arrangements: Spouse/significant other;Other relatives Available Help at Discharge: Family Type of Home: House Home Access: Stairs to enter     Home Layout: One level     Bathroom Shower/Tub: Aeronautical engineer: None          Prior Functioning/Environment Level of Independence: Independent                 OT Problem List: Decreased strength;Decreased activity tolerance;Pain   OT Treatment/Interventions: Self-care/ADL training;DME and/or AE instruction;Patient/family education    OT Goals(Current goals can be found in the care plan section) Acute Rehab OT Goals Patient Stated Goal: to get stronger. home.  OT Goal Formulation: With patient Time For Goal Achievement: 06/25/16 Potential to Achieve Goals: Good  OT Frequency: Min 2X/week   Barriers to D/C:  End of Session Equipment Utilized During Treatment: Rolling walker Nurse Communication: Mobility status  Activity Tolerance: Patient tolerated treatment well Patient left: in chair;with call bell/phone within reach   Time: 1029-1119 OT Time Calculation (min): 50 min Charges:  OT General Charges $OT Visit: 1 Procedure OT Evaluation $OT Eval  Moderate Complexity: 1 Procedure OT Treatments $Self Care/Home Management : 23-37 mins G-Codes:    Payton Mccallum D 07/14/2016, 11:26 AM

## 2016-06-18 NOTE — Progress Notes (Signed)
[facility] PROGRESS NOTE    Progress Note  Roy English  [id number] DOB: 21-Dec-1950 DOA: 06/12/2016 PCP: Golden Pop, MD   Brief Narrative:   Roy English is an 65 y.o. 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 Patient woke up on the morning of admission starting having emesis 5-6 loose stools for the past 2 days. In the ED was found to have hypotensive post hydromorphone so he was started on normal saline, Abdominal ultrasound was done that showed minimal ascites,CT scan of the abdomen and pelvis showed right-sided colitis. He was aggressively fluid hydrated through the sepsis protocol and lactic acidosis resolved. Repeat CT abdomen due to failure to improve with abdominal pain performed 11/15 suggestive of partial SBO due to small bowel wall thickening in the setting of opioid analgesics. Resolved with BM 11/15. He has ambulated, and continues to have abdominal pain controlled with po dilaudid. While tolerating fluids, he continues to have inadequate po intake limiting his ability to maintain hydration independently and reliably. IV antibiotics and IV fluids continuing.   Assessment/Plan:   Sepsis and febrile neutropenia: Unlikely cholangitis as his LFTs are within normal. C. Dif PCR negative, flu negative. Lactate 2.6 > 1.5 > 1.2.  - Last febrile 11/14 PM - Plan to transition to PO abx as soon as can reliably take po. - Added flagyl 11/15 for anaerobic coverage (e.g. SBP). Ascites volume not sufficient for paracentesis. - Neutropenia resolved s/p GCSF - Continue cefepime (started 11/11) - Blood cultures and urine cultures 06/12/2016: NGTD  Sinus tachycardia: Picked up on telemetry as AFib w/RVR, no history of this. ST confirmed on ECG. T wave flattening/inversion stable in  anteroseptal leads from prior. Sequela of sepsis. Echo: preserved EF, G1DD, no WMA. No significant cardiac history. - Telemetry continues to show intermittent PVC's and limited runs of VTach.  - Optimizing electrolytes - Will discuss with cardiology if persistent following electrolyte repletion. Currently hemodynamically stable  Partial SBO: Repeat CT abdomen due to failure to improve with abdominal pain performed 11/15 suggestive of partial SBO due to small bowel wall thickening in the setting of opioid analgesics. Resolved with BM 11/15 - General surgery consulted and signed off after resolution.  - Advancing diet slowly - IV PPI - Zofran prn - OOB, PT/OT  Idiopathic interstitial lung disease: Chronic, stable. 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 Benay Spice, s/p EUS with Bx Feb 2017 at [facility].  - Will follow up with Dr. Benay Spice 11/28 to discuss treatment options including further chemo versus supportive therapies. - Per Oncology - Pain controlled on home po dilaudid.  Watery diarrhea: Resolved. CDiff PCR negative.   Hypokalemia: Recurrent, related to reduced po intake. - Repleted this AM - Recheck with Mg in PM   Insomnia: Chronic, stable.  - Restarted remeron with improvement  DVT prophylaxis: lovenox Family Communication: Wife and sister at bedside Disposition Plan/Barrier to D/C: Home with HH-PT, OT, 3 in 1 and rolling walker ordered. Only barrier is his inability to maintain hydration by mouth. Code Status: Full  IV Access:    Peripheral IV  Procedures and diagnostic studies:   Dg Abd Portable 1v  Result Date: 06/17/2016 CLINICAL DATA:  Pancreatic carcinoma. Recent small bowel obstruction EXAM: PORTABLE ABDOMEN - 1 VIEW COMPARISON:  CT abdomen and  pelvis June 15, 2016. FINDINGS: Contrast is seen in the left colon. There are diverticular in the left colon. There is no appreciable  bowel dilatation or air-fluid level to suggest bowel obstruction. No evident free air. There are phleboliths in the pelvis. IMPRESSION: Apparent interval resolution of bowel obstruction. Left colonic diverticulum. No free air. Electronically Signed   By: Lowella Grip III M.D.   On: 06/17/2016 10:02   Medical Consultants:    General surgery 11/15 for partial SBO.  Oncology, Dr. Benay Spice  Anti-Infectives:   Cefepime 06/12/2016 Flagyl 06/16/2016  Subjective:   Pain controlled with po dilaudid. Had BM, ambulating, just not able to force himself to take much by mouth. No vomiting. Abdominal pain stable.   Objective:    Vitals:   06/17/16 1312 06/17/16 2133 06/18/16 0651 06/18/16 0658  BP: 124/70 (!) 99/55 (!) 103/59   Pulse: 93 95 (!) 115   Resp: 16 16 (!) 24   Temp: 98.3 F (36.8 C) 98.5 F (36.9 C) 98.7 F (37.1 C)   TempSrc: Oral Oral Oral   SpO2: 97% 90% (!) 86% 93%  Weight:      Height:        Intake/Output Summary (Last 24 hours) at 06/18/16 1243 Last data filed at 06/18/16 1128  Gross per 24 hour  Intake             1850 ml  Output             1600 ml  Net              250 ml   Filed Weights   06/12/16 2150 06/13/16 0239  Weight: 63.5 kg (140 lb) 69.3 kg (152 lb 11.2 oz)    Exam: General exam: Chronically ill-appearing, tired-appearing male in no acute distress. Respiratory system: Good air movement and clear to auscultation. Cardiovascular system: S1 & S2 heard, RRR. No JVD, murmurs, rubs, gallops or clicks.  Gastrointestinal system: General right-side tenderness to palpation worst in RLQ with guarding, no rebound. RLQ mass vs. bubble palpated. Mildly distended, no fluid wave. Hypoactive bowel sounds.  Neuro: No focal deficits, but diffusely weak.  Extremities: No pedal edema. Skin: No rashes, lesions or ulcers Psychiatry: Judgement and insight appear normal. Mood & affect appropriate.    Data Reviewed:    Labs: Basic Metabolic Panel:  Recent  Labs Lab 06/13/16 0218 06/13/16 0535 06/14/16 0432 06/15/16 0337 06/17/16 0343 06/18/16 0519  NA  --  134* 137 136 139 139  K  --  4.2 4.3 3.9 2.9* 2.5*  CL  --  105 111 109 107 107  CO2  --  25 22 24 24 26   GLUCOSE  --  127* 88 75 77 124*  BUN  --  11 12 13 11 13   CREATININE  --  0.71 0.54* 0.48* 0.55* 0.48*  CALCIUM  --  7.4* 7.8* 7.8* 7.5* 7.4*  MG 1.6*  --  1.9  --   --   --    GFR Estimated Creatinine Clearance: 91.4 mL/min (by C-G formula based on SCr of 0.48 mg/dL (L)). Liver Function Tests:  Recent Labs Lab 06/12/16 2239 06/13/16 0535 06/14/16 0432 06/15/16 0337  AST 40 28 20 16   ALT 18 13* 13* 11*  ALKPHOS 189* 128* 97 75  BILITOT 0.7 0.4 0.7 0.6  PROT 6.6 5.2* 5.3* 5.0*  ALBUMIN 3.3* 2.4* 2.3* 2.1*    Recent Labs Lab 06/12/16 2239  LIPASE 12   CBC:  Recent  Labs Lab 06/13/16 0535 06/14/16 0432 06/15/16 0337 06/17/16 0343 06/18/16 0519  WBC 2.8* 5.4 6.9 8.6 10.0  NEUTROABS 2.4 4.8 6.0 7.2 8.2*  HGB 9.6* 11.2* 10.1* 10.7* 11.4*  HCT 29.3* 34.7* 31.7* 32.0* 33.7*  MCV 87.5 88.3 87.8 84.7 85.1  PLT 123* 115* 104* 117* 143*   Sepsis Labs:  Recent Labs Lab 06/13/16 0545 06/13/16 2003 06/14/16 0432 06/15/16 0337 06/17/16 0343 06/18/16 0519 06/18/16 0805 06/18/16 1107  WBC  --   --  5.4 6.9 8.6 10.0  --   --   LATICACIDVEN 2.6* 1.5  --   --   --   --  1.2 2.3*   Microbiology Recent Results (from the past 240 hour(s))  Blood culture (routine x 2)     Status: None   Collection Time: 06/12/16 10:40 PM  Result Value Ref Range Status   Specimen Description BLOOD LEFT ARM  Final   Special Requests BOTTLES DRAWN AEROBIC AND ANAEROBIC 6ML  Final   Culture   Final    NO GROWTH 5 DAYS Performed at [facility]    Report Status 06/17/2016 FINAL  Final  Blood culture (routine x 2)     Status: None   Collection Time: 06/12/16 10:41 PM  Result Value Ref Range Status   Specimen Description BLOOD LEFT ANTECUBITAL  Final   Special  Requests BOTTLES DRAWN AEROBIC AND ANAEROBIC 6ML  Final   Culture   Final    NO GROWTH 5 DAYS Performed at [facility]    Report Status 06/17/2016 FINAL  Final  MRSA PCR Screening     Status: None   Collection Time: 06/13/16  4:00 AM  Result Value Ref Range Status   MRSA by PCR NEGATIVE NEGATIVE Final    Comment:        The GeneXpert MRSA Assay (FDA approved for NASAL specimens only), is one component of a comprehensive MRSA colonization surveillance program. It is not intended to diagnose MRSA infection nor to guide or monitor treatment for MRSA infections.   Urine culture     Status: None   Collection Time: 06/13/16 12:49 PM  Result Value Ref Range Status   Specimen Description URINE, CLEAN CATCH  Final   Special Requests NONE  Final   Culture NO GROWTH Performed at [facility]   Final   Report Status 06/14/2016 FINAL  Final  C difficile quick scan w PCR reflex     Status: None   Collection Time: 06/13/16 12:49 PM  Result Value Ref Range Status   C Diff antigen NEGATIVE NEGATIVE Final   C Diff toxin NEGATIVE NEGATIVE Final   C Diff interpretation No C. difficile detected.  Final   Medications:   . ceFEPime (MAXIPIME) IV  2 g Intravenous Q8H  . lipase/protease/amylase  12,000 Units Oral TID WC  . mouth rinse  15 mL Mouth Rinse BID  . metronidazole  500 mg Intravenous Q8H  . mirtazapine  15 mg Oral QHS  . pantoprazole (PROTONIX) IV  40 mg Intravenous Q24H  . sodium chloride  500 mL Intravenous Once  . sodium chloride flush  3 mL Intravenous Q12H  . tamsulosin  0.4 mg Oral QPC breakfast   Continuous Infusions: . sodium chloride 75 mL/hr at 06/18/16 0535   Time spent:25 min   LOS: 5 days   Vance Gather  [facility] Pager [phone]  *Please refer to [email or web address], password [email or web address] to get updated schedule on who will round on this  patient, as hospitalists switch teams weekly. If 7PM-7AM, please contact night-coverage at [email or web address], password  [email or web address] for any overnight needs.  06/18/2016, 12:43 PM

## 2016-06-18 NOTE — Progress Notes (Signed)
Pt wanted to have his port accessed so he wouldn't have to be stuck for blood draws, but changed his mind.

## 2016-06-19 LAB — CBC WITH DIFFERENTIAL/PLATELET
BASOS ABS: 0 10*3/uL (ref 0.0–0.1)
Basophils Relative: 0 %
EOS ABS: 0.2 10*3/uL (ref 0.0–0.7)
Eosinophils Relative: 1 %
HEMATOCRIT: 34.5 % — AB (ref 39.0–52.0)
Hemoglobin: 11.4 g/dL — ABNORMAL LOW (ref 13.0–17.0)
LYMPHS ABS: 1.1 10*3/uL (ref 0.7–4.0)
Lymphocytes Relative: 7 %
MCH: 28.6 pg (ref 26.0–34.0)
MCHC: 33 g/dL (ref 30.0–36.0)
MCV: 86.7 fL (ref 78.0–100.0)
MONO ABS: 0.8 10*3/uL (ref 0.1–1.0)
Monocytes Relative: 5 %
NEUTROS PCT: 87 %
Neutro Abs: 13.4 10*3/uL — ABNORMAL HIGH (ref 1.7–7.7)
PLATELETS: 178 10*3/uL (ref 150–400)
RBC: 3.98 MIL/uL — AB (ref 4.22–5.81)
RDW: 16.7 % — AB (ref 11.5–15.5)
WBC: 15.5 10*3/uL — AB (ref 4.0–10.5)

## 2016-06-19 LAB — BASIC METABOLIC PANEL
Anion gap: 6 (ref 5–15)
BUN: 11 mg/dL (ref 6–20)
CALCIUM: 7.3 mg/dL — AB (ref 8.9–10.3)
CO2: 27 mmol/L (ref 22–32)
CREATININE: 0.5 mg/dL — AB (ref 0.61–1.24)
Chloride: 110 mmol/L (ref 101–111)
Glucose, Bld: 132 mg/dL — ABNORMAL HIGH (ref 65–99)
Potassium: 3.1 mmol/L — ABNORMAL LOW (ref 3.5–5.1)
SODIUM: 143 mmol/L (ref 135–145)

## 2016-06-19 LAB — LACTIC ACID, PLASMA
LACTIC ACID, VENOUS: 1.4 mmol/L (ref 0.5–1.9)
LACTIC ACID, VENOUS: 1.4 mmol/L (ref 0.5–1.9)

## 2016-06-19 LAB — MAGNESIUM: MAGNESIUM: 1.7 mg/dL (ref 1.7–2.4)

## 2016-06-19 MED ORDER — MAGNESIUM SULFATE 2 GM/50ML IV SOLN
2.0000 g | Freq: Once | INTRAVENOUS | Status: AC
  Administered 2016-06-19: 2 g via INTRAVENOUS
  Filled 2016-06-19: qty 50

## 2016-06-19 MED ORDER — POTASSIUM CHLORIDE 20 MEQ/15ML (10%) PO SOLN
40.0000 meq | Freq: Once | ORAL | Status: AC
  Administered 2016-06-19: 40 meq via ORAL
  Filled 2016-06-19: qty 30

## 2016-06-19 MED ORDER — POTASSIUM CHLORIDE 2 MEQ/ML IV SOLN
INTRAVENOUS | Status: DC
  Administered 2016-06-19 – 2016-06-20 (×2): via INTRAVENOUS
  Filled 2016-06-19 (×7): qty 1000

## 2016-06-19 NOTE — Progress Notes (Signed)
Pharmacy Antibiotic Note  Roy English is a 65 y.o. male admitted on 06/12/2016 with Febrile neutropenia .  Pharmacy has been consulted for cefepime dosing.  06/19/2016:  Day #7 Cefepime, day#4 Flagyl  Neutropenia resolved  Afebrile  Renal fxn stable  Plan:  Continue Cefepime 2gm iv q8h  Continue metronidazole for anaerobic coverage  Duration of therapy per MD- consider d/c if clinically improved once completes today's doses  No further dose adjustments anticipated- pharmacy to sign off.  Please re-consult if needed.   Height: 5\' 9"  (175.3 cm) Weight: 152 lb 11.2 oz (69.3 kg) IBW/kg (Calculated) : 70.7  Temp (24hrs), Avg:98.2 F (36.8 C), Min:97.6 F (36.4 C), Max:98.9 F (37.2 C)   Recent Labs Lab 06/13/16 0545 06/13/16 2003 06/14/16 0432 06/15/16 0337 06/17/16 0343 06/18/16 0519 06/18/16 0805 06/18/16 1107 06/18/16 1440 06/19/16 0512 06/19/16 0803  WBC  --   --  5.4 6.9 8.6 10.0  --   --   --  15.5*  --   CREATININE  --   --  0.54* 0.48* 0.55* 0.48*  --   --  0.51* 0.50*  --   LATICACIDVEN 2.6* 1.5  --   --   --   --  1.2 2.3*  --   --  1.4    Estimated Creatinine Clearance: 91.4 mL/min (by C-G formula based on SCr of 0.5 mg/dL (L)).    Allergies  Allergen Reactions  . Penicillins Other (See Comments)    Reaction:  Unknown  Has patient had a PCN reaction causing immediate rash, facial/tongue/throat swelling, SOB or lightheadedness with hypotension: Unsure Has patient had a PCN reaction causing severe rash involving mucus membranes or skin necrosis: Unsure Has patient had a PCN reaction that required hospitalization Unsure Has patient had a PCN reaction occurring within the last 10 years: No If all of the above answers are "NO", then may proceed with Cephalosporin use.    Antimicrobials this admission:  Vancomycin 06/13/2016 >> 11/13 Cefepime 06/13/2016 >>  Flagyl 11/15 >>  Dose adjustments this admission:   Microbiology results:  11/12 BCx:  NGTD 11/12 UCx: NG  11/12 MRSA PCR: neg 11/12 C. Diff: neg/neg  Thank you for allowing pharmacy to be a part of this patient's care.  Doreene Eland, PharmD, BCPS.   Pager: [phone] 06/19/2016 12:40 PM

## 2016-06-19 NOTE — Progress Notes (Signed)
[facility] PROGRESS NOTE    Progress Note  Roy English  [id number] DOB: 04-08-1951 DOA: 06/12/2016 PCP: Golden Pop, MD   Brief Narrative:   Roy English is an 65 y.o. 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 Patient woke up on the morning of admission starting having emesis 5-6 loose stools for the past 2 days. In the ED was found to have hypotensive post hydromorphone so he was started on normal saline, Abdominal ultrasound was done that showed minimal ascites,CT scan of the abdomen and pelvis showed right-sided colitis. He was aggressively fluid hydrated through the sepsis protocol and lactic acidosis resolved. Repeat CT abdomen due to failure to improve with abdominal pain performed 11/15 suggestive of partial SBO due to small bowel wall thickening in the setting of opioid analgesics. Resolved with BM 11/15. He has ambulated, and continues to have abdominal pain controlled with po dilaudid. While tolerating fluids, he continues to have inadequate po intake limiting his ability to maintain hydration independently and reliably. IV antibiotics and IV fluids continuing. 11/18: abdomen more taut, suspect accumulation of ascites sufficient for paracentesis, both therapeutic and diagnostic. Will order this.  Assessment/Plan:   Sepsis and febrile neutropenia: Unlikely cholangitis as his LFTs are within normal. C. Dif PCR negative, flu negative. Lactate 2.6 > 1.5 > 1.2.  - Last febrile 11/14 PM - Plan to complete cefepime/flagyl 11/19 - Continue cefepime (started 11/11), added flagyl 11/15 for anaerobic coverage (e.g. SBP). 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 Benay Spice, s/p EUS with Bx Feb 2017 at [facility]. CT abdomen/pelvis 06/12/2016 consistent with progression of hepatic metastases, decrease in the pancreas head mass, progressive ascites, irregular wall thickening at the small bowel in the right abdomen - Will follow up with Dr. Benay Spice 11/28 to discuss treatment options including further chemo versus supportive therapies. - Paracentesis was not attempted earlier in the admission due to inadequate fluid pockets, but size has increased, so we will reattempt this for diagnosis (confirmation of malignant ascites) and therapy. - Symptoms uncontrolled, I have discussed consult to palliative care and they are amenable today. - Pain controlled on home po dilaudid.  Sinus tachycardia: Picked up on telemetry as AFib w/RVR, no history of this. ST with PVCs confirmed on ECG without ST depressions/elevations. Sequela of sepsis. Echo: preserved EF, G1DD, no WMA. No significant cardiac history. - Telemetry shows largely resolution of intermittent PVC's and  no more limited runs of VTach s/p Mg and K normalization.  - Optimizing electrolytes - Will discuss with cardiology if persistent following electrolyte repletion. Currently hemodynamically stable  Partial SBO: Repeat CT abdomen due to failure to improve with abdominal pain performed 11/15 suggestive of partial SBO due to small bowel wall thickening in the setting of opioid analgesics. Resolved with BM 11/15. - General surgery consulted and signed off after resolution.  - Advancing diet slowly - IV PPI - Zofran prn - OOB, PT/OT  Idiopathic interstitial lung disease: Chronic, stable. - Continue supplemental oxygen prn  Watery diarrhea: Resolved. CDiff PCR negative.   Hypokalemia: Recurrent, related to reduced po intake. - Repleted this AM - Recheck with Mg in PM   Insomnia: Chronic, stable.  - Restarted remeron with improvement  DVT prophylaxis: lovenox Family  Communication: Wife and  sister at bedside Disposition Plan/Barrier to D/C: Home with HH-PT, OT, 3 in 1 and rolling walker ordered. Only barrier is his inability to maintain hydration by mouth. Code Status: Full  IV Access:    Peripheral IV  Procedures and diagnostic studies:   No results found. Medical Consultants:    General surgery 11/15 for partial SBO.  Oncology, Dr. Benay Spice  Palliative care medicine  Anti-Infectives:   Cefepime 06/12/2016 >> 11/19 Flagyl 06/16/2016 >> 11/19  Subjective:   Pain controlled with po dilaudid but pt reporting delirium overnight. Had BM, ambulating, just not able to force himself to take much by mouth. No vomiting. Abdominal pain "tightness" worse. Says with his pain and overall condition he wouldn't want to continue living like this.  Objective:    Vitals:   06/18/16 1400 06/18/16 2043 06/19/16 0409 06/19/16 1323  BP: 98/63 (!) 101/59 (!) 105/59 103/67  Pulse: 87 97 96 90  Resp: 16 20 20 20   Temp: 97.6 F (36.4 C) 98.9 F (37.2 C) 98.2 F (36.8 C) 98.5 F (36.9 C)  TempSrc: Oral Oral Oral Oral  SpO2: 94% 93% 94% 93%  Weight:      Height:        Intake/Output Summary (Last 24 hours) at 06/19/16 1420 Last data filed at 06/19/16 0600  Gross per 24 hour  Intake             1980 ml  Output              800 ml  Net             1180 ml   Filed Weights   06/12/16 2150 06/13/16 0239  Weight: 63.5 kg (140 lb) 69.3 kg (152 lb 11.2 oz)    Exam: General exam: Chronically ill-appearing, tired-appearing male in no acute distress. Respiratory system: Good air movement and clear to auscultation. Cardiovascular system: S1 & S2 heard, RRR. No JVD, murmurs, rubs, gallops or clicks.  Gastrointestinal system: General right-side tenderness to palpation worst in RLQ with guarding, no rebound. RLQ mass vs. bubble palpated. Mildly distended, no fluid wave. Hypoactive bowel sounds.  Neuro: No focal deficits, but diffusely weak.  Extremities: No pedal edema. Skin:  No rashes, lesions or ulcers Psychiatry: Judgement and insight appear normal. Mood & affect appropriate.    Data Reviewed:    Labs: Basic Metabolic Panel:  Recent Labs Lab 06/13/16 0218  06/14/16 0432 06/15/16 0337 06/17/16 0343 06/18/16 0519 06/18/16 1440 06/19/16 0512  NA  --   < > 137 136 139 139 140 143  K  --   < > 4.3 3.9 2.9* 2.5* 3.7 3.1*  CL  --   < > 111 109 107 107 109 110  CO2  --   < > 22 24 24 26 28 27   GLUCOSE  --   < > 88 75 77 124* 166* 132*  BUN  --   < > 12 13 11 13 12 11   CREATININE  --   < > 0.54* 0.48* 0.55* 0.48* 0.51* 0.50*  CALCIUM  --   < > 7.8* 7.8* 7.5* 7.4* 7.3* 7.3*  MG 1.6*  --  1.9  --   --   --  1.7 1.7  < > = values in this interval not displayed. GFR Estimated Creatinine Clearance: 91.4 mL/min (by C-G formula based on SCr of 0.5 mg/dL (L)). Liver Function Tests:  Recent Labs Lab 06/12/16 2239 06/13/16 0535 06/14/16 0432 06/15/16  [id number] 06/18/16 1440  AST 40 28 20 16 20   ALT 18 13* 13* 11* 9*  ALKPHOS 189* 128* 97 75 87  BILITOT 0.7 0.4 0.7 0.6 0.5  PROT 6.6 5.2* 5.3* 5.0* 4.9*  ALBUMIN 3.3* 2.4* 2.3* 2.1* 1.9*    Recent Labs Lab 06/12/16 2239  LIPASE 12   CBC:  Recent Labs Lab 06/14/16 0432 06/15/16 0337 06/17/16 0343 06/18/16 0519 06/19/16 0512  WBC 5.4 6.9 8.6 10.0 15.5*  NEUTROABS 4.8 6.0 7.2 8.2* 13.4*  HGB 11.2* 10.1* 10.7* 11.4* 11.4*  HCT 34.7* 31.7* 32.0* 33.7* 34.5*  MCV 88.3 87.8 84.7 85.1 86.7  PLT 115* 104* 117* 143* 178   Sepsis Labs:  Recent Labs Lab 06/15/16 0337 06/17/16 0343 06/18/16 0519 06/18/16 0805 06/18/16 1107 06/19/16 0512 06/19/16 0803 06/19/16 1300  WBC 6.9 8.6 10.0  --   --  15.5*  --   --   LATICACIDVEN  --   --   --  1.2 2.3*  --  1.4 1.4   Microbiology Recent Results (from the past 240 hour(s))  Blood culture (routine x 2)     Status: None   Collection Time: 06/12/16 10:40 PM  Result Value Ref Range Status   Specimen Description BLOOD LEFT ARM  Final   Special  Requests BOTTLES DRAWN AEROBIC AND ANAEROBIC 6ML  Final   Culture   Final    NO GROWTH 5 DAYS Performed at [facility]    Report Status 06/17/2016 FINAL  Final  Blood culture (routine x 2)     Status: None   Collection Time: 06/12/16 10:41 PM  Result Value Ref Range Status   Specimen Description BLOOD LEFT ANTECUBITAL  Final   Special Requests BOTTLES DRAWN AEROBIC AND ANAEROBIC 6ML  Final   Culture   Final    NO GROWTH 5 DAYS Performed at [facility]    Report Status 06/17/2016 FINAL  Final  MRSA PCR Screening     Status: None   Collection Time: 06/13/16  4:00 AM  Result Value Ref Range Status   MRSA by PCR NEGATIVE NEGATIVE Final    Comment:        The GeneXpert MRSA Assay (FDA approved for NASAL specimens only), is one component of a comprehensive MRSA colonization surveillance program. It is not intended to diagnose MRSA infection nor to guide or monitor treatment for MRSA infections.   Urine culture     Status: None   Collection Time: 06/13/16 12:49 PM  Result Value Ref Range Status   Specimen Description URINE, CLEAN CATCH  Final   Special Requests NONE  Final   Culture NO GROWTH Performed at [facility]   Final   Report Status 06/14/2016 FINAL  Final  C difficile quick scan w PCR reflex     Status: None   Collection Time: 06/13/16 12:49 PM  Result Value Ref Range Status   C Diff antigen NEGATIVE NEGATIVE Final   C Diff toxin NEGATIVE NEGATIVE Final   C Diff interpretation No C. difficile detected.  Final   Medications:   . ceFEPime (MAXIPIME) IV  2 g Intravenous Q8H  . lipase/protease/amylase  12,000 Units Oral TID WC  . mouth rinse  15 mL Mouth Rinse BID  . metronidazole  500 mg Intravenous Q8H  . mirtazapine  15 mg Oral QHS  . pantoprazole (PROTONIX) IV  40 mg Intravenous Q24H  . polyethylene glycol  17 g Oral Daily  . senna-docusate  1 tablet  Oral Daily  . sodium chloride  500 mL Intravenous Once  . sodium chloride flush  3  mL Intravenous Q12H  . tamsulosin  0.4 mg Oral QPC breakfast   Continuous Infusions: . sodium chloride 0.45 % 1,000 mL with potassium chloride 20 mEq infusion 75 mL/hr at 06/19/16 0900   Time spent:25 min   LOS: 6 days   Vance Gather  [facility] Pager [phone]  *Please refer to [email or web address], password [email or web address] to get updated schedule on who will round on this patient, as hospitalists switch teams weekly. If 7PM-7AM, please contact night-coverage at [email or web address], password [email or web address] for any overnight needs.  06/19/2016, 2:20 PM

## 2016-06-20 ENCOUNTER — Inpatient Hospital Stay (HOSPITAL_COMMUNITY): Payer: 59

## 2016-06-20 LAB — BODY FLUID CELL COUNT WITH DIFFERENTIAL
Eos, Fluid: 1 %
LYMPHS FL: 13 %
Monocyte-Macrophage-Serous Fluid: 21 % — ABNORMAL LOW (ref 50–90)
Neutrophil Count, Fluid: 65 % — ABNORMAL HIGH (ref 0–25)
Other Cells, Fluid: 2 %
Total Nucleated Cell Count, Fluid: 551 cu mm (ref 0–1000)

## 2016-06-20 LAB — GLUCOSE, SEROUS FLUID: GLUCOSE FL: 104 mg/dL

## 2016-06-20 LAB — BASIC METABOLIC PANEL
Anion gap: 5 (ref 5–15)
BUN: 9 mg/dL (ref 6–20)
CALCIUM: 7.1 mg/dL — AB (ref 8.9–10.3)
CO2: 27 mmol/L (ref 22–32)
CREATININE: 0.5 mg/dL — AB (ref 0.61–1.24)
Chloride: 107 mmol/L (ref 101–111)
GFR calc Af Amer: >60 mL/min (ref >60)
GFR calc non Af Amer: >60 mL/min (ref >60)
GLUCOSE: 107 mg/dL — AB (ref 65–99)
Potassium: 3.8 mmol/L (ref 3.5–5.1)
Sodium: 139 mmol/L (ref 135–145)

## 2016-06-20 LAB — GRAM STAIN

## 2016-06-20 LAB — LACTATE DEHYDROGENASE, PLEURAL OR PERITONEAL FLUID: LD, Fluid: 133 U/L — ABNORMAL HIGH (ref 3–23)

## 2016-06-20 LAB — CREATININE, SERUM
CREATININE: 0.47 mg/dL — AB (ref 0.61–1.24)
GFR calc Af Amer: >60 mL/min (ref >60)

## 2016-06-20 LAB — PROTEIN, BODY FLUID

## 2016-06-20 LAB — PROTEIN, TOTAL: TOTAL PROTEIN: 5 g/dL — AB (ref 6.5–8.1)

## 2016-06-20 MED ORDER — HYDROMORPHONE HCL 1 MG/ML IJ SOLN
1.0000 mg | INTRAMUSCULAR | Status: DC | PRN
  Administered 2016-06-20 – 2016-06-21 (×4): 1 mg via INTRAVENOUS
  Filled 2016-06-20 (×4): qty 1

## 2016-06-20 MED ORDER — HYDROMORPHONE HCL 2 MG PO TABS
2.0000 mg | ORAL_TABLET | ORAL | Status: DC
  Administered 2016-06-20 – 2016-06-21 (×6): 4 mg via ORAL
  Filled 2016-06-20 (×6): qty 2

## 2016-06-20 MED ORDER — PANTOPRAZOLE SODIUM 40 MG PO TBEC
40.0000 mg | DELAYED_RELEASE_TABLET | Freq: Every day | ORAL | Status: DC
  Administered 2016-06-20 – 2016-06-21 (×2): 40 mg via ORAL
  Filled 2016-06-20 (×2): qty 1

## 2016-06-20 MED ORDER — ENSURE ENLIVE PO LIQD
237.0000 mL | Freq: Two times a day (BID) | ORAL | Status: DC
  Administered 2016-06-20: 237 mL via ORAL

## 2016-06-20 MED ORDER — ENOXAPARIN SODIUM 40 MG/0.4ML ~~LOC~~ SOLN
40.0000 mg | Freq: Every day | SUBCUTANEOUS | Status: DC
  Administered 2016-06-20: 40 mg via SUBCUTANEOUS
  Filled 2016-06-20 (×2): qty 0.4

## 2016-06-20 NOTE — Procedures (Signed)
Successful US guided paracentesis from LLQ.  Yielded 730 mL of clear yellow fluid.  No immediate complications.  Pt tolerated well.   Specimen was sent for labs.  Ascencion Dike PA-C 06/20/2016 9:35 AM

## 2016-06-20 NOTE — Progress Notes (Signed)
[facility] PROGRESS NOTE    Progress Note  Roy English  [id number] DOB: [birth year] DOA: 06/12/2016 PCP: Golden Pop, MD   Brief Narrative:   Roy English is an 65 y.o. 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 Patient woke up on the morning of admission starting having emesis 5-6 loose stools for the past 2 days. In the ED was found to have hypotensive post hydromorphone so he was started on normal saline, Abdominal ultrasound was done that showed minimal ascites,CT scan of the abdomen and pelvis showed right-sided colitis. He was aggressively fluid hydrated through the sepsis protocol and lactic acidosis resolved. Repeat CT abdomen due to failure to improve with abdominal pain performed 11/15 suggestive of partial SBO due to small bowel wall thickening in the setting of opioid analgesics. Resolved with BM 11/15. He has ambulated, and continues to have abdominal pain controlled with po dilaudid. While tolerating fluids, he continues to have inadequate po intake limiting his ability to maintain hydration independently and reliably. IV antibiotics and IV fluids continuing. 11/18: abdomen more taut, suspected accumulation of ascites. Therapeutic and diagnostic paracentesis performed 11/19 with 730cc out without appreciable improvement in pain. Palliative care planning family visit 11/20 and dilaudid po has been scheduled with IV breakthrough doses prn.   Assessment/Plan:   Sepsis and febrile neutropenia: Unlikely cholangitis as his LFTs are within normal. C. Dif PCR negative, flu negative. Lactate 2.6 > 1.5 > 1.2.  - Last febrile 11/14 PM - Plan to complete cefepime/flagyl 11/19 - Continue cefepime (started 11/11), added flagyl 11/15 for anaerobic coverage (e.g. SBP). 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 Benay Spice, s/p EUS with Bx Feb 2017 at [facility]. CT abdomen/pelvis 06/12/2016 consistent with progression of hepatic metastases, decrease in the pancreas head mass, progressive ascites, irregular wall thickening at the small bowel in the right abdomen - Will follow up with Dr. Benay Spice 11/28 to discuss treatment options including further chemo versus supportive therapies. - Paracentesis 11/19 without relief of significant abd pain - Schedule dilaudid 4mg  po q4h + 1mg  IV q2h prn breakthrough.  Sinus tachycardia: Picked up on telemetry as AFib w/RVR, no history of this. ST with PVCs confirmed on ECG without ST depressions/elevations. Sequela of sepsis. Echo: preserved EF, G1DD, no WMA. No significant cardiac history. - Telemetry shows largely resolution of intermittent PVC's and  no more limited runs of VTach s/p Mg and K normalization.  - Optimizing electrolytes - Will discuss with cardiology if persistent following electrolyte repletion. Currently hemodynamically stable  Partial SBO: Repeat CT abdomen due to failure to improve with abdominal pain performed 11/15 suggestive of partial SBO due to small bowel wall thickening in the setting of opioid analgesics. Resolved with BM 11/15. - General surgery consulted and signed off after resolution.  - Diet as tolerated - IV PPI - Zofran prn - OOB, PT/OT  Idiopathic interstitial lung disease: Chronic, stable. - Continue supplemental oxygen prn  Watery diarrhea: Resolved. CDiff PCR negative.   Hypokalemia: Recurrent, related to reduced po intake. - Resolved s/p repletion. Will give lab holiday 11/20.  Insomnia: Chronic, stable.  - Restarted remeron with improvement  DVT prophylaxis: lovenox Family Communication: Sister at bedside Disposition Plan/Barrier to D/C: Pending family  discussion with palliative care and oncology 11/20. Code Status: Full  IV Access:    Peripheral IV  Procedures and diagnostic studies:   US Paracentesis  Result Date: 06/20/2016 INDICATION: Pancreatic cancer. Abdominal distention. Ascites. Request diagnostic and therapeutic paracentesis. EXAM: ULTRASOUND GUIDED LEFT LOWER QUADRANT PARACENTESIS MEDICATIONS: None. COMPLICATIONS: None immediate. PROCEDURE: Informed written consent was obtained from the patient after a discussion of the risks, benefits and alternatives to treatment. A timeout was performed prior to the initiation of the procedure. Initial ultrasound scanning demonstrates a small volume of ascites within the left lower abdominal quadrant. The left lower abdomen was prepped and draped in the usual sterile fashion. 1% lidocaine with epinephrine was used for local anesthesia. Following this, a Safe-T-Centesis catheter was introduced. An ultrasound image was saved for documentation purposes. The paracentesis was performed. The catheter was removed and a dressing was applied. The patient tolerated the procedure well without immediate post procedural complication. FINDINGS: A total of approximately 730 mL of clear yellow fluid was removed. Samples were sent to the laboratory as requested by the clinical team. IMPRESSION: Successful ultrasound-guided paracentesis yielding 730 mL of peritoneal fluid. Read by: Ascencion Dike PA-C Electronically Signed   By: Corrie Mckusick D.O.   On: 06/20/2016 09:37   Medical Consultants:    General surgery 11/15 for partial SBO.  Oncology, Dr. Benay Spice  Palliative care medicine, Dr. Rowe Pavy  Anti-Infectives:   Cefepime 06/12/2016 >> 11/19 Flagyl 06/16/2016 >> 11/19  Subjective:   Pain not controlled with po dilaudid, not improved with paracentesis. Drank some of 1 ensure, no BM.  Objective:    Vitals:   06/20/16 0617 06/20/16 0850 06/20/16 0900 06/20/16 1504  BP: 105/71 103/71 95/61 (!) 101/55    Pulse: 95   (!) 102  Resp: 20   19  Temp: 98.1 F (36.7 C)   98.1 F (36.7 C)  TempSrc: Oral   Oral  SpO2: 93%   91%  Weight:      Height:        Intake/Output Summary (Last 24 hours) at 06/20/16 1507 Last data filed at 06/20/16 1500  Gross per 24 hour  Intake               60 ml  Output              100 ml  Net              -40 ml   Filed Weights   06/12/16 2150 06/13/16 0239  Weight: 63.5 kg (140 lb) 69.3 kg (152 lb 11.2 oz)    Exam: General exam: Chronically ill-appearing, tired-appearing male in no acute distress. Respiratory system: Good air movement and clear to auscultation. Cardiovascular system: S1 & S2 heard, RRR. No JVD, murmurs, rubs, gallops or clicks.  Gastrointestinal system: General right-side tenderness to palpation worst in RLQ with guarding, no rebound. RLQ mass vs. bubble palpated. Distended. Hypoactive bowel sounds.  Neuro: No focal deficits, but diffusely weak.  Extremities: No pedal edema. Skin: No rashes, lesions or ulcers Psychiatry: Judgement and insight appear normal. Mood depressed & affect appropriate.   Data Reviewed:    Labs: Basic Metabolic Panel:  Recent Labs Lab 06/14/16 0432  06/17/16 0343 06/18/16 0519 06/18/16 1440 06/19/16 0512 06/20/16 0501 06/20/16 0504  NA 137  < > 139 139 140 143 139  --   K 4.3  < > 2.9* 2.5* 3.7 3.1* 3.8  --   CL 111  < > 107 107 109 110  107  --   CO2 22  < > 24 26 28 27 27   --   GLUCOSE 88  < > 77 124* 166* 132* 107*  --   BUN 12  < > 11 13 12 11 9   --   CREATININE 0.54*  < > 0.55* 0.48* 0.51* 0.50* 0.50* 0.47*  CALCIUM 7.8*  < > 7.5* 7.4* 7.3* 7.3* 7.1*  --   MG 1.9  --   --   --  1.7 1.7  --   --   < > = values in this interval not displayed. GFR Estimated Creatinine Clearance: 91.4 mL/min (by C-G formula based on SCr of 0.47 mg/dL (L)). Liver Function Tests:  Recent Labs Lab 06/14/16 0432 06/15/16 0337 06/18/16 1440 06/20/16 0810  AST 20 16 20   --   ALT 13* 11* 9*  --   ALKPHOS 97  75 87  --   BILITOT 0.7 0.6 0.5  --   PROT 5.3* 5.0* 4.9* 5.0*  ALBUMIN 2.3* 2.1* 1.9*  --    No results for input(s): LIPASE, AMYLASE in the last 168 hours. CBC:  Recent Labs Lab 06/14/16 0432 06/15/16 0337 06/17/16 0343 06/18/16 0519 06/19/16 0512  WBC 5.4 6.9 8.6 10.0 15.5*  NEUTROABS 4.8 6.0 7.2 8.2* 13.4*  HGB 11.2* 10.1* 10.7* 11.4* 11.4*  HCT 34.7* 31.7* 32.0* 33.7* 34.5*  MCV 88.3 87.8 84.7 85.1 86.7  PLT 115* 104* 117* 143* 178   Sepsis Labs:  Recent Labs Lab 06/15/16 0337 06/17/16 0343 06/18/16 0519 06/18/16 0805 06/18/16 1107 06/19/16 0512 06/19/16 0803 06/19/16 1300  WBC 6.9 8.6 10.0  --   --  15.5*  --   --   LATICACIDVEN  --   --   --  1.2 2.3*  --  1.4 1.4   Microbiology Recent Results (from the past 240 hour(s))  Blood culture (routine x 2)     Status: None   Collection Time: 06/12/16 10:40 PM  Result Value Ref Range Status   Specimen Description BLOOD LEFT ARM  Final   Special Requests BOTTLES DRAWN AEROBIC AND ANAEROBIC 6ML  Final   Culture   Final    NO GROWTH 5 DAYS Performed at [facility]    Report Status 06/17/2016 FINAL  Final  Blood culture (routine x 2)     Status: None   Collection Time: 06/12/16 10:41 PM  Result Value Ref Range Status   Specimen Description BLOOD LEFT ANTECUBITAL  Final   Special Requests BOTTLES DRAWN AEROBIC AND ANAEROBIC 6ML  Final   Culture   Final    NO GROWTH 5 DAYS Performed at [facility]    Report Status 06/17/2016 FINAL  Final  MRSA PCR Screening     Status: None   Collection Time: 06/13/16  4:00 AM  Result Value Ref Range Status   MRSA by PCR NEGATIVE NEGATIVE Final    Comment:        The GeneXpert MRSA Assay (FDA approved for NASAL specimens only), is one component of a comprehensive MRSA colonization surveillance program. It is not intended to diagnose MRSA infection nor to guide or monitor treatment for MRSA infections.   Urine culture     Status: None   Collection  Time: 06/13/16 12:49 PM  Result Value Ref Range Status   Specimen Description URINE, CLEAN CATCH  Final   Special Requests NONE  Final   Culture NO GROWTH Performed at [facility]   Final  Report Status 06/14/2016 FINAL  Final  C difficile quick scan w PCR reflex     Status: None   Collection Time: 06/13/16 12:49 PM  Result Value Ref Range Status   C Diff antigen NEGATIVE NEGATIVE Final   C Diff toxin NEGATIVE NEGATIVE Final   C Diff interpretation No C. difficile detected.  Final   Medications:   . feeding supplement (ENSURE ENLIVE)  237 mL Oral BID BM  . lipase/protease/amylase  12,000 Units Oral TID WC  . mouth rinse  15 mL Mouth Rinse BID  . metronidazole  500 mg Intravenous Q8H  . mirtazapine  15 mg Oral QHS  . pantoprazole (PROTONIX) IV  40 mg Intravenous Q24H  . polyethylene glycol  17 g Oral Daily  . senna-docusate  1 tablet Oral Daily  . sodium chloride  500 mL Intravenous Once  . sodium chloride flush  3 mL Intravenous Q12H  . tamsulosin  0.4 mg Oral QPC breakfast   Continuous Infusions: . sodium chloride 0.45 % 1,000 mL with potassium chloride 20 mEq infusion 75 mL/hr at 06/20/16 0126   Time spent:25 min   LOS: 7 days   Vance Gather  [facility] Pager [phone]  *Please refer to [email or web address], password [email or web address] to get updated schedule on who will round on this patient, as hospitalists switch teams weekly. If 7PM-7AM, please contact night-coverage at [email or web address], password [email or web address] for any overnight needs.  06/20/2016, 3:07 PM

## 2016-06-20 NOTE — Consult Note (Signed)
Consultation Note Date: 06/20/2016   Patient Name: Roy English  DOB: 07/03/1951  MRN: [id number]  Age / Sex: 65 y.o., male  PCP: Guadalupe Maple, MD Referring Physician: Patrecia Pour, MD  Reason for Consultation: Establishing goals of care  HPI/Patient Profile: 65 y.o. male    admitted on 06/12/2016   Roy English is an 65 y.o. 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 Patient woke up on the morning of admission starting having emesis 5-6 loose stools for the past 2 days. In the ED was found to have hypotensive post hydromorphone so he was started on normal saline, Abdominal ultrasound was done that showed minimal ascites,CT scan of the abdomen and pelvis showed right-sided colitis. He was aggressively fluid hydrated through the sepsis protocol and lactic acidosis resolved. Repeat CT abdomen due to failure to improve with abdominal pain performed 11/15 suggestive of partial SBO due to small bowel wall thickening in the setting of opioid analgesics. Resolved with BM 11/15. He has ambulated, and continues to have abdominal pain controlled with po dilaudid. While tolerating fluids, he continues to have inadequate po intake limiting his ability to maintain hydration independently and reliably. IV antibiotics and IV fluids continuing. 11/18: abdomen more taut, suspect accumulation of ascites sufficient for paracentesis, both therapeutic and diagnostic. 11/19: patient underwent paracentesis, removal of 730 cc of clear ascitic fluid, studies pending.    Clinical Assessment and Goals of Care: A palliative consultation has been requested for goals of care discussions and symptom management.   Patient is resting in bed, he  appears weak. He has just returned from the above mentioned paracentesis.   Discussed with wife and sister present at the bedside extensively this morning. Life review performed, patient is a [place], he and his wife live in [place], [place], [place]. Patient has 2 sons, one son is now deceased. Patient has a sister who is a retired Software engineer, she lives in [place], [place] and has been extensively involved in the patient's care.   The patient has had a gradual progressive decline for the past 1-2 months. He is reported to have started saying that he did not want to live this way, he has extensive symptom burden, he is getting weaker. Dr Benay Spice is his oncologist, there are plans for him to meet Dr Benay Spice in his office on 06-29-16 for further discussions.   I introduced palliative care as follows: Palliative medicine is specialized medical care for people living with serious illness. It focuses on providing relief from the symptoms and stress of a serious illness. The goal is to improve quality of life for both the patient and the family.   For now, the patient is comfortable on the PO Dilaudid, it is noted that maintaining awake ness/ alertness is very important to the patient, he has not wanted high  doses of opioids thus far.    See recommendations below, wife has now gone to the patient's sister's house to rest, we will follow up in am.   HCPOA  wife and sister.   SUMMARY OF RECOMMENDATIONS    Plan for family meeting in am on 06-21-16 when both wife and sister are present, patient just returned from paracentesis and is resting.  Brief discussions undertaken with both wife and sister about the patient's serious illness. We have discussed about DNR DNI, Hospice services, aggressive symptom management, etc.  Further recommendations to follow when patient is able to participate in discussions.  Thank you for the consult.   Code Status/Advance Care Planning:  Full  code    Symptom Management:    continue PO Dilaudid for now.   Palliative Prophylaxis:   Bowel Regimen   Psycho-social/Spiritual:   Desire for further Chaplaincy support:no  Additional Recommendations: Education on Hospice  Prognosis:   guarded  Discharge Planning: To Be Determined      Primary Diagnoses: Present on Admission: . Febrile neutropenia ([place]) . 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 Hypokalemia . Interstitial lung disease ([place]) . Sepsis due to undetermined organism ([place]) . AKI (acute kidney injury) ([place])   I have reviewed the medical record, interviewed the patient and family, and examined the patient. The following aspects are pertinent.  Past Medical History:  Diagnosis Date  . Actinic keratosis   . Arthritis   . 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 Dilated cbd, acquired   . ED (erectile dysfunction)   . Elevated LFTs   . Environmental allergies   . Idiopathic interstitial fibrosis of lung syndrome ([place])   . S/P radiation therapy 11/24/15 completed    pancreas 50.4Gy   Social History   Social History  . Marital status: Married    Spouse name: [name]  . Number of children: 2  . Years of education: N/A   Occupational History  . Data processing manager   Social History Main Topics  . Smoking status: Never Smoker  . Smokeless tobacco: Never Used  . Alcohol use 0.0 oz/week     Comment: 6 beer daily-heavy on the weekends  . Drug use: No  . Sexual activity: Not Currently   Other Topics Concern  . None   Social History Narrative   Married, wife [name]   [place] in [place] in [place]      Family History  Problem Relation Age of Onset  . Heart disease Mother   . Rheum arthritis Mother   . Cancer      both sides of the family, type unknown   Scheduled Meds: . feeding supplement (ENSURE ENLIVE)  237 mL Oral BID BM  .  lipase/protease/amylase  12,000 Units Oral TID WC  . mouth rinse  15 mL Mouth Rinse BID  . metronidazole  500 mg Intravenous Q8H  . mirtazapine  15 mg Oral QHS  . pantoprazole (PROTONIX) IV  40 mg Intravenous Q24H  . polyethylene glycol  17 g Oral Daily  . senna-docusate  1 tablet Oral Daily  . sodium chloride  500 mL Intravenous Once  . sodium chloride flush  3 mL Intravenous Q12H  . tamsulosin  0.4 mg Oral QPC breakfast   Continuous Infusions: . sodium chloride 0.45 % 1,000 mL with potassium chloride 20 mEq infusion 75 mL/hr at 06/20/16 0126   PRN Meds:.acetaminophen, bisacodyl, HYDROmorphone, iopamidol, ondansetron **OR** ondansetron (  ZOFRAN) IV, simethicone Medications Prior to Admission:  Prior to Admission medications   Medication Sig Start Date End Date Taking? Authorizing Provider  acetaminophen (TYLENOL) 500 MG tablet Take 1,000 mg by mouth every 6 (six) hours as needed for mild pain, moderate pain or headache.   Yes Historical Provider, MD  Bisacodyl (DULCOLAX PO) Take 1 tablet by mouth 2 (two) times daily.    Yes Historical Provider, MD  HYDROmorphone (DILAUDID) 2 MG tablet Take 1-2 tablets (2-4 mg total) by mouth every 4 (four) hours as needed for severe pain. 06/07/16  Yes Owens Shark, NP  lactose free nutrition (BOOST) LIQD Take 237 mLs by mouth 2 (two) times daily between meals.   Yes Historical Provider, MD  lidocaine-prilocaine (EMLA) cream Apply 1 application topically as needed (prior to accessing port).   Yes Historical Provider, MD  LORazepam (ATIVAN) 0.5 MG tablet TAKE 1 TABLET BY MOUTH EVERY 4 HOURS AS NEEDED FOR ANXIETY/NAUSEA 05/17/16  Yes Owens Shark, NP  mirtazapine (REMERON) 15 MG tablet Take 1 tablet (15 mg total) by mouth at bedtime. 04/23/16  Yes Owens Shark, NP  omeprazole (PRILOSEC) 20 MG capsule Take 20 mg by mouth 2 (two) times daily.    Yes Historical Provider, MD  Pancrelipase, Lip-Prot-Amyl, (CREON) 3000-9500 units CPEP Take 3 capsules by mouth 3  (three) times daily with meals. 04/08/16  Yes Historical Provider, MD  polyethylene glycol (MIRALAX / GLYCOLAX) packet Take 17 g by mouth daily.    Yes Historical Provider, MD  prochlorperazine (COMPAZINE) 10 MG tablet Take 10 mg by mouth every 6 (six) hours as needed for nausea or vomiting.   Yes Historical Provider, MD  ondansetron (ZOFRAN) 8 MG tablet Take 1 tablet (8 mg total) by mouth every 6 (six) hours as needed for nausea or vomiting. 06/18/16   Ladell Pier, MD   Allergies  Allergen Reactions  . Penicillins Other (See Comments)    Reaction:  Unknown  Has patient had a PCN reaction causing immediate rash, facial/tongue/throat swelling, SOB or lightheadedness with hypotension: Unsure Has patient had a PCN reaction causing severe rash involving mucus membranes or skin necrosis: Unsure Has patient had a PCN reaction that required hospitalization Unsure Has patient had a PCN reaction occurring within the last 10 years: No If all of the above answers are "NO", then may proceed with Cephalosporin use.   Review of Systems + for abdominal pain.   Physical Exam Weak elderly appearing Thin cachectic S1 S2 Shallow clear Abdomen less distended Trace edema Awake but not alert, resting in bed after recent paracentesis  Vital Signs: BP 95/61 (BP Location: Left Arm)   Pulse 95   Temp 98.1 F (36.7 C) (Oral)   Resp 20   Ht 5\' 9"  (1.753 m)   Wt 69.3 kg (152 lb 11.2 oz)   SpO2 93%   BMI 22.55 kg/m  Pain Assessment: 0-10 POSS *See Group Information*: S-Acceptable,Sleep, easy to arouse Pain Score: 1    SpO2: SpO2: 93 % O2 Device:SpO2: 93 % O2 Flow Rate: .O2 Flow Rate (L/min): 2 L/min  IO: Intake/output summary:  Intake/Output Summary (Last 24 hours) at 06/20/16 1053 Last data filed at 06/19/16 2156  Gross per 24 hour  Intake              240 ml  Output              100 ml  Net  140 ml    LBM: Last BM Date: 06/17/16 Baseline Weight: Weight: 63.5 kg (140  lb) Most recent weight: Weight: 69.3 kg (152 lb 11.2 oz)     Palliative Assessment/Data:   Flowsheet Rows   Flowsheet Row Most Recent Value  Intake Tab  Referral Department  Hospitalist  Unit at Time of Referral  Oncology Unit  Palliative Care Primary Diagnosis  Cancer  Palliative Care Type  New Palliative care  Reason for referral  Non-pain Symptom, Clarify Goals of Care, Counsel Regarding Hospice, End of Life Care Assistance, Advance Care Planning, Psychosocial or Spiritual support, Pain  Date first seen by Palliative Care  06/20/16  Clinical Assessment  Palliative Performance Scale Score  30%  Pain Max last 24 hours  6  Pain Min Last 24 hours  4  Dyspnea Max Last 24 Hours  6  Dyspnea Min Last 24 hours  4  Nausea Max Last 24 Hours  6  Nausea Min Last 24 Hours  4  Psychosocial & Spiritual Assessment  Palliative Care Outcomes  Patient/Family meeting held?  Yes  Who was at the meeting?  patient wife sister  Palliative Care Outcomes  Clarified goals of care      Time In:  9 Time Out:  10.10 Time Total:  70 min  Greater than 50%  of this time was spent counseling and coordinating care related to the above assessment and plan.  Signed by: Loistine Chance, MD  [phone]  Please contact Palliative Medicine Team phone at [phone] for questions and concerns.  For individual provider: See Shea Evans

## 2016-06-20 NOTE — Progress Notes (Signed)
PT Cancellation Note  Patient Details Name: Roy English MRN: [id number] DOB: May 14, 1951   Cancelled Treatment:    Reason Eval/Treat Not Completed: Pain limiting ability to participate, S/P paracentesis today.    Claretha Cooper 06/20/2016, 3:48 PM Tresa Endo PT [phone]

## 2016-06-21 ENCOUNTER — Ambulatory Visit: Payer: 59 | Admitting: Oncology

## 2016-06-21 ENCOUNTER — Ambulatory Visit: Payer: 59

## 2016-06-21 ENCOUNTER — Other Ambulatory Visit: Payer: 59

## 2016-06-21 DIAGNOSIS — Z7189 Other specified counseling: Secondary | ICD-10-CM

## 2016-06-21 DIAGNOSIS — Z515 Encounter for palliative care: Secondary | ICD-10-CM

## 2016-06-21 LAB — MAGNESIUM: Magnesium: 1.7 mg/dL (ref 1.7–2.4)

## 2016-06-21 LAB — BASIC METABOLIC PANEL
ANION GAP: 4 — AB (ref 5–15)
BUN: 10 mg/dL (ref 6–20)
CHLORIDE: 106 mmol/L (ref 101–111)
CO2: 28 mmol/L (ref 22–32)
Calcium: 7 mg/dL — ABNORMAL LOW (ref 8.9–10.3)
Creatinine, Ser: 0.53 mg/dL — ABNORMAL LOW (ref 0.61–1.24)
GFR calc Af Amer: >60 mL/min (ref >60)
GFR calc non Af Amer: >60 mL/min (ref >60)
GLUCOSE: 100 mg/dL — AB (ref 65–99)
POTASSIUM: 3.7 mmol/L (ref 3.5–5.1)
Sodium: 138 mmol/L (ref 135–145)

## 2016-06-21 LAB — CBC
HEMATOCRIT: 33.7 % — AB (ref 39.0–52.0)
HEMOGLOBIN: 10.8 g/dL — AB (ref 13.0–17.0)
MCH: 28.3 pg (ref 26.0–34.0)
MCHC: 32 g/dL (ref 30.0–36.0)
MCV: 88.2 fL (ref 78.0–100.0)
Platelets: 252 10*3/uL (ref 150–400)
RBC: 3.82 MIL/uL — ABNORMAL LOW (ref 4.22–5.81)
RDW: 17.4 % — AB (ref 11.5–15.5)
WBC: 21.5 10*3/uL — ABNORMAL HIGH (ref 4.0–10.5)

## 2016-06-21 MED ORDER — METOCLOPRAMIDE HCL 5 MG/5ML PO SOLN
5.0000 mg | Freq: Three times a day (TID) | ORAL | Status: DC
  Administered 2016-06-21: 5 mg via ORAL
  Filled 2016-06-21 (×2): qty 5

## 2016-06-21 MED ORDER — METOCLOPRAMIDE HCL 5 MG/5ML PO SOLN
5.0000 mg | Freq: Three times a day (TID) | ORAL | 0 refills | Status: AC
Start: 2016-06-21 — End: ?

## 2016-06-21 MED ORDER — HALOPERIDOL LACTATE 2 MG/ML PO CONC
0.5000 mg | Freq: Four times a day (QID) | ORAL | Status: DC | PRN
  Filled 2016-06-21: qty 0.3

## 2016-06-21 MED ORDER — SIMETHICONE 40 MG/0.6ML PO SUSP
40.0000 mg | Freq: Four times a day (QID) | ORAL | Status: DC
  Administered 2016-06-21: 40 mg via ORAL
  Filled 2016-06-21 (×3): qty 0.6

## 2016-06-21 MED ORDER — LORAZEPAM 2 MG/ML PO CONC: 1.0000 mg | ORAL | Status: DC | PRN

## 2016-06-21 MED ORDER — SIMETHICONE 40 MG/0.6ML PO SUSP: 40.0000 mg | Freq: Four times a day (QID) | ORAL | 0 refills | Status: AC

## 2016-06-21 MED ORDER — ENSURE ENLIVE PO LIQD: 237.0000 mL | Freq: Two times a day (BID) | ORAL | Status: DC | PRN

## 2016-06-21 MED ORDER — SODIUM CHLORIDE 0.9 % IV BOLUS (SEPSIS)
1000.0000 mL | Freq: Once | INTRAVENOUS | Status: DC
  Administered 2016-06-21: 1000 mL via INTRAVENOUS

## 2016-06-21 MED ORDER — HALOPERIDOL LACTATE 2 MG/ML PO CONC: 0.5000 mg | Freq: Four times a day (QID) | ORAL | 0 refills | Status: AC | PRN

## 2016-06-21 MED ORDER — SODIUM CHLORIDE 0.9 % IV BOLUS (SEPSIS)
500.0000 mL | Freq: Once | INTRAVENOUS | Status: AC
  Administered 2016-06-21: 500 mL via INTRAVENOUS

## 2016-06-21 MED ORDER — SENNOSIDES-DOCUSATE SODIUM 8.6-50 MG PO TABS: 1.0000 | ORAL_TABLET | Freq: Every day | ORAL | Status: AC

## 2016-06-21 NOTE — Progress Notes (Signed)
Daily Progress Note   Patient Name: Roy English       Date: 06/21/2016 DOB: 1951/03/18  Age: 65 y.o. MRN#: [id number] Attending Physician: Patrecia Pour, MD Primary Care Physician: Golden Pop, MD Admit Date: 06/12/2016  Reason for Consultation/Follow-up: Disposition, Establishing goals of care, Inpatient hospice referral, Non pain symptom management and Psychosocial/spiritual support  Subjective: I met with [name] at his bedside. His wife and sister were present, as was Dr. Rowe Pavy, also with Palliative Care. [name] and his family had met with his Oncologist, Dr. Benay Spice, and primary team, Dr. Bonner Puna, this morning. They had discussed his progressive symptom burden and overall goals of care. [name] was presented with three options for discharge in light of his goals: 1. Home with home health with outpt oncology follow-up, 2. SNF with palliative with outpt oncology follow-up, 3. Hospice (home, residential, or SNF). When we discussed these options with him, [name] felt Hospice was the best option, and wished to pursue residential Hospice in [place] [name] [place]). Presently, he has ongoing and uncontrolled abdominal pain, which is both a gas pain and peristalsis pressure. He is experiencing mild nausea. Finally, he has profound weakness and fatigue.   Length of Stay: 8  Current Medications: Scheduled Meds:  . feeding supplement (ENSURE ENLIVE)  237 mL Oral BID BM  . HYDROmorphone  2-4 mg Oral Q4H  . lipase/protease/amylase  12,000 Units Oral TID WC  . mouth rinse  15 mL Mouth Rinse BID  . mirtazapine  15 mg Oral QHS  . pantoprazole  40 mg Oral Daily  . polyethylene glycol  17 g Oral Daily  . senna-docusate  1 tablet Oral Daily  . sodium chloride flush  3 mL Intravenous Q12H  . tamsulosin  0.4 mg Oral QPC breakfast     Continuous Infusions:   PRN Meds: acetaminophen, bisacodyl, HYDROmorphone (DILAUDID) injection, iopamidol, ondansetron **OR** ondansetron (ZOFRAN) IV, simethicone  Physical Exam  Constitutional: He is oriented to person, place, and time.  HENT:  Head: Normocephalic and atraumatic.  Nose: Nose normal.  Mouth/Throat: Oropharynx is clear and moist.  Eyes: EOM are normal.  Neck: Normal range of motion.  Pulmonary/Chest:  Increased WOB with prolonged conversation  Abdominal: He exhibits distension.  Neurological: He is alert and oriented to person, place, and time.  Skin: Skin is warm and dry. There is pallor.  Psychiatric: His speech is normal. Judgment and thought content normal. He is slowed and withdrawn. Cognition and memory are normal. He exhibits a depressed mood.            Vital Signs: BP 105/72 (BP Location: Right Arm)   Pulse (!) 122   Temp 99.1 F (37.3 C) (Oral)   Resp (!) 24   Ht 5' 9" (1.753 m)   Wt 69.3 kg (152 lb 11.2 oz)   SpO2 90%   BMI 22.55 kg/m  SpO2: SpO2: 90 % O2 Device: O2 Device: Nasal Cannula O2 Flow Rate: O2 Flow Rate (L/min): 2 L/min  Intake/output summary:  Intake/Output Summary (Last 24 hours) at 06/21/16 0946 Last data filed at 06/21/16 0600  Gross per 24 hour  Intake  3435 ml  Output              850 ml  Net             2585 ml   LBM: Last BM Date: 06/20/16 Baseline Weight: Weight: 63.5 kg (140 lb) Most recent weight: Weight: 69.3 kg (152 lb 11.2 oz)       Palliative Assessment/Data: PPS 40%   Flowsheet Rows   Flowsheet Row Most Recent Value  Intake Tab  Referral Department  Hospitalist  Unit at Time of Referral  Oncology Unit  Palliative Care Primary Diagnosis  Cancer  Palliative Care Type  New Palliative care  Reason for referral  Non-pain Symptom, Clarify Goals of Care, Counsel Regarding Hospice, End of Life Care Assistance, Advance Care Planning, Psychosocial or Spiritual support, Pain  Date first seen by  Palliative Care  06/20/16  Clinical Assessment  Palliative Performance Scale Score  30%  Pain Max last 24 hours  6  Pain Min Last 24 hours  4  Dyspnea Max Last 24 Hours  6  Dyspnea Min Last 24 hours  4  Nausea Max Last 24 Hours  6  Nausea Min Last 24 Hours  4  Psychosocial & Spiritual Assessment  Palliative Care Outcomes  Patient/Family meeting held?  Yes  Who was at the meeting?  patient wife sister  Palliative Care Outcomes  Clarified goals of care      Patient Active Problem List   Diagnosis Date Noted  . AKI (acute kidney injury) ([place]) 06/14/2016  . Neutropenic fever ([place]) 06/13/2016  . Febrile neutropenia ([place]) 06/13/2016  . Sepsis due to undetermined organism ([place]) 06/13/2016  . 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 Malignant neoplasm of head of pancreas ([place]) 10/03/2015  . Biliary stent migration   . Abdominal pain 09/21/2015  . Hypokalemia 09/21/2015  . Common bile duct stricture   . Pancreatic mass   . Elevated liver enzymes 09/01/2015  . Interstitial lung disease ([place]) 06/22/2012    Palliative Care Assessment & Plan   HPI: Roy English an 65 y.o.malepast medical history of actinic keratosis, idiopathic interstitial fibrosis of the lungs, carcinoma of the pancreas, acquired dilated common bile duct with stent replacement on 09/23/2015 who comes in for abdominal pain, mild coug,h and dyspnea. Patient woke up on the morning of admission and started having emesis 5-6 loose stools for the past 2 days. In the ED he was found to have hypotensive post hydromorphone so he was started on normal saline, Abdominal ultrasound was done that showed minimal ascites,CT scan of the abdomen and pelvis showed right-sided colitis. He was aggressively fluid hydrated through the sepsis protocol and lactic acidosis resolved. Repeat CT abdomen due to failure to improve with abdominal pain performed 11/15 suggestive of partial SBO due to small bowel wall thickening in  the setting of opioid analgesics. Resolved with BM 11/15. He has ambulated, and continues to have abdominal pain controlled with po dilaudid. While tolerating fluids, he continues to have inadequate po intake limiting his ability to maintain hydration independently and reliably. IV antibiotics and IV fluids continuing. 11/18: abdomen more taut, suspect accumulation of ascites sufficient for paracentesis, both therapeutic and diagnostic. 11/19: patient underwent paracentesis, removal of 730 cc of clear ascitic fluid, studies pending.  Recommendations/Plan: -Pt is clear on his desire to pursue Hospice. Given his uncontrolled symptom burden (which I would expect to worsen), marked decline over the past week, and terminal cancer, I would recommend pursuing  residential hospice placement. He and his family are in agreement with this.  -SW called for Hospice consult; pt would like [facility] (in [place])  -DNR placed in chart, as discussed with pt and family  Pain:  -Continue Tylenol 666m q4H PRN  -Continue scheduled dilaudid [id number]   -Continue IV dilaudid 71m50m2H PRN while here, this can be stopped on discharge  -Schedule simethicone 71m671mD  -Start Reglan 5mg 9mTID Anxiety/Nausea   -Continue Zofran 4mg P67m6H PRN   -Start Haldol 0.5mg SL34mH PRN   -Start Ativan 71mg SL 96m PRN  Bowels  -Continue Dulcolax PR daily PRN  -Continue Miralax 1 packet daily  -Continue Senokot-S 1 tab daily Sleep  -Continue Remeron 15mg qHS16moals of Care and Additional Recommendations:  Limitations on Scope of Treatment: Full Comfort Care  Code Status:  DNR  Prognosis:   < 2 weeks; pt has minimal oral intake with high risk for dehydration r/t third spacing. Increased symptom burden with known metastatic disease progression.   Discharge Planning:  Hospice facility  Care plan was discussed with pt, pt's wife and sister, social worker, and Dr. Grunz  ThBonner Punaou for allowing the  Palliative Medicine Team to assist in the care of this patient.   Time In: 0945 Time Out: 1020 Total Time 35 minutes Prolonged Time Billed  no       Greater than 50%  of this time was spent counseling and coordinating care related to the above assessment and plan.   [place]iative Medicine Team Team Phone # [phone]

## 2016-06-21 NOTE — Progress Notes (Signed)
No improvement in patient's HR. On-call provider paged. Ordered received to give 1000 cc NS bolus. RN paged provider back as patient is edematous and + intake of approximately 11 liter. Instructed to stop the bolus and differ treatment to attending.

## 2016-06-21 NOTE — Progress Notes (Signed)
CSW received call from Dr. Rowe Pavy informing that patient's family has decided on residential hospice & requesting [facility]. CSW made referral to [facility] at [facility], awaiting call back re: bed availability/eligibility.    Raynaldo Opitz, [facility] Clinical Social Worker cell #: [phone]

## 2016-06-21 NOTE — Progress Notes (Signed)
IP PROGRESS NOTE  Subjective:   Roy English continues to have abdominal pain, but this has improved. He has decided against further chemotherapy. He desires Comfort Care. He underwent a palliative paracentesis yesterday.  Objective: Vital signs in last 24 hours: Blood pressure 105/72, pulse (!) 122, temperature 99.1 F (37.3 C), temperature source Oral, resp. rate (!) 24, height 5\' 9"  (1.753 m), weight 152 lb 11.2 oz (69.3 kg), SpO2 90 %.  Intake/Output from previous day: 11/19 0701 - 11/20 0700 In: 3435 [P.O.:60; I.V.:3375] Out: 850 [Urine:850]  Physical Exam: HEENT-whitecoat over the tongue Lungs: Decreased breath sounds at the lower chest, scattered inspiratory rales at the anterior and posterior chest Cardiac: Regular rate and rhythm Abdomen: Distended, tender in the right lower abdomen, no mass GU: Scrotal/penile edema    Portacath/PICC-without erythema  Lab Results:  Recent Labs  06/19/16 0512 06/21/16 0450  WBC 15.5* 21.5*  HGB 11.4* 10.8*  HCT 34.5* 33.7*  PLT 178 252    BMET  Recent Labs  06/20/16 0501 06/20/16 0504 06/21/16 0450  NA 139  --  138  K 3.8  --  3.7  CL 107  --  106  CO2 27  --  28  GLUCOSE 107*  --  100*  BUN 9  --  10  CREATININE 0.50* 0.47* 0.53*  CALCIUM 7.1*  --  7.0*    Studies/Results: US Paracentesis  Result Date: 06/20/2016 INDICATION: Pancreatic cancer. Abdominal distention. Ascites. Request diagnostic and therapeutic paracentesis. EXAM: ULTRASOUND GUIDED LEFT LOWER QUADRANT PARACENTESIS MEDICATIONS: None. COMPLICATIONS: None immediate. PROCEDURE: Informed written consent was obtained from the patient after a discussion of the risks, benefits and alternatives to treatment. A timeout was performed prior to the initiation of the procedure. Initial ultrasound scanning demonstrates a small volume of ascites within the left lower abdominal quadrant. The left lower abdomen was prepped and draped in the usual sterile fashion. 1%  lidocaine with epinephrine was used for local anesthesia. Following this, a Safe-T-Centesis catheter was introduced. An ultrasound image was saved for documentation purposes. The paracentesis was performed. The catheter was removed and a dressing was applied. The patient tolerated the procedure well without immediate post procedural complication. FINDINGS: A total of approximately 730 mL of clear yellow fluid was removed. Samples were sent to the laboratory as requested by the clinical team. IMPRESSION: Successful ultrasound-guided paracentesis yielding 730 mL of peritoneal fluid. Read by: Ascencion Dike PA-C Electronically Signed   By: Corrie Mckusick D.O.   On: 06/20/2016 09:37    Medications: I have reviewed the patient's current medications.  Assessment/Plan: 1. Pancreas cancer, clinical T3 N0, pancreas head mass, status post an EUS biopsy at [facility] on 09/17/2015 confirming a poorly differentiate a carcinoma  ultrasound staging 09/17/2015, T2 N0  CT chest at [facility] 09/29/2015 with patchy groundglass opacities throughout both lungs, nonspecific 5 mm right lung nodule  MRI liver 09/07/2015 revealed a pancreas head mass with bile duct obstruction, no focal liver lesions, no lymphadenopathy  CT abdomen/pelvis 09/21/2015-pancreas head mass with intra-and extrahepatic bile duct dilation despite placement of a stent, suspicion for SMV involvement  Xeloda/radiation started 10/15/2015  Xeloda placed on hold beginning 11/07/2015 due to progression of skin rash  Xeloda resumed at a reduced dose of 500 mg twice daily 11/10/2015  Xeloda discontinued 11/19/2015  Radiation completed 11/24/2015  Restaging CTs 12/24/2015 with enlargement of the pancreas head mass and development of liver metastases  Gemcitabine/Abraxane day 1/day 8/day 15 schedule of a 28 day cycle, Started 01/09/2016  Cycle 2 gemcitabine/Abraxane day one/day 8/day 15 schedule of a 28 day cycle (02/06/2016, 02/13/2016,  02/20/2016)  Gemcitabine/Abraxane schedule changed to every 2 weeks due to neuropathy and fatigue  CA-19-9 improved 03/05/2016  Restaging CT 04/02/2016 with decreased size of the pancreas mass, overall stable size of liver metastases with enlargement of one lesion  Abraxane held from the chemotherapy regimen beginning 04/02/2016  CT abdomen/pelvis 06/12/2016 consistent with progression of hepatic metastases, decrease in the pancreas head mass, progressive ascites, irregular wall thickening at the small bowel in the right abdomen  2. Idiopathic interstitial fibrosis of the lungs  3. Pain secondary to #1  4. Weight loss  5. 14 mm enhancing posterior right renal mass on CT 09/21/2015 suspicious for a renal cell carcinoma.  6. Rash on trunk and extremities secondary to Xeloda. 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 Progressive 11/07/2015. Xeloda placed on hold. Resumed at reduced dose 11/10/2015. Discontinued 11/19/2015.  7. Port-A-Cath placement 01/07/2016, interventional radiology  8. Fever 02/22/2016 status post evaluation in the emergency department. Chest x-ray and cultures negative. Fever likely related to gemcitabine.He is taking Tylenol prophylactically for a few days following each treatment.  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  10.  Admission 06/12/2016 with nausea/vomiting, abdominal pain, and diarrhea-repeat CTs reveal areas of small bowel thickening and partial small bowel obstruction 11.  urinary retention-Foley catheter in place  Roy English is symptomatic with abdominal distention/pain. He continues to have a poor performance status. I discussed the situation at length with Roy English and his family. We discussed CPR and ACLS issues. He agrees to a no CODE BLUE status. He agrees to Hospice care. We discussed home hospice versus residential hospice. He and his family will decide on this today. He  lives in [place], but may desire care in [place] or [place].  Recommendations: 1. Discontinue IV fluids 2. Continue Dilaudid as needed for pain 3. Hospice referral for residential hospice versus home hospice 4. I will plan to follow him with the hospice team at discharge      LOS: 8 days   Betsy Coder, MD   06/21/2016, 7:46 AM

## 2016-06-21 NOTE — Progress Notes (Signed)
Report given to Carolynn Serve, Therapist, sports at [facility].

## 2016-06-21 NOTE — Progress Notes (Signed)
Nutrition Brief Note  65 y.o.malepast medical history of actinic keratosis, idiopathic interstitial fibrosis of the lungs, carcinoma of the pancreas, acquired dilated common bile duct with stent replacement on 09/23/2015 who comes in for abdominal pain, mild coug,h and dyspnea. Patient woke up on the morning of admission and started having emesis 5-6 loose stools for the past 2 days.  Patient screened to be seen by RD due to Malnutrition Screening Tool. Reviewed chart and discussed with RN. Patient now transitioning to full comfort care. [facility] be [facility] to [facility] pending bed availability.  Patient already ordered for Ensure Enlive po BID per protocol. Will change order to PRN as desired for comfort feeding. No further nutrition interventions warranted at this time.   Please re-consult as needed.   Willey Blade, MS, RD, LDN Pager: [phone] After Hours Pager: [phone]

## 2016-06-21 NOTE — Progress Notes (Signed)
Patient complained of urge to urinate during scheduled medication pass at approximately 00:15. RN checked the catheter line for kinks, flushed catheter with 10 cc NS after catheter care and scrubbing the port. No obstruction noted. Still little to no urine output. Catheter then exchanged under sterile technique. Urine returned. Still no good output. Patient's bladder scanned. < 80 cc found.

## 2016-06-21 NOTE — Progress Notes (Signed)
Patient is set to discharge to [facility] today. Patient & wife at bedside, [name] aware. Discharge packet given to RN, Archer Asa. [facility] called for transport.     Raynaldo Opitz, [facility] Clinical Social Worker cell #: [phone]

## 2016-06-21 NOTE — Progress Notes (Signed)
RN notified by CMT that patient's HR had increased to the 140's nonsustained. Patient in bed resting. No signs of distress. EKG obtained- Sinus tachycardia with 2nd degree AV block with frequent premature ventricular complexes. Low voltage QRS, ST & T wave abnormality, consider anterior ischemia. On-call provider paged and order received to give 500 cc NS bolus. Bolus given at 00:45. No change in patient's HR.

## 2016-06-21 NOTE — Discharge Summary (Signed)
Physician Discharge Summary  Roy English [id number] DOB: [birth year] DOA: 06/12/2016  PCP: Golden Pop, MD  Admit date: 06/12/2016 Discharge date: 06/21/2016  Admitted From: Home Disposition: [facility]   Recommendations for Outpatient Follow-up:  1. Comfort care  Home Health: None Equipment/Devices: 2L O2, foley (11/17)  Discharge Condition: Stable for transfer, terminal conditions CODE STATUS: DNR Diet recommendation: As tolerated  Brief/Interim Summary: Roy English is an 65 y.o. 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 Patient woke up on the morning of admission starting having emesis 5-6 loose stools for the past 2 days. In the ED was found to have hypotensive post hydromorphone so he was started on normal saline, Abdominal ultrasound was done that showed minimal ascites,CT scan of the abdomen and pelvis showed right-sided colitis. He was aggressively fluid hydrated through the sepsis protocol and lactic acidosis resolved. Repeat CT abdomen due to failure to improve with abdominal pain performed 11/15 suggestive of partial SBO due to small bowel wall thickening in the setting of opioid analgesics. Resolved with BM 11/15. While tolerating fluids, he continues to have inadequate po intake limiting his ability to maintain hydration independently and reliably. IV antibiotics and IV fluids were provided through a full course for intraabdominal infect and IV fluids were discontinued due to worsening third-spacing. On 11/18, abdomen had grown more taut, suspected accumulation of ascites. Therapeutic and diagnostic paracentesis was performed 11/19 with 730cc out, but unfortunately this resulted in no appreciable improvement in pain. Palliative care was consulted for  continued and worsening symptom burden and after a family visit, comfort measures were selected for care and he is being transferred to hospice. See discharge medication list. Comfort care orders are per palliative care team as below:   Pain:             -Continue Tylenol 650mg  q4H PRN             -Continue scheduled dilaudid 2-4mg  q4H              -Continue IV dilaudid 1mg  q2H PRN while here, this can be stopped on discharge             -Schedule simethicone 40mg  QID             -Start Reglan 5mg  PO TID Anxiety/Nausea                        -Continue Zofran 4mg  PO q6H PRN              -Start Haldol 0.5mg  SL q6H PRN              -Start Ativan 1mg  SL q4H PRN  Bowels             -Continue Dulcolax PR daily PRN             -Continue Miralax 1 packet daily             -Continue Senokot-S 1 tab daily Sleep             -Continue Remeron 15mg  qHS  Discharge Diagnoses:  Principal Problem:   Febrile neutropenia ([place]) Active Problems:   Interstitial lung disease ([place])   Hypokalemia   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   CREON 3000-9500 units Cpep Generic drug:  Pancrelipase (Lip-Prot-Amyl) Take 3 capsules by mouth 3 (three) times daily with meals.   haloperidol 2 MG/ML solution Commonly known as:  HALDOL Place 0.3 mLs (0.6 mg total) under the tongue every 6 (six) hours as needed for agitation (nausea).   HYDROmorphone 2 MG tablet Commonly known  as:  DILAUDID Take 1-2 tablets (2-4 mg total) by mouth every 4 (four) hours as needed for severe pain.   lactose free nutrition Liqd Take 237 mLs by mouth 2 (two) times daily between meals.   lidocaine-prilocaine cream Commonly known as:  EMLA Apply 1 application topically as needed (prior to accessing port).   LORazepam 0.5 MG tablet Commonly known as:  ATIVAN TAKE 1 TABLET BY MOUTH EVERY 4 HOURS AS NEEDED FOR ANXIETY/NAUSEA   metoCLOPramide 5 MG/5ML solution Commonly known as:  REGLAN Take 5 mLs (5 mg total) by mouth 3 (three) times daily before meals.   mirtazapine 15 MG tablet Commonly known as:  REMERON Take 1 tablet (15 mg total) by mouth at bedtime.   omeprazole 20 MG capsule Commonly known as:  PRILOSEC Take 20 mg by mouth 2 (two) times daily.   ondansetron 8 MG tablet Commonly known as:  ZOFRAN Take 1 tablet (8 mg total) by mouth every 6 (six) hours as needed for nausea or vomiting. What changed:  when to take this  reasons to take this   polyethylene glycol packet Commonly known as:  MIRALAX / GLYCOLAX Take 17 g by mouth daily.   prochlorperazine 10 MG tablet Commonly known as:  COMPAZINE Take 10 mg by mouth every 6 (six) hours as needed for nausea or vomiting.   senna-docusate 8.6-50 MG tablet Commonly known as:  Senokot-S Take 1 tablet by mouth daily. Start taking on:  06/22/2016   simethicone 40 MG/0.6ML drops Commonly known as:  MYLICON Take 0.6 mLs (40 mg total) by mouth 4 (four) times daily.            Durable Medical Equipment        Start     Ordered   06/18/16 1243  For home use only DME 3 n 1  Once     06/18/16 1243   06/18/16 1243  For home use only DME Walker rolling  Once    Question Answer Comment  Patient needs a walker to treat with the following condition Physical debility   Patient needs a walker to treat with the following condition Pancreatic cancer ([place])      06/18/16 1243      Allergies  Allergen Reactions  .  Penicillins Other (See Comments)    Reaction:  Unknown  Has patient had a PCN reaction causing immediate rash, facial/tongue/throat swelling, SOB or lightheadedness with hypotension: Unsure Has patient had a PCN reaction causing severe rash involving mucus membranes or skin necrosis: Unsure Has patient had a PCN reaction that  required hospitalization Unsure Has patient had a PCN reaction occurring within the last 10 years: No If all of the above answers are "NO", then may proceed with Cephalosporin use.    Consultations:  General surgery 11/15 for partial SBO.  Oncology, Dr. Benay Spice  Palliative care medicine, Dr. Rowe Pavy  Antibiotics:  Cefepime 06/12/2016 >> 11/19 Flagyl 06/16/2016 >> 11/19  Procedures/Studies: US Abdomen Complete  Result Date: 06/14/2016 CLINICAL DATA:  Metastatic pancreatic cancer with abdominal pain 4 days and elevated liver function tests. EXAM: ABDOMEN ULTRASOUND COMPLETE COMPARISON:  CT from 06/12/2016, abdominal ultrasound 09/03/2015 new FINDINGS: Gallbladder: Gallbladder is contracted with biliary sludge. Mild sympathetic thickening of the gallbladder wall measuring up to 5.9 mm in thickness as a result of contraction as well as small volume ascites and pericholecystic fluid. No gallstones are visualized. Common bile duct: Diameter: Normal at 4.4 mm Liver: Inhomogeneous in appearance with several sonographically apparent hypoechoic solid masslike abnormalities the largest is 4.5 x 4 x 3.8 cm in the right hepatic dome. IVC: No abnormality visualized. Pancreas: Hypoechoic solid mass involving the pancreatic head measuring 2.8 x 2.5 x 2.4 cm. Pancreatic duct is top-normal at 3.4 mm. Spleen: Size and appearance within normal limits. Right Kidney: Length: 12.1 cm. Echogenicity within normal limits. No mass or hydronephrosis visualized. Left Kidney: Length: 13.4 cm. Echogenicity within normal limits. No mass or hydronephrosis visualized. Abdominal aorta: The mid and distal  aorta were obscured. Maximum proximal caliber of the aorta was 2.5 cm. Other findings: Small volume ascites adjacent to the liver and spleen. IMPRESSION: Inhomogeneous liver parenchyma with several hypoechoic solid masslike abnormalities consistent with metastatic disease, the largest approximately 4.5 x 4 x 3.8 cm in the right hepatic dome. These measure slightly larger than on recent CT but may be due to differences in technique and operator dependent imaging. Hypoechoic pancreatic head mass without pancreatic ductal dilatation measuring 2.8 x 2.5 x 2.4 cm. This measures slightly larger than on prior CT from 2 days ago (1.8 x 1.5 cm by CT), again likely due to operator dependent imaging differences and modality differences. Small volume ascites. Electronically Signed   By: Ashley Royalty M.D.   On: 06/14/2016 14:25   Ct Abdomen Pelvis W Contrast  Result Date: 06/15/2016 CLINICAL DATA:  Abdominal pain, cough and dyspnea; history of actinic keratoses, idiopathic interstitial fibrosis, pancreatic carcinoma, common bile duct with stent placement 09/23/2015. EXAM: CT ABDOMEN AND PELVIS WITH CONTRAST TECHNIQUE: Multidetector CT imaging of the abdomen and pelvis was performed using the standard protocol following bolus administration of intravenous contrast. CONTRAST:  118mL ISOVUE-300 IOPAMIDOL (ISOVUE-300) INJECTION 61% COMPARISON:  06/12/2016 CT. FINDINGS: Lower chest: Small bilateral pleural effusions have developed in the interim with adjacent compressive atelectasis. Mosaic ground-glass opacities in the right middle lobe and lingula. Coronary arteriosclerosis noted of the borderline enlarged heart. No pericardial effusion. Catheter tip is seen in the distal SVC. Hepatobiliary: No significant change with reference to the multiple hepatic metastases compared to recent study 3 days ago, the largest in the right hepatic lobe measuring approximately 3.2 cm. No biliary dilatation. Sympathetic thickening of the  gallbladder without stones due to adjacent ascites Pancreas: Stable 1.6 cm mass in the head of the pancreas without pancreatic ductal dilatation. Spleen: Normal in size without focal abnormality. Adrenals/Urinary Tract: Normal bilateral adrenal glands. 1.3 cm enhancing mass off the interpolar aspect of the right kidney is again seen without significant interval change from recent comparison but concerning for renal cell carcinoma given its enhancement. No obstructive uropathy.  Left kidney is unremarkable. Bladder contains a Foley catheter and is slightly decompressed as a result. Tiny focus of air within the bladder consistent with instrumentation. Stomach/Bowel: The stomach is not distended. There is no gastric wall thickening. No gastric outlet obstruction. Normal bowel rotation seen. Segmental areas of small bowel thickening are noted in the left hemi abdomen involving unopacified jejunum as well some right hemi abdomen involving opacified (with oral contrast) ileal loops. Thickening of the ileal loops are believed to account for more proximal small bowel dilatation up to 3 cm. Contrast however reaches the cecum without obstruction. Findings likely represent mild to moderate enteritis with mild partial small bowel obstruction. Vascular/Lymphatic: Abdominal aortic atherosclerosis without aneurysm. No lymphadenopathy. Reproductive: Prostate is enlarged. Other: Moderate volume of ascites, increased since prior exam adjacent to the liver and predominantly within the pelvis. Musculoskeletal: No worrisome lytic or blastic disease. No acute osseus abnormality. IMPRESSION: New since recent comparison exam are bilateral small pleural effusions with compressive atelectasis and slight ground-glass mosaic appearance of the right middle lobe and lingula, interval increase in moderate volume ascites, and segmental thickening of small bowel loops involving un-opacified jejunum and opacified ileal loops, the thickening involving  the ileum appears be contributing to a mild partial SBO. Unchanged hepatic metastasis, pancreatic head neoplasm and right interpolar enhancing renal mass. Electronically Signed   By: Ashley Royalty M.D.   On: 06/15/2016 15:24   Ct Abdomen Pelvis W Contrast  Result Date: 06/12/2016 CLINICAL DATA:  Fever with nausea and vomiting along with diarrhea today. Pancreatic cancer. EXAM: CT ABDOMEN AND PELVIS WITH CONTRAST TECHNIQUE: Multidetector CT imaging of the abdomen and pelvis was performed using the standard protocol following bolus administration of intravenous contrast. CONTRAST:  197mL ISOVUE-300 IOPAMIDOL (ISOVUE-300) INJECTION 61% COMPARISON:  04/02/2016 FINDINGS: Lower chest:  Interstitial lung disease seen in the bases is stable. Hepatobiliary: Multiple hepatic metastases are again noted. 2.7 cm lesion in the dome of the liver today was 2.1 cm previously. 3.1 cm right hepatic lesion seen on image 13 series 2 of today's exam measured 2.3 cm previously. A 2.8 x 2.3 cm lesion in the medial dome measured on the prior study is now 2.8 x 2.9 cm. Other metastases of similar size appear progressed. Gallbladder wall shows some ill definition and mild thickening. No intrahepatic or extrahepatic biliary dilation. Pancreas: Common bile duct stent seen previously is no longer evident. Hypo attenuating lesion in the pancreatic head measures 1.8 x 1.5 cm today compared to 2.4 x 3.2 cm previously. No substantial dilatation of the main pancreatic duct. Spleen: No splenomegaly. No focal mass lesion. Adrenals/Urinary Tract: No adrenal nodule or mass. 1.4 cm exophytic enhancing lesion identified interpolar right kidney posteriorly (see image 35 series 2) compared to 1.2 cm same axis previously. Left kidney unremarkable No evidence for hydroureter. Mild bladder wall thickening is seen most notably at the dome. Stomach/Bowel: Stomach is nondistended. No gastric wall thickening. No evidence of outlet obstruction. Duodenum is  normally positioned as is the ligament of Treitz. No evidence for small bowel dilatation. Small bowel loops in the right abdomen (see image 38 series 2) show ill-defined wall thickening with perienteric edema/ inflammation and associated interloop mesenteric fluid these are probably ileal loops, but the terminal ileum appears relatively preserved. Appendix not well seen. Minimal diverticular changes noted left colon. Vascular/Lymphatic: There is abdominal aortic atherosclerosis without aneurysm. There is no gastrohepatic or hepatoduodenal ligament lymphadenopathy. No intraperitoneal or retroperitoneal lymphadenopathy. No pelvic sidewall lymphadenopathy. Reproductive: Prostate gland is enlarged.  Other: Small to moderate volume free fluid noted in the pelvis with a tiny amount of fluid seen adjacent to the liver and in the right para colic gutter. Musculoskeletal: Bone windows reveal no worrisome lytic or sclerotic osseous lesions. IMPRESSION: 1. Areas of mid to distal small bowel in the right abdomen show irregular wall thickening with perienteric edema/ inflammation. Infectious/inflammatory enteritis would be a consideration. 2.  Interval progression of hepatic metastases. 3. Interval continued decrease in size of the pancreatic head mass. 4. No substantial change in the enhancing interpolar right renal lesion, suspicious for primary renal cell carcinoma. 5. Small to moderate volume ascites, progressive in the interval. 6. Abdominal aortic atherosclerosis. Electronically Signed   By: Misty Stanley M.D.   On: 06/12/2016 23:50   US Abdomen Limited  Result Date: 06/14/2016 CLINICAL DATA:  Abdominal distention EXAM: LIMITED ABDOMEN ULTRASOUND FOR ASCITES TECHNIQUE: Limited ultrasound survey for ascites was performed in all four abdominal quadrants. COMPARISON:  CT abdomen and pelvis June 12, 2016 FINDINGS: There is mild ascites scattered throughout the abdomen and pelvis. There are multiple loops of  peristalsing bowel. IMPRESSION: Mild ascites. Electronically Signed   By: Lowella Grip III M.D.   On: 06/14/2016 14:05   US Paracentesis  Result Date: 06/20/2016 INDICATION: Pancreatic cancer. Abdominal distention. Ascites. Request diagnostic and therapeutic paracentesis. EXAM: ULTRASOUND GUIDED LEFT LOWER QUADRANT PARACENTESIS MEDICATIONS: None. COMPLICATIONS: None immediate. PROCEDURE: Informed written consent was obtained from the patient after a discussion of the risks, benefits and alternatives to treatment. A timeout was performed prior to the initiation of the procedure. Initial ultrasound scanning demonstrates a small volume of ascites within the left lower abdominal quadrant. The left lower abdomen was prepped and draped in the usual sterile fashion. 1% lidocaine with epinephrine was used for local anesthesia. Following this, a Safe-T-Centesis catheter was introduced. An ultrasound image was saved for documentation purposes. The paracentesis was performed. The catheter was removed and a dressing was applied. The patient tolerated the procedure well without immediate post procedural complication. FINDINGS: A total of approximately 730 mL of clear yellow fluid was removed. Samples were sent to the laboratory as requested by the clinical team. IMPRESSION: Successful ultrasound-guided paracentesis yielding 730 mL of peritoneal fluid. Read by: Ascencion Dike PA-C Electronically Signed   By: Corrie Mckusick D.O.   On: 06/20/2016 09:37   Dg Abd Portable 1v  Result Date: 06/17/2016 CLINICAL DATA:  Pancreatic carcinoma. Recent small bowel obstruction EXAM: PORTABLE ABDOMEN - 1 VIEW COMPARISON:  CT abdomen and pelvis June 15, 2016. FINDINGS: Contrast is seen in the left colon. There are diverticular in the left colon. There is no appreciable bowel dilatation or air-fluid level to suggest bowel obstruction. No evident free air. There are phleboliths in the pelvis. IMPRESSION: Apparent interval  resolution of bowel obstruction. Left colonic diverticulum. No free air. Electronically Signed   By: Lowella Grip III M.D.   On: 06/17/2016 10:02     Subjective: Pain uncontrolled, increasing despite medications. Doesn't want to live like this.   Discharge Exam: Vitals:   06/20/16 2124 06/21/16 0546  BP: 101/62 105/72  Pulse: (!) 108 (!) 122  Resp: 18 (!) 24  Temp: 99.2 F (37.3 C) 99.1 F (37.3 C)   Vitals:   06/20/16 0900 06/20/16 1504 06/20/16 2124 06/21/16 0546  BP: 95/61 (!) 101/55 101/62 105/72  Pulse:  (!) 102 (!) 108 (!) 122  Resp:  19 18 (!) 24  Temp:  98.1 F (36.7 C) 99.2 F (37.3  C) 99.1 F (37.3 C)  TempSrc:  Oral Oral Oral  SpO2:  91% 92% 90%  Weight:      Height:       General: Chronically ill and weak appearing male in no acute distress Cardiovascular: Tachycardic, regular, no murmur Respiratory: Mildly labored with conversation, markedly dyspneic with exertion on supplemental oxygen. Decreased global lung sounds. Abdominal: Distended with diffuse tenderness worst in RLQ. No rebound.  Extremities: 2+ pitting edema x4 extremities.  The results of significant diagnostics from this hospitalization (including imaging, microbiology, ancillary and laboratory) are listed below for reference.    Microbiology: Recent Results (from the past 240 hour(s))  Blood culture (routine x 2)     Status: None   Collection Time: 06/12/16 10:40 PM  Result Value Ref Range Status   Specimen Description BLOOD LEFT ARM  Final   Special Requests BOTTLES DRAWN AEROBIC AND ANAEROBIC 6ML  Final   Culture   Final    NO GROWTH 5 DAYS Performed at [facility]    Report Status 06/17/2016 FINAL  Final  Blood culture (routine x 2)     Status: None   Collection Time: 06/12/16 10:41 PM  Result Value Ref Range Status   Specimen Description BLOOD LEFT ANTECUBITAL  Final   Special Requests BOTTLES DRAWN AEROBIC AND ANAEROBIC 6ML  Final   Culture   Final    NO GROWTH 5  DAYS Performed at [facility]    Report Status 06/17/2016 FINAL  Final  MRSA PCR Screening     Status: None   Collection Time: 06/13/16  4:00 AM  Result Value Ref Range Status   MRSA by PCR NEGATIVE NEGATIVE Final    Comment:        The GeneXpert MRSA Assay (FDA approved for NASAL specimens only), is one component of a comprehensive MRSA colonization surveillance program. It is not intended to diagnose MRSA infection nor to guide or monitor treatment for MRSA infections.   Urine culture     Status: None   Collection Time: 06/13/16 12:49 PM  Result Value Ref Range Status   Specimen Description URINE, CLEAN CATCH  Final   Special Requests NONE  Final   Culture NO GROWTH Performed at [facility]   Final   Report Status 06/14/2016 FINAL  Final  C difficile quick scan w PCR reflex     Status: None   Collection Time: 06/13/16 12:49 PM  Result Value Ref Range Status   C Diff antigen NEGATIVE NEGATIVE Final   C Diff toxin NEGATIVE NEGATIVE Final   C Diff interpretation No C. difficile detected.  Final  Gram stain     Status: None   Collection Time: 06/20/16  9:11 AM  Result Value Ref Range Status   Specimen Description PERITONEAL  Final   Special Requests NONE  Final   Gram Stain   Final    FEW WBC PRESENT,BOTH PMN AND MONONUCLEAR NO ORGANISMS SEEN Performed at [facility]    Report Status 06/20/2016 FINAL  Final     Labs: BNP (last 3 results) No results for input(s): BNP in the last 8760 hours. Basic Metabolic Panel:  Recent Labs Lab 06/18/16 0519 06/18/16 1440 06/19/16 0512 06/20/16 0501 06/20/16 0504 06/21/16 0448 06/21/16 0450  NA 139 140 143 139  --   --  138  K 2.5* 3.7 3.1* 3.8  --   --  3.7  CL 107 109 110 107  --   --  106  CO2 26 28 27 27   --   --  28  GLUCOSE 124* 166* 132* 107*  --   --  100*  BUN 13 12 11 9   --   --  10  CREATININE 0.48* 0.51* 0.50* 0.50* 0.47*  --  0.53*  CALCIUM 7.4* 7.3* 7.3* 7.1*  --   --  7.0*  MG   --  1.7 1.7  --   --  1.7  --    Liver Function Tests:  Recent Labs Lab 06/15/16 0337 06/18/16 1440 06/20/16 0810  AST 16 20  --   ALT 11* 9*  --   ALKPHOS 75 87  --   BILITOT 0.6 0.5  --   PROT 5.0* 4.9* 5.0*  ALBUMIN 2.1* 1.9*  --    No results for input(s): LIPASE, AMYLASE in the last 168 hours. No results for input(s): AMMONIA in the last 168 hours. CBC:  Recent Labs Lab 06/15/16 0337 06/17/16 0343 06/18/16 0519 06/19/16 0512 06/21/16 0450  WBC 6.9 8.6 10.0 15.5* 21.5*  NEUTROABS 6.0 7.2 8.2* 13.4*  --   HGB 10.1* 10.7* 11.4* 11.4* 10.8*  HCT 31.7* 32.0* 33.7* 34.5* 33.7*  MCV 87.8 84.7 85.1 86.7 88.2  PLT 104* 117* 143* 178 252   Cardiac Enzymes: No results for input(s): CKTOTAL, CKMB, CKMBINDEX, TROPONINI in the last 168 hours. BNP: Invalid input(s): POCBNP CBG: No results for input(s): GLUCAP in the last 168 hours. D-Dimer No results for input(s): DDIMER in the last 72 hours. Hgb A1c No results for input(s): HGBA1C in the last 72 hours. Lipid Profile No results for input(s): CHOL, HDL, LDLCALC, TRIG, CHOLHDL, LDLDIRECT in the last 72 hours. Thyroid function studies No results for input(s): TSH, T4TOTAL, T3FREE, THYROIDAB in the last 72 hours.  Invalid input(s): FREET3 Anemia work up No results for input(s): VITAMINB12, FOLATE, FERRITIN, TIBC, IRON, RETICCTPCT in the last 72 hours. Urinalysis    Component Value Date/Time   COLORURINE AMBER (A) 06/13/2016 1248   APPEARANCEUR CLEAR 06/13/2016 1248   APPEARANCEUR Clear 08/29/2015 1110   LABSPEC 1.044 (H) 06/13/2016 1248   PHURINE 6.0 06/13/2016 1248   GLUCOSEU NEGATIVE 06/13/2016 1248   HGBUR NEGATIVE 06/13/2016 1248   BILIRUBINUR NEGATIVE 06/13/2016 1248   BILIRUBINUR Negative 08/29/2015 1110   KETONESUR NEGATIVE 06/13/2016 1248   PROTEINUR NEGATIVE 06/13/2016 1248   NITRITE NEGATIVE 06/13/2016 1248   LEUKOCYTESUR NEGATIVE 06/13/2016 1248   LEUKOCYTESUR Negative 08/29/2015 1110   Sepsis  Labs Invalid input(s): PROCALCITONIN,  WBC,  LACTICIDVEN Microbiology Recent Results (from the past 240 hour(s))  Blood culture (routine x 2)     Status: None   Collection Time: 06/12/16 10:40 PM  Result Value Ref Range Status   Specimen Description BLOOD LEFT ARM  Final   Special Requests BOTTLES DRAWN AEROBIC AND ANAEROBIC 6ML  Final   Culture   Final    NO GROWTH 5 DAYS Performed at [facility]    Report Status 06/17/2016 FINAL  Final  Blood culture (routine x 2)     Status: None   Collection Time: 06/12/16 10:41 PM  Result Value Ref Range Status   Specimen Description BLOOD LEFT ANTECUBITAL  Final   Special Requests BOTTLES DRAWN AEROBIC AND ANAEROBIC 6ML  Final   Culture   Final    NO GROWTH 5 DAYS Performed at [facility]    Report Status 06/17/2016 FINAL  Final  MRSA PCR Screening     Status: None  Collection Time: 06/13/16  4:00 AM  Result Value Ref Range Status   MRSA by PCR NEGATIVE NEGATIVE Final    Comment:        The GeneXpert MRSA Assay (FDA approved for NASAL specimens only), is one component of a comprehensive MRSA colonization surveillance program. It is not intended to diagnose MRSA infection nor to guide or monitor treatment for MRSA infections.   Urine culture     Status: None   Collection Time: 06/13/16 12:49 PM  Result Value Ref Range Status   Specimen Description URINE, CLEAN CATCH  Final   Special Requests NONE  Final   Culture NO GROWTH Performed at [facility]   Final   Report Status 06/14/2016 FINAL  Final  C difficile quick scan w PCR reflex     Status: None   Collection Time: 06/13/16 12:49 PM  Result Value Ref Range Status   C Diff antigen NEGATIVE NEGATIVE Final   C Diff toxin NEGATIVE NEGATIVE Final   C Diff interpretation No C. difficile detected.  Final  Gram stain     Status: None   Collection Time: 06/20/16  9:11 AM  Result Value Ref Range Status   Specimen Description PERITONEAL  Final   Special  Requests NONE  Final   Gram Stain   Final    FEW WBC PRESENT,BOTH PMN AND MONONUCLEAR NO ORGANISMS SEEN Performed at [facility]    Report Status 06/20/2016 FINAL  Final    Time coordinating discharge: Over 52 minutes  Vance Gather, MD  [facility] 06/21/2016, 11:57 AM Pager [phone]  If 7PM-7AM, please contact night-coverage [email or web address] Password [email or web address]

## 2016-06-23 ENCOUNTER — Telehealth: Payer: Self-pay | Admitting: *Deleted

## 2016-06-23 NOTE — Telephone Encounter (Signed)
Call placed to patient's sister, Butch Penny to check on patient's status.  Butch Penny states that patient is resting comfortably at the [facility].  She states that she will call the office if the patient is up to a visit with Dr. Benay Spice.  Encouraged Butch Penny to call [facility] any time with any questions or concerns per Dr. Benay Spice. Butch Penny is very appreciative of call and for Dr. Gearldine Shown excellent care of patient.

## 2016-06-25 ENCOUNTER — Telehealth: Payer: Self-pay | Admitting: Family Medicine

## 2016-06-25 ENCOUNTER — Encounter: Payer: Self-pay | Admitting: *Deleted

## 2016-06-25 LAB — CULTURE, BODY FLUID W GRAM STAIN -BOTTLE: Culture: NO GROWTH

## 2016-06-25 LAB — CULTURE, BODY FLUID-BOTTLE

## 2016-06-25 NOTE — Telephone Encounter (Signed)
Misty from [facility] called to notify that the pt passed away 07/11/16 @ 1:08am. Thanks.

## 2016-06-25 NOTE — Progress Notes (Signed)
Call received from [facility] in Medical Records stating that MR received a call from hospice stating that patient passed away on 07-22-17 at 1:08AM.  Dr. Benay Spice notified.

## 2016-06-29 ENCOUNTER — Other Ambulatory Visit: Payer: 59

## 2016-06-29 ENCOUNTER — Ambulatory Visit: Payer: 59 | Admitting: Nurse Practitioner

## 2016-06-29 ENCOUNTER — Ambulatory Visit: Payer: 59

## 2016-07-02 DEATH — deceased

## 2016-07-05 ENCOUNTER — Ambulatory Visit: Payer: 59 | Admitting: Nurse Practitioner

## 2016-07-05 ENCOUNTER — Other Ambulatory Visit: Payer: 59

## 2016-07-05 ENCOUNTER — Ambulatory Visit: Payer: 59

## 2016-07-10 ENCOUNTER — Other Ambulatory Visit: Payer: Self-pay | Admitting: Nurse Practitioner

## 2017-10-09 IMAGING — CT CT ABD-PELV W/ CM
2 of 5 series · 16 of 46 positions shown, 18 images · IV contrast (OMNIPAQUE 300)
Comparison: MRI, 09/06/2015

CLINICAL DATA: biliary stent placement about two weeks ago; and
underwent pancreatic biopsy Wed. (4 days ago). He is here today d/t
abd. Pain and fever today.

EXAM:
CT ABDOMEN AND PELVIS WITH CONTRAST
TECHNIQUE: Multidetector CT imaging of the abdomen and pelvis was performed
using the standard protocol following bolus administration of
intravenous contrast.
CONTRAST:  100mL OMNIPAQUE IOHEXOL 300 MG/ML SOLN, 25mL OMNIPAQUE
IOHEXOL 300 MG/ML SOLN

[Series 2: abd/pel with · axial · 0.80mm/px · z∈[-334,+80]mm · 13 of 93 slices shown, 15 images]
[im 5/93  soft-tissue]
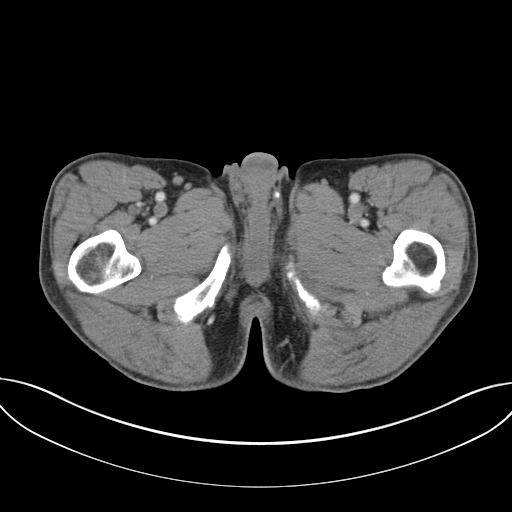
[im 5/93  bone]
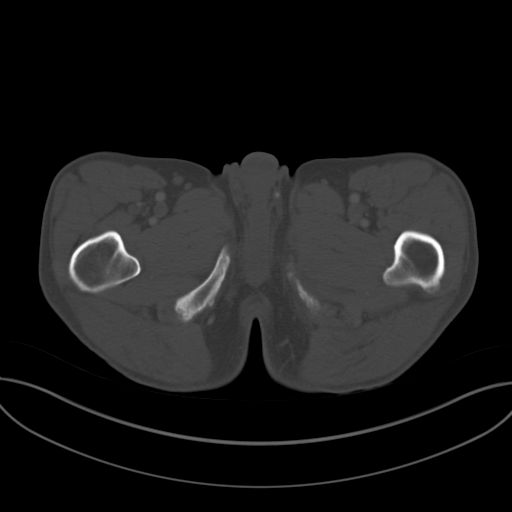
[im 15/93  soft-tissue]
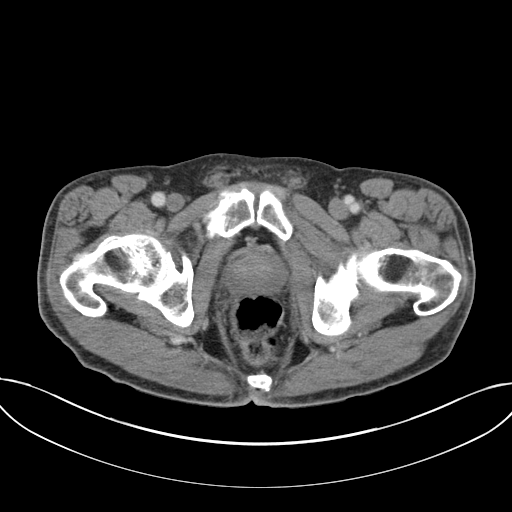
[im 20/93  soft-tissue]
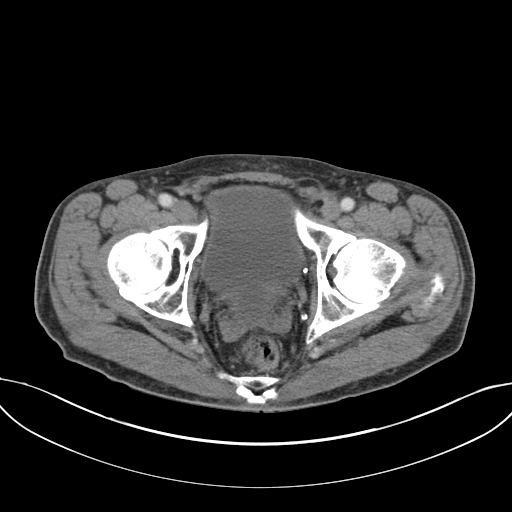
[im 25/93  soft-tissue]
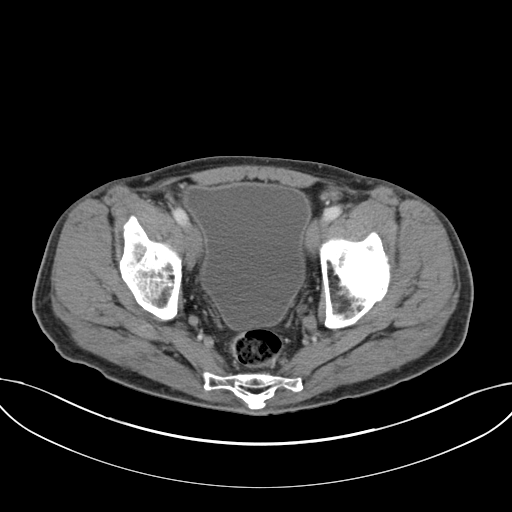
[im 34/93  soft-tissue]
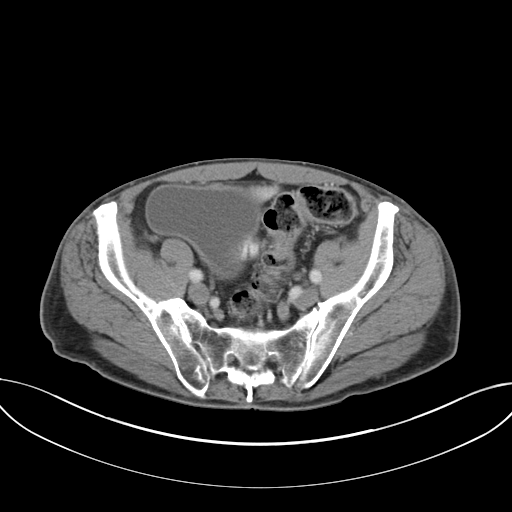
[im 39/93  soft-tissue]
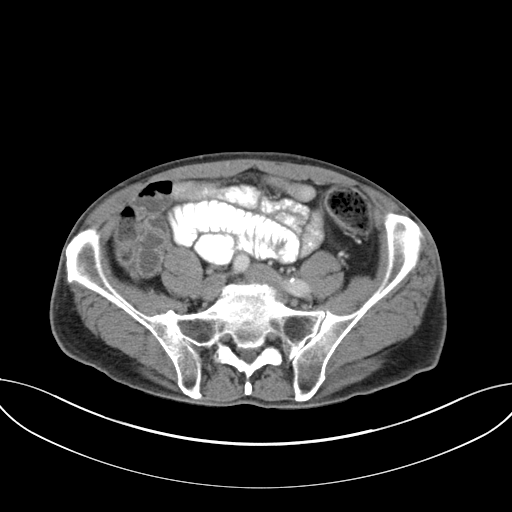
[im 49/93  soft-tissue]
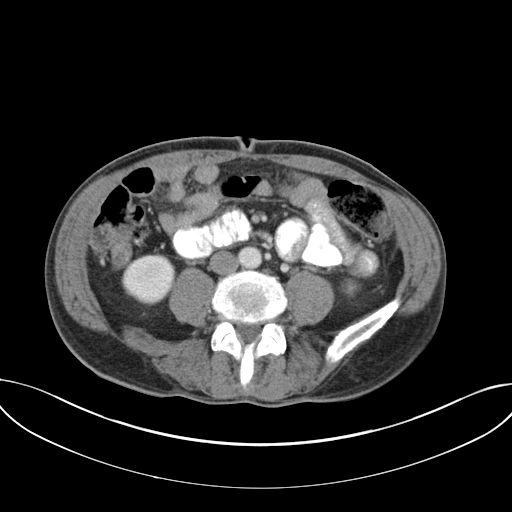
[im 54/93  soft-tissue]
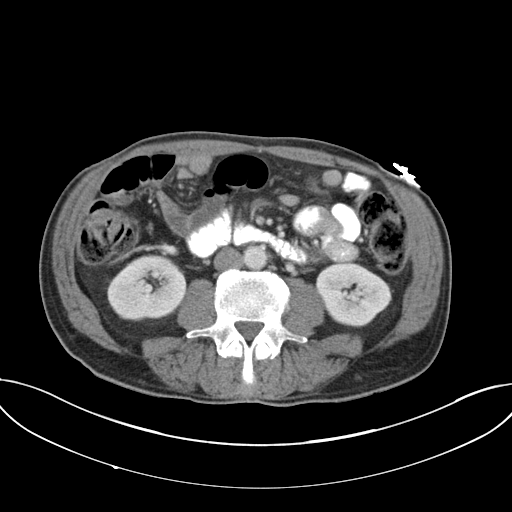
[im 59/93  soft-tissue]
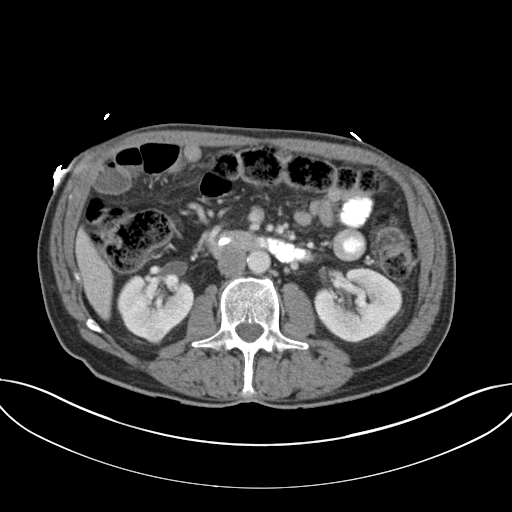
[im 59/93  bone]
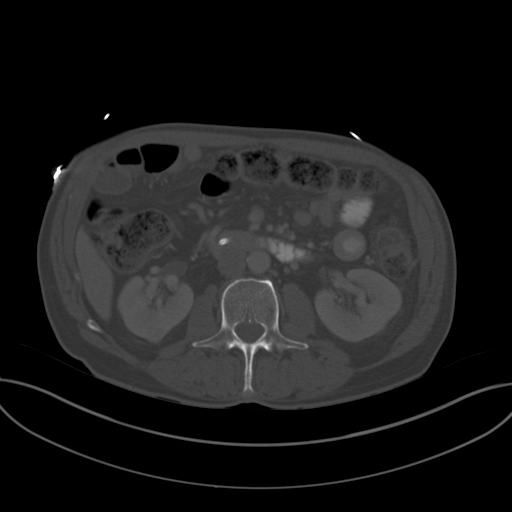
[im 68/93  soft-tissue]
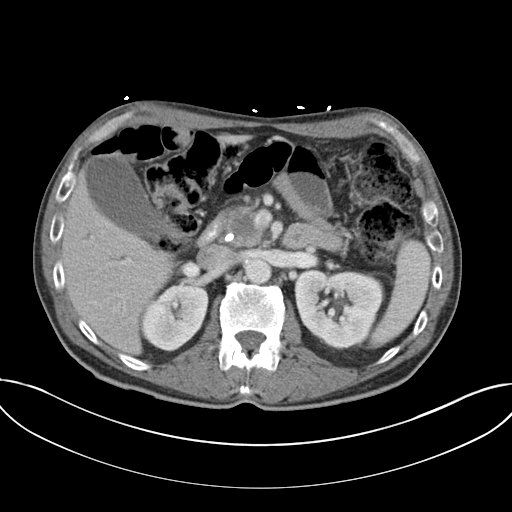
[im 73/93  soft-tissue]
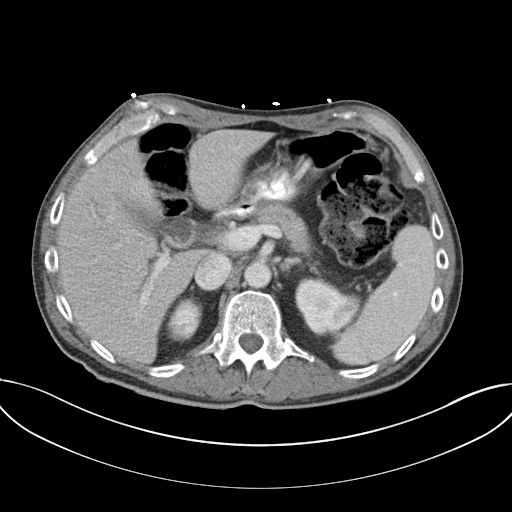
[im 78/93  soft-tissue]
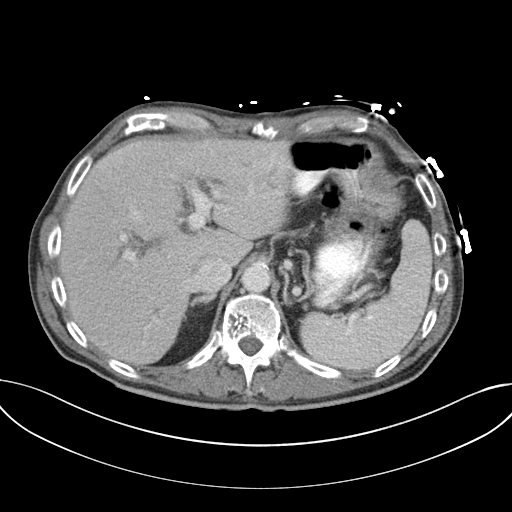
[im 88/93  soft-tissue]
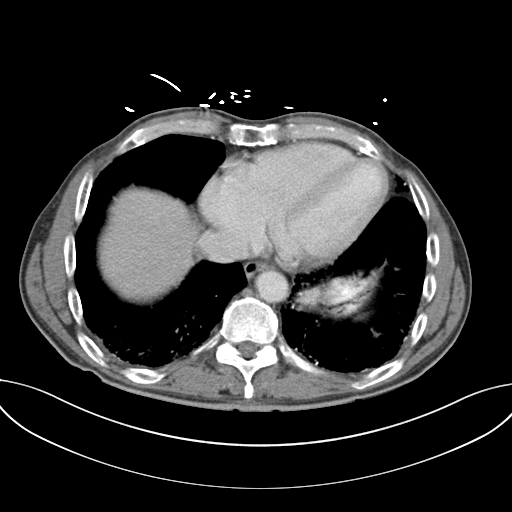

[Series 4: coronal a/|p · coronal · 0.74mm/px · 3 of 86 slices shown]
[im 29/86  soft-tissue]
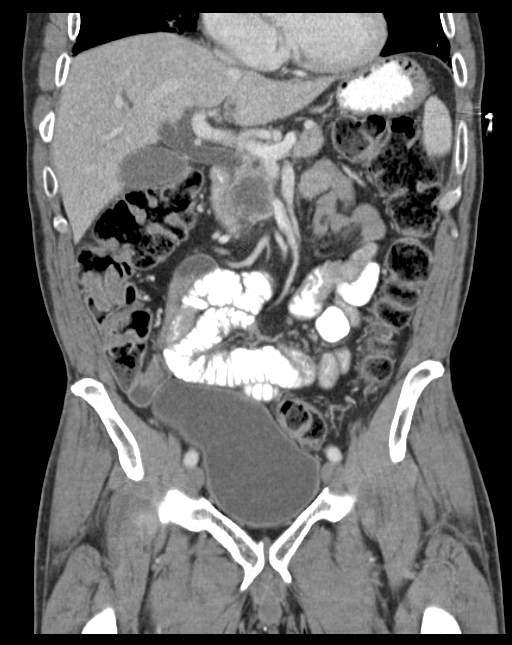
[im 38/86  soft-tissue]
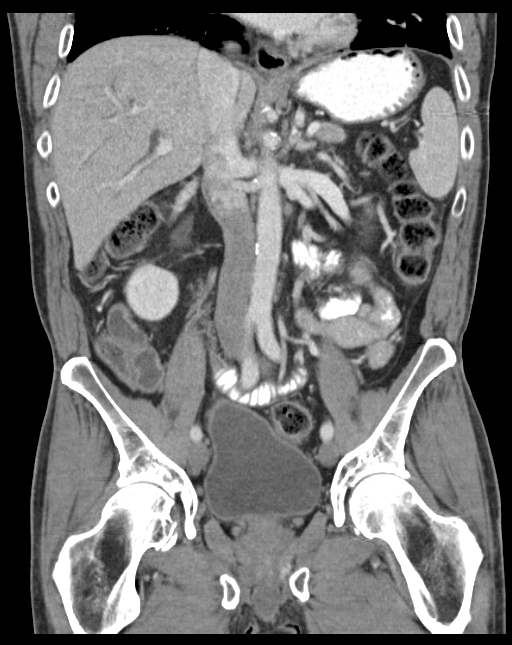
[im 48/86  soft-tissue]
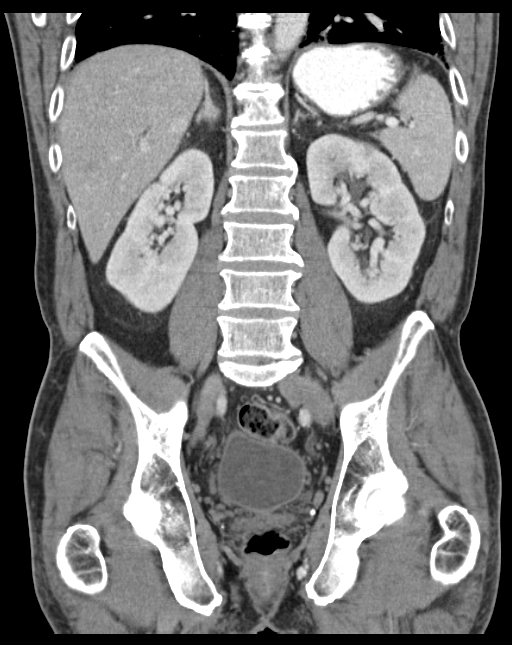

[16 of 46 positions shown; findings below may reference images not displayed]

FINDINGS: Lung bases: Heterogeneous areas of lung base interstitial fibrosis.
No evidence of pneumonia or edema. Heart normal in size.

Hepatobiliary: No liver mass or lesion. There is intra and
extrahepatic bile duct dilation despite the presence of a short
biliary stent. Stent extends from the mid to distal duct into the
third portion of the duodenum. Gallbladder is distended but
otherwise unremarkable.

Pancreas: 2.9 cm hypo attenuating pancreatic head mass unchanged
from the prior MRI. Mild dilation of pancreatic duct, also stable.
No pancreatic inflammation.

Spleen: Unremarkable.

Adrenal glands, kidneys, ureters, bladder: No adrenal masses. 14 mm
posterior right midpole renal mass. This appears to be a solid
enhancing mass. Small nonobstructing stone in the midpole the left
kidney. No other renal abnormality. Normal ureters. Bladder is
unremarkable.

Lymph nodes:  No adenopathy.

Ascites:  None.

Gastrointestinal: Stomach and small bowel are unremarkable. Mild
increased stool in the colon. Colon otherwise unremarkable. Normal
appendix visualized.

Musculoskeletal: Hemangioma in the T12 vertebrae with slight
depression of the upper endplate. Mild degenerative changes most
evident at L5-S1. No osteoblastic or osteolytic lesions.
IMPRESSION: 1. Despite the presence of the biliary stent, there is intra and
extrahepatic bile duct dilation. This suggests stent malfunction.
The biliary stent extends beyond the ampulla of Vater, and may have
migrated distally since its insertion. Tip of the stent projects in
the third portion of the duodenum.
2. 14 mm enhancing posterior midpole right renal mass. This is
suspicious for small renal cell carcinoma.
